# Patient Record
Sex: Female | Born: 1975 | Race: Black or African American | Hispanic: No | Smoking: Never smoker
Health system: Southern US, Community
[De-identification: ages and names within clinical notes are randomized; demographics above are authoritative.]

## PROBLEM LIST (undated history)

## (undated) DIAGNOSIS — F419 Anxiety disorder, unspecified: Secondary | ICD-10-CM

## (undated) DIAGNOSIS — K219 Gastro-esophageal reflux disease without esophagitis: Secondary | ICD-10-CM

## (undated) DIAGNOSIS — T7840XA Allergy, unspecified, initial encounter: Secondary | ICD-10-CM

## (undated) DIAGNOSIS — E785 Hyperlipidemia, unspecified: Secondary | ICD-10-CM

## (undated) DIAGNOSIS — F32A Depression, unspecified: Secondary | ICD-10-CM

## (undated) DIAGNOSIS — R011 Cardiac murmur, unspecified: Secondary | ICD-10-CM

## (undated) DIAGNOSIS — M1990 Unspecified osteoarthritis, unspecified site: Secondary | ICD-10-CM

## (undated) DIAGNOSIS — B009 Herpesviral infection, unspecified: Secondary | ICD-10-CM

## (undated) HISTORY — DX: Depression, unspecified: F32.A

## (undated) HISTORY — PX: ABDOMINAL HYSTERECTOMY: SHX81

## (undated) HISTORY — DX: Unspecified osteoarthritis, unspecified site: M19.90

## (undated) HISTORY — DX: Cardiac murmur, unspecified: R01.1

## (undated) HISTORY — PX: EYE SURGERY: SHX253

## (undated) HISTORY — DX: Gastro-esophageal reflux disease without esophagitis: K21.9

## (undated) HISTORY — DX: Anxiety disorder, unspecified: F41.9

## (undated) HISTORY — PX: PARTIAL HYSTERECTOMY: SHX80

## (undated) HISTORY — DX: Herpesviral infection, unspecified: B00.9

## (undated) HISTORY — PX: CHOLECYSTECTOMY: SHX55

## (undated) HISTORY — PX: TUBAL LIGATION: SHX77

## (undated) HISTORY — PX: OTHER SURGICAL HISTORY: SHX169

## (undated) HISTORY — DX: Allergy, unspecified, initial encounter: T78.40XA

## (undated) HISTORY — PX: TONSILLECTOMY: SUR1361

## (undated) HISTORY — PX: COSMETIC SURGERY: SHX468

## (undated) HISTORY — DX: Hyperlipidemia, unspecified: E78.5

---

## 2012-12-04 ENCOUNTER — Ambulatory Visit
Admission: RE | Admit: 2012-12-04 | Discharge: 2012-12-04 | Disposition: A | Discharge status: Home / Self Care | Payer: BC Managed Care – PPO | Source: Ambulatory Visit | Attending: Family | Admitting: Family

## 2012-12-04 ENCOUNTER — Other Ambulatory Visit: Payer: Self-pay

## 2012-12-04 ENCOUNTER — Other Ambulatory Visit: Payer: Self-pay | Admitting: Family

## 2012-12-04 DIAGNOSIS — Z1231 Encounter for screening mammogram for malignant neoplasm of breast: Secondary | ICD-10-CM

## 2012-12-04 DIAGNOSIS — T1490XA Injury, unspecified, initial encounter: Secondary | ICD-10-CM

## 2012-12-25 ENCOUNTER — Ambulatory Visit
Admission: RE | Admit: 2012-12-25 | Discharge: 2012-12-25 | Disposition: A | Discharge status: Home / Self Care | Payer: BC Managed Care – PPO | Source: Ambulatory Visit

## 2012-12-25 DIAGNOSIS — Z1231 Encounter for screening mammogram for malignant neoplasm of breast: Secondary | ICD-10-CM

## 2012-12-27 ENCOUNTER — Other Ambulatory Visit: Payer: Self-pay | Admitting: Family

## 2012-12-27 DIAGNOSIS — R928 Other abnormal and inconclusive findings on diagnostic imaging of breast: Secondary | ICD-10-CM

## 2013-01-19 ENCOUNTER — Ambulatory Visit
Admission: RE | Admit: 2013-01-19 | Discharge: 2013-01-19 | Disposition: A | Discharge status: Home / Self Care | Payer: BC Managed Care – PPO | Source: Ambulatory Visit | Attending: Family | Admitting: Family

## 2013-01-19 DIAGNOSIS — R928 Other abnormal and inconclusive findings on diagnostic imaging of breast: Secondary | ICD-10-CM

## 2013-07-02 DIAGNOSIS — N920 Excessive and frequent menstruation with regular cycle: Secondary | ICD-10-CM | POA: Insufficient documentation

## 2013-07-02 DIAGNOSIS — D219 Benign neoplasm of connective and other soft tissue, unspecified: Secondary | ICD-10-CM | POA: Insufficient documentation

## 2014-03-27 ENCOUNTER — Other Ambulatory Visit: Payer: Self-pay

## 2014-03-27 DIAGNOSIS — Z1231 Encounter for screening mammogram for malignant neoplasm of breast: Secondary | ICD-10-CM

## 2014-04-08 ENCOUNTER — Ambulatory Visit: Payer: Self-pay

## 2014-04-18 ENCOUNTER — Ambulatory Visit
Admission: RE | Admit: 2014-04-18 | Discharge: 2014-04-18 | Disposition: A | Discharge status: Home / Self Care | Payer: BLUE CROSS/BLUE SHIELD | Source: Ambulatory Visit

## 2014-04-18 ENCOUNTER — Other Ambulatory Visit: Payer: Self-pay

## 2014-04-18 DIAGNOSIS — Z1231 Encounter for screening mammogram for malignant neoplasm of breast: Secondary | ICD-10-CM

## 2015-08-06 ENCOUNTER — Other Ambulatory Visit: Payer: Self-pay | Admitting: Family

## 2015-08-06 DIAGNOSIS — Z1231 Encounter for screening mammogram for malignant neoplasm of breast: Secondary | ICD-10-CM

## 2015-08-18 ENCOUNTER — Ambulatory Visit
Admission: RE | Admit: 2015-08-18 | Discharge: 2015-08-18 | Disposition: A | Discharge status: Home / Self Care | Payer: BLUE CROSS/BLUE SHIELD | Source: Ambulatory Visit | Attending: Family | Admitting: Family

## 2015-08-18 DIAGNOSIS — Z1231 Encounter for screening mammogram for malignant neoplasm of breast: Secondary | ICD-10-CM

## 2015-08-21 ENCOUNTER — Other Ambulatory Visit: Payer: Self-pay | Admitting: Family

## 2015-08-21 DIAGNOSIS — R928 Other abnormal and inconclusive findings on diagnostic imaging of breast: Secondary | ICD-10-CM

## 2015-08-27 ENCOUNTER — Other Ambulatory Visit: Payer: Self-pay | Admitting: Family

## 2015-08-27 ENCOUNTER — Other Ambulatory Visit: Payer: Self-pay | Admitting: General Practice

## 2015-08-27 DIAGNOSIS — R928 Other abnormal and inconclusive findings on diagnostic imaging of breast: Secondary | ICD-10-CM

## 2015-08-28 ENCOUNTER — Ambulatory Visit
Admission: RE | Admit: 2015-08-28 | Discharge: 2015-08-28 | Disposition: A | Discharge status: Home / Self Care | Payer: BLUE CROSS/BLUE SHIELD | Source: Ambulatory Visit | Attending: Family | Admitting: Family

## 2015-08-28 DIAGNOSIS — R928 Other abnormal and inconclusive findings on diagnostic imaging of breast: Secondary | ICD-10-CM

## 2016-12-29 ENCOUNTER — Other Ambulatory Visit: Payer: Self-pay | Admitting: Family

## 2016-12-29 DIAGNOSIS — Z1231 Encounter for screening mammogram for malignant neoplasm of breast: Secondary | ICD-10-CM

## 2016-12-31 ENCOUNTER — Ambulatory Visit (INDEPENDENT_AMBULATORY_CARE_PROVIDER_SITE_OTHER): Payer: 59

## 2016-12-31 ENCOUNTER — Other Ambulatory Visit: Payer: Self-pay | Admitting: Podiatry

## 2016-12-31 ENCOUNTER — Ambulatory Visit: Payer: 59 | Admitting: Podiatry

## 2016-12-31 ENCOUNTER — Encounter: Payer: Self-pay | Admitting: Podiatry

## 2016-12-31 VITALS — BP 120/82 | HR 71

## 2016-12-31 DIAGNOSIS — M21619 Bunion of unspecified foot: Secondary | ICD-10-CM

## 2016-12-31 DIAGNOSIS — M2010 Hallux valgus (acquired), unspecified foot: Secondary | ICD-10-CM | POA: Diagnosis not present

## 2016-12-31 DIAGNOSIS — M79671 Pain in right foot: Secondary | ICD-10-CM

## 2016-12-31 DIAGNOSIS — M779 Enthesopathy, unspecified: Secondary | ICD-10-CM | POA: Diagnosis not present

## 2016-12-31 DIAGNOSIS — M79672 Pain in left foot: Secondary | ICD-10-CM

## 2016-12-31 MED ORDER — TRIAMCINOLONE ACETONIDE 10 MG/ML IJ SUSP
10.0000 mg | Freq: Once | INTRAMUSCULAR | Status: AC
  Administered 2016-12-31: 10 mg

## 2016-12-31 NOTE — Progress Notes (Signed)
Subjective:    Patient ID: Patricia French, female   DOB: 41 y.o.   MRN: [id number]   HPI patient presents with pain around the left big toe joint of several months duration and states it's been red on the inside and swollen. Patient is noted like to be active and is tried wider shoes and soaks    Review of Systems  All other systems reviewed and are negative.       Objective:  Physical Exam  Constitutional: She appears well-developed and well-nourished.  Cardiovascular: Intact distal pulses.  Pulmonary/Chest: Effort normal.  Musculoskeletal: Normal range of motion.  Neurological: She is alert.  Skin: Skin is warm.  Nursing note and vitals reviewed.  neurovascular status intact muscle strength adequate range of motion within normal limits with patient found to have inflammatory changes around the first MPJ left with fluid buildup around the joint surface and moderate structural changes left over right. 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 I do think this may require long-term structural bunion correction and that there is probably some nerve compression but today I'm in a try conservative and I injected around the first MPJ 3 mg Kenalog 5 mill grams Xylocaine and advised on wider shoes and reappoint for [place] to recheck  X-rays indicate elevation of the intermetatarsal angle left over right with indications of swelling around the first MPJ left

## 2016-12-31 NOTE — Progress Notes (Signed)
   Subjective:    Patient ID: Patricia French, female    DOB: [birth year], 40 y.o.   MRN: [id number]  HPI  Review of Systems  All other systems reviewed and are negative.      Objective:   Physical Exam        Assessment & Plan:

## 2016-12-31 NOTE — Patient Instructions (Signed)

## 2017-01-21 ENCOUNTER — Ambulatory Visit: Payer: 59 | Admitting: Podiatry

## 2017-01-27 ENCOUNTER — Ambulatory Visit: Payer: BLUE CROSS/BLUE SHIELD

## 2017-07-15 DIAGNOSIS — Z90710 Acquired absence of both cervix and uterus: Secondary | ICD-10-CM | POA: Insufficient documentation

## 2017-08-11 ENCOUNTER — Ambulatory Visit: Payer: 59 | Admitting: Podiatry

## 2017-08-11 ENCOUNTER — Encounter: Payer: Self-pay | Admitting: Podiatry

## 2017-08-11 ENCOUNTER — Ambulatory Visit (INDEPENDENT_AMBULATORY_CARE_PROVIDER_SITE_OTHER): Payer: 59

## 2017-08-11 ENCOUNTER — Other Ambulatory Visit: Payer: Self-pay | Admitting: Podiatry

## 2017-08-11 DIAGNOSIS — M2012 Hallux valgus (acquired), left foot: Secondary | ICD-10-CM

## 2017-08-11 DIAGNOSIS — M2010 Hallux valgus (acquired), unspecified foot: Secondary | ICD-10-CM

## 2017-08-11 DIAGNOSIS — M779 Enthesopathy, unspecified: Secondary | ICD-10-CM | POA: Diagnosis not present

## 2017-08-11 NOTE — Progress Notes (Signed)
Subjective:   Patient ID: Patricia French, female   DOB: 42 y.o.   MRN: [id number]   HPI Patient presents stating the bunion has really been bothering her on the left foot and it only responded temporarily to medication in the back of the Achilles tendon right has been bothering her and she had questions concerning it.  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  I did explain that keloiding can occur we will watch it carefully and may have to do injections or silicone sheeting we will do everything we can to avoid keloiding but most likely she will develop some form of this due to her history.  Patient understands this completely and wants surgery and at this point I did allow her to go over consent form reviewing the surgery and will be necessary.  Patient after extensive review signed consent form after reviewing alternative treatments complications scheduled for osteotomy distal left in about the next 4 to 6 weeks and is encouraged to call with any questions.  Understands total recovery take 6 months to 1 year and today I did go ahead and I dispensed a surgical boot but I want her to start wearing.  For the right will start exercises and stretching to try to help the Achilles tendon condition  X-rays indicate that there is elevation of the intermetatarsal angle left with  structural bunion deformity around the first metatarsal head left and no indications of spurring of the posterior right heel

## 2017-08-11 NOTE — Patient Instructions (Addendum)
Achilles Tendinitis  with Rehab Achilles tendinitis is a disorder of the Achilles tendon. The Achilles tendon connects the large calf muscles (Gastrocnemius and Soleus) to the heel bone (calcaneus). This tendon is sometimes called the heel cord. It is important for pushing-off and standing on your toes and is important for walking, running, or jumping. Tendinitis is often caused by overuse and repetitive microtrauma. SYMPTOMS  Pain, tenderness, swelling, warmth, and redness may occur over the Achilles tendon even at rest.  Pain with pushing off, or flexing or extending the ankle.  Pain that is worsened after or during activity. CAUSES   Overuse sometimes seen with rapid increase in exercise programs or in sports requiring running and jumping.  Poor physical conditioning (strength and flexibility or endurance).  Running sports, especially training running down hills.  Inadequate warm-up before practice or play or failure to stretch before participation.  Injury to the tendon. PREVENTION   Warm up and stretch before practice or competition.  Allow time for adequate rest and recovery between practices and competition.  Keep up conditioning.  Keep up ankle and leg flexibility.  Improve or keep muscle strength and endurance.  Improve cardiovascular fitness.  Use proper technique.  Use proper equipment (shoes, skates).  To help prevent recurrence, taping, protective strapping, or an adhesive bandage may be recommended for several weeks after healing is complete. PROGNOSIS   Recovery may take weeks to several months to heal.  Longer recovery is expected if symptoms have been prolonged.  Recovery is usually quicker if the inflammation is due to a direct blow as compared with overuse or sudden strain. RELATED COMPLICATIONS   Healing time will be prolonged if the condition is not correctly treated. The injury must be given plenty of time to heal.  Symptoms can reoccur if  activity is resumed too soon.  Untreated, tendinitis may increase the risk of tendon rupture requiring additional time for recovery and possibly surgery. TREATMENT   The first treatment consists of rest anti-inflammatory medication, and ice to relieve the pain.  Stretching and strengthening exercises after resolution of pain will likely help reduce the risk of recurrence. Referral to a physical therapist or athletic trainer for further evaluation and treatment may be helpful.  A walking boot or cast may be recommended to rest the Achilles tendon. This can help break the cycle of inflammation and microtrauma.  Arch supports (orthotics) may be prescribed or recommended by your caregiver as an adjunct to therapy and rest.  Surgery to remove the inflamed tendon lining or degenerated tendon tissue is rarely necessary and has shown less than predictable results. MEDICATION   Nonsteroidal anti-inflammatory medications, such as aspirin and ibuprofen, may be used for pain and inflammation relief. Do not take within 7 days before surgery. Take these as directed by your caregiver. Contact your caregiver immediately if any bleeding, stomach upset, or signs of allergic reaction occur. Other minor pain relievers, such as acetaminophen, may also be used.  Pain relievers may be prescribed as necessary by your caregiver. Do not take prescription pain medication for longer than 4 to 7 days. Use only as directed and only as much as you need.  Cortisone injections are rarely indicated. Cortisone injections may weaken tendons and predispose to rupture. It is better to give the condition more time to heal than to use them. HEAT AND COLD  Cold is used to relieve pain and reduce inflammation for acute and chronic Achilles tendinitis. Cold should be applied for 10 to 15 minutes   every 2 to 3 hours for inflammation and pain and immediately after any activity that aggravates your symptoms. Use ice packs or an ice  massage.  Heat may be used before performing stretching and strengthening activities prescribed by your caregiver. Use a heat pack or a warm soak. SEEK MEDICAL CARE IF:  Symptoms get worse or do not improve in 2 weeks despite treatment.  New, unexplained symptoms develop. Drugs used in treatment may produce side effects.  EXERCISES:  RANGE OF MOTION (ROM) AND STRETCHING EXERCISES - Achilles Tendinitis  These exercises may help you when beginning to rehabilitate your injury. Your symptoms may resolve with or without further involvement from your physician, physical therapist or athletic trainer. While completing these exercises, remember:   Restoring tissue flexibility helps normal motion to return to the joints. This allows healthier, less painful movement and activity.  An effective stretch should be held for at least 30 seconds.  A stretch should never be painful. You should only feel a gentle lengthening or release in the stretched tissue.  STRETCH  Gastroc, Standing   Place hands on wall.  Extend right / left leg, keeping the front knee somewhat bent.  Slightly point your toes inward on your back foot.  Keeping your right / left heel on the floor and your knee straight, shift your weight toward the wall, not allowing your back to arch.  You should feel a gentle stretch in the right / left calf. Hold this position for 10 seconds. Repeat 3 times. Complete this stretch 2 times per day.  STRETCH  Soleus, Standing   Place hands on wall.  Extend right / left leg, keeping the other knee somewhat bent.  Slightly point your toes inward on your back foot.  Keep your right / left heel on the floor, bend your back knee, and slightly shift your weight over the back leg so that you feel a gentle stretch deep in your back calf.  Hold this position for 10 seconds. Repeat 3 times. Complete this stretch 2 times per day.  STRETCH  Gastrocsoleus, Standing  Note: This exercise can place  a lot of stress on your foot and ankle. Please complete this exercise only if specifically instructed by your caregiver.   Place the ball of your right / left foot on a step, keeping your other foot firmly on the same step.  Hold on to the wall or a rail for balance.  Slowly lift your other foot, allowing your body weight to press your heel down over the edge of the step.  You should feel a stretch in your right / left calf.  Hold this position for 10 seconds.  Repeat this exercise with a slight bend in your knee. Repeat 3 times. Complete this stretch 2 times per day.   STRENGTHENING EXERCISES - Achilles Tendinitis These exercises may help you when beginning to rehabilitate your injury. They may resolve your symptoms with or without further involvement from your physician, physical therapist or athletic trainer. While completing these exercises, remember:   Muscles can gain both the endurance and the strength needed for everyday activities through controlled exercises.  Complete these exercises as instructed by your physician, physical therapist or athletic trainer. Progress the resistance and repetitions only as guided.  You may experience muscle soreness or fatigue, but the pain or discomfort you are trying to eliminate should never worsen during these exercises. If this pain does worsen, stop and make certain you are following the directions exactly. If   the pain is still present after adjustments, discontinue the exercise until you can discuss the trouble with your clinician.  STRENGTH - Plantar-flexors   Sit with your right / left leg extended. Holding onto both ends of a rubber exercise band/tubing, loop it around the ball of your foot. Keep a slight tension in the band.  Slowly push your toes away from you, pointing them downward.  Hold this position for 10 seconds. Return slowly, controlling the tension in the band/tubing. Repeat 3 times. Complete this exercise 2 times per day.     STRENGTH - Plantar-flexors   Stand with your feet shoulder width apart. Steady yourself with a wall or table using as little support as needed.  Keeping your weight evenly spread over the width of your feet, rise up on your toes.*  Hold this position for 10 seconds. Repeat 3 times. Complete this exercise 2 times per day.  *If this is too easy, shift your weight toward your right / left leg until you feel challenged. Ultimately, you may be asked to do this exercise with your right / left foot only.  STRENGTH  Plantar-flexors, Eccentric  Note: This exercise can place a lot of stress on your foot and ankle. Please complete this exercise only if specifically instructed by your caregiver.   Place the balls of your feet on a step. With your hands, use only enough support from a wall or rail to keep your balance.  Keep your knees straight and rise up on your toes.  Slowly shift your weight entirely to your right / left toes and pick up your opposite foot. Gently and with controlled movement, lower your weight through your right / left foot so that your heel drops below the level of the step. You will feel a slight stretch in the back of your calf at the end position.  Use the healthy leg to help rise up onto the balls of both feet, then lower weight only on the right / left leg again. Build up to 15 repetitions. Then progress to 3 consecutive sets of 15 repetitions.*  After completing the above exercise, complete the same exercise with a slight knee bend (about 30 degrees). Again, build up to 15 repetitions. Then progress to 3 consecutive sets of 15 repetitions.* Perform this exercise 2 times per day.  *When you easily complete 3 sets of 15, your physician, physical therapist or athletic trainer may advise you to add resistance by wearing a backpack filled with additional weight.  STRENGTH - Plantar Flexors, Seated   Sit on a chair that allows your feet to rest flat on the ground. If  necessary, sit at the edge of the chair.  Keeping your toes firmly on the ground, lift your right / left heel as far as you can without increasing any discomfort in your ankle. Repeat 3 times. Complete this exercise 2 times a day.    Bunion A bunion is a bump on the base of the big toe that forms when the bones of the big toe joint move out of position. Bunions may be small at first, but they often get larger over time. The can make walking painful. What are the causes? A bunion may be caused by:  Wearing narrow or pointed shoes that force the big toe to press against the other toes.  Abnormal foot development that causes the foot to roll inward (pronate).  Changes in the foot that are caused by certain diseases, such as rheumatoid arthritis and  polio.  A foot injury.  What increases the risk? The following factors may make you more likely to develop this condition:  Wearing shoes that squeeze the toes together.  Having certain diseases, such as: ? Rheumatoid arthritis. ? Polio. ? Cerebral palsy.  Having family members who have bunions.  Being born with a foot deformity, such as flat feet or low arches.  Doing activities that put a lot of pressure on the feet, such as ballet dancing.  What are the signs or symptoms? The main symptom of a bunion is a noticeable bump on the big toe. Other symptoms may include:  Pain.  Swelling around the big toe.  Redness and inflammation.  Thick or hardened skin on the big toe or between the toes.  Stiffness or loss of motion in the big toe.  Trouble with walking.  How is this diagnosed? A bunion may be diagnosed based on your symptoms, medical history, and activities. You may have tests, such as:  X-rays. These allow your health care provider to check the position of the bones in your foot and look for damage to your joint. They also help your health care provider to determine the severity of your bunion and the best way to treat  it.  Joint aspiration. In this test, a sample of fluid is removed from the toe joint. This test, which may be done if you are in a lot of pain, helps to rule out diseases that cause painful swelling of the joints, such as arthritis.  How is this treated? There is no cure for a bunion, but treatment can help to prevent a bunion from getting worse. Treatment depends on the severity of your symptoms. Your health care provider may recommend:  Wearing shoes that have a wide toe box.  Using bunion pads to cushion the affected area.  Taping your toes together to keep them in a normal position.  Placing a device inside your shoe (orthotics) to help reduce pressure on your toe joint.  Taking medicine to ease pain, inflammation, and swelling.  Applying heat or ice to the affected area.  Doing stretching exercises.  Surgery to remove scar tissue and move the toes back into their normal position. This treatment is rare.  Follow these instructions at home:  Support your toe joint with proper footwear, shoe padding, or taping as told by your health care provider.  Take over-the-counter and prescription medicines only as told by your health care provider.  If directed, apply ice to the injured area: ? Put ice in a plastic bag. ? Place a towel between your skin and the bag. ? Leave the ice on for 20 minutes, 2-3 times per day.  If directed, apply heat to the affected area before you exercise. Use the heat source that your health care provider recommends, such as a moist heat pack or a heating pad. ? Place a towel between your skin and the heat source. ? Leave the heat on for 20-30 minutes. ? Remove the heat if your skin turns bright red. This is especially important if you are unable to feel pain, heat, or cold. You may have a greater risk of getting burned.  Do exercises as told by your health care provider.  Keep all follow-up visits as told by your health care provider. Contact a health  care provider if:  Your symptoms get worse.  Your symptoms do not improve in 2 weeks. Get help right away if:  You have severe pain and  trouble with walking. This information is not intended to replace advice given to you by your health care provider. Make sure you discuss any questions you have with your health care provider. Document Released: 02/08/2005 Document Revised: 07/17/2015 Document Reviewed: 09/08/2014 Elsevier Interactive Patient Education  2018 [place] Instructions  Congratulations, you have decided to take an important step towards improving your quality of life.  You can be assured that the doctors and staff at [facility] will be with you every step of the way.  Here are some important things you should know:  1. Plan to be at the surgery center/hospital at least 1 (one) hour prior to your scheduled time, unless otherwise directed by the surgical center/hospital staff.  You must have a responsible adult accompany you, remain during the surgery and drive you home.  Make sure you have directions to the surgical center/hospital to ensure you arrive on time. 2. If you are having surgery at [facility] or [facility], you will need a copy of your medical history and physical form from your family physician within one month prior to the date of surgery. We will give you a form for your primary physician to complete.  3. We make every effort to accommodate the date you request for surgery.  However, there are times where surgery dates or times have to be moved.  We will contact you as soon as possible if a change in schedule is required.   4. No aspirin/ibuprofen for one week before surgery.  If you are on aspirin, any non-steroidal anti-inflammatory medications (Mobic, Aleve, Ibuprofen) should not be taken seven (7) days prior to your surgery.  You make take Tylenol for pain prior to surgery.  5. Medications - If you are taking daily heart and  blood pressure medications, seizure, reflux, allergy, asthma, anxiety, pain or diabetes medications, make sure you notify the surgery center/hospital before the day of surgery so they can tell you which medications you should take or avoid the day of surgery. 6. No food or drink after midnight the night before surgery unless directed otherwise by surgical center/hospital staff. 7. No alcoholic beverages 32-TFTDD prior to surgery.  No smoking 24-hours prior or 24-hours after surgery. 8. Wear loose pants or shorts. They should be loose enough to fit over bandages, boots, and casts. 9. Don't wear slip-on shoes. Sneakers are preferred. 10. Bring your boot with you to the surgery center/hospital.  Also bring crutches or a walker if your physician has prescribed it for you.  If you do not have this equipment, it will be provided for you after surgery. 11. If you have not been contacted by the surgery center/hospital by the day before your surgery, call to confirm the date and time of your surgery. 12. Leave-time from work may vary depending on the type of surgery you have.  Appropriate arrangements should be made prior to surgery with your employer. 13. Prescriptions will be provided immediately following surgery by your doctor.  Fill these as soon as possible after surgery and take the medication as directed. Pain medications will not be refilled on weekends and must be approved by the doctor. 14. Remove nail polish on the operative foot and avoid getting pedicures prior to surgery. 15. Wash the night before surgery.  The night before surgery wash the foot and leg well with water and the antibacterial soap provided. Be sure to pay special attention to beneath the toenails and in between the  toes.  Wash for at least three (3) minutes. Rinse thoroughly with water and dry well with a towel.  Perform this wash unless told not to do so by your physician.  Enclosed: 1 Ice pack (please put in freezer the night  before surgery)   1 Hibiclens skin cleaner   Pre-op instructions  If you have any questions regarding the instructions, please do not hesitate to call our office.  [place]: [address] [place], [place] [address] -- [place]: [address] [place], [place] [address] -- [phone]  [place]: [place], [place] [address] -- [phone]  [place]: [address], [place], [place], [place] [address] -- [phone]  Website: [email or web address].  Achilles Tendinitis  with Rehab Achilles tendinitis is a disorder of the Achilles tendon. The Achilles tendon connects the large calf muscles (Gastrocnemius and Soleus) to the heel bone (calcaneus). This tendon is sometimes called the heel cord. It is important for pushing-off and standing on your toes and is important for walking, running, or jumping. Tendinitis is often caused by overuse and repetitive microtrauma. SYMPTOMS  Pain, tenderness, swelling, warmth, and redness may occur over the Achilles tendon even at rest.  Pain with pushing off, or flexing or extending the ankle.  Pain that is worsened after or during activity. CAUSES   Overuse sometimes seen with rapid increase in exercise programs or in sports requiring running and jumping.  Poor physical conditioning (strength and flexibility or endurance).  Running sports, especially training running down hills.  Inadequate warm-up before practice or play or failure to stretch before participation.  Injury to the tendon. PREVENTION   Warm up and stretch before practice or competition.  Allow time for adequate rest and recovery between practices and competition.  Keep up conditioning.  Keep up ankle and leg flexibility.  Improve or keep muscle strength and endurance.  Improve cardiovascular fitness.  Use proper technique.  Use proper equipment (shoes, skates).  To help prevent recurrence, taping, protective strapping, or an  adhesive bandage may be recommended for several weeks after healing is complete. PROGNOSIS   Recovery may take weeks to several months to heal.  Longer recovery is expected if symptoms have been prolonged.  Recovery is usually quicker if the inflammation is due to a direct blow as compared with overuse or sudden strain. RELATED COMPLICATIONS   Healing time will be prolonged if the condition is not correctly treated. The injury must be given plenty of time to heal.  Symptoms can reoccur if activity is resumed too soon.  Untreated, tendinitis may increase the risk of tendon rupture requiring additional time for recovery and possibly surgery. TREATMENT   The first treatment consists of rest anti-inflammatory medication, and ice to relieve the pain.  Stretching and strengthening exercises after resolution of pain will likely help reduce the risk of recurrence. Referral to a physical therapist or athletic trainer for further evaluation and treatment may be helpful.  A walking boot or cast may be recommended to rest the Achilles tendon. This can help break the cycle of inflammation and microtrauma.  Arch supports (orthotics) may be prescribed or recommended by your caregiver as an adjunct to therapy and rest.  Surgery to remove the inflamed tendon lining or degenerated tendon tissue is rarely necessary and has shown less than predictable results. MEDICATION   Nonsteroidal anti-inflammatory medications, such as aspirin and ibuprofen, may be used for pain and inflammation relief. Do not take within 7 days before surgery. Take these  as directed by your caregiver. Contact your caregiver immediately if any bleeding, stomach upset, or signs of allergic reaction occur. Other minor pain relievers, such as acetaminophen, may also be used.  Pain relievers may be prescribed as necessary by your caregiver. Do not take prescription pain medication for longer than 4 to 7 days. Use only as directed and only  as much as you need.  Cortisone injections are rarely indicated. Cortisone injections may weaken tendons and predispose to rupture. It is better to give the condition more time to heal than to use them. HEAT AND COLD  Cold is used to relieve pain and reduce inflammation for acute and chronic Achilles tendinitis. Cold should be applied for 10 to 15 minutes every 2 to 3 hours for inflammation and pain and immediately after any activity that aggravates your symptoms. Use ice packs or an ice massage.  Heat may be used before performing stretching and strengthening activities prescribed by your caregiver. Use a heat pack or a warm soak. SEEK MEDICAL CARE IF:  Symptoms get worse or do not improve in 2 weeks despite treatment.  New, unexplained symptoms develop. Drugs used in treatment may produce side effects.  EXERCISES:  RANGE OF MOTION (ROM) AND STRETCHING EXERCISES - Achilles Tendinitis  These exercises may help you when beginning to rehabilitate your injury. Your symptoms may resolve with or without further involvement from your physician, physical therapist or athletic trainer. While completing these exercises, remember:   Restoring tissue flexibility helps normal motion to return to the joints. This allows healthier, less painful movement and activity.  An effective stretch should be held for at least 30 seconds.  A stretch should never be painful. You should only feel a gentle lengthening or release in the stretched tissue.  STRETCH  Gastroc, Standing   Place hands on wall.  Extend right / left leg, keeping the front knee somewhat bent.  Slightly point your toes inward on your back foot.  Keeping your right / left heel on the floor and your knee straight, shift your weight toward the wall, not allowing your back to arch.  You should feel a gentle stretch in the right / left calf. Hold this position for 10 seconds. Repeat 3 times. Complete this stretch 2 times per day.  STRETCH   Soleus, Standing   Place hands on wall.  Extend right / left leg, keeping the other knee somewhat bent.  Slightly point your toes inward on your back foot.  Keep your right / left heel on the floor, bend your back knee, and slightly shift your weight over the back leg so that you feel a gentle stretch deep in your back calf.  Hold this position for 10 seconds. Repeat 3 times. Complete this stretch 2 times per day.  STRETCH  Gastrocsoleus, Standing  Note: This exercise can place a lot of stress on your foot and ankle. Please complete this exercise only if specifically instructed by your caregiver.   Place the ball of your right / left foot on a step, keeping your other foot firmly on the same step.  Hold on to the wall or a rail for balance.  Slowly lift your other foot, allowing your body weight to press your heel down over the edge of the step.  You should feel a stretch in your right / left calf.  Hold this position for 10 seconds.  Repeat this exercise with a slight bend in your knee. Repeat 3 times. Complete this stretch 2  times per day.   STRENGTHENING EXERCISES - Achilles Tendinitis These exercises may help you when beginning to rehabilitate your injury. They may resolve your symptoms with or without further involvement from your physician, physical therapist or athletic trainer. While completing these exercises, remember:   Muscles can gain both the endurance and the strength needed for everyday activities through controlled exercises.  Complete these exercises as instructed by your physician, physical therapist or athletic trainer. Progress the resistance and repetitions only as guided.  You may experience muscle soreness or fatigue, but the pain or discomfort you are trying to eliminate should never worsen during these exercises. If this pain does worsen, stop and make certain you are following the directions exactly. If the pain is still present after adjustments,  discontinue the exercise until you can discuss the trouble with your clinician.  STRENGTH - Plantar-flexors   Sit with your right / left leg extended. Holding onto both ends of a rubber exercise band/tubing, loop it around the ball of your foot. Keep a slight tension in the band.  Slowly push your toes away from you, pointing them downward.  Hold this position for 10 seconds. Return slowly, controlling the tension in the band/tubing. Repeat 3 times. Complete this exercise 2 times per day.   STRENGTH - Plantar-flexors   Stand with your feet shoulder width apart. Steady yourself with a wall or table using as little support as needed.  Keeping your weight evenly spread over the width of your feet, rise up on your toes.*  Hold this position for 10 seconds. Repeat 3 times. Complete this exercise 2 times per day.  *If this is too easy, shift your weight toward your right / left leg until you feel challenged. Ultimately, you may be asked to do this exercise with your right / left foot only.  STRENGTH  Plantar-flexors, Eccentric  Note: This exercise can place a lot of stress on your foot and ankle. Please complete this exercise only if specifically instructed by your caregiver.   Place the balls of your feet on a step. With your hands, use only enough support from a wall or rail to keep your balance.  Keep your knees straight and rise up on your toes.  Slowly shift your weight entirely to your right / left toes and pick up your opposite foot. Gently and with controlled movement, lower your weight through your right / left foot so that your heel drops below the level of the step. You will feel a slight stretch in the back of your calf at the end position.  Use the healthy leg to help rise up onto the balls of both feet, then lower weight only on the right / left leg again. Build up to 15 repetitions. Then progress to 3 consecutive sets of 15 repetitions.*  After completing the above exercise,  complete the same exercise with a slight knee bend (about 30 degrees). Again, build up to 15 repetitions. Then progress to 3 consecutive sets of 15 repetitions.* Perform this exercise 2 times per day.  *When you easily complete 3 sets of 15, your physician, physical therapist or athletic trainer may advise you to add resistance by wearing a backpack filled with additional weight.  STRENGTH - Plantar Flexors, Seated   Sit on a chair that allows your feet to rest flat on the ground. If necessary, sit at the edge of the chair.  Keeping your toes firmly on the ground, lift your right / left heel as far  as you can without increasing any discomfort in your ankle. Repeat 3 times. Complete this exercise 2 times a day.

## 2017-09-30 ENCOUNTER — Telehealth: Payer: Self-pay | Admitting: *Deleted

## 2017-09-30 ENCOUNTER — Telehealth: Payer: Self-pay | Admitting: Podiatry

## 2017-09-30 NOTE — Telephone Encounter (Signed)
I just spoke to someone and I have a concern. I was thinking and I know you said you would call me back Monday but there is a penalty I thought I read somewhere to reschedule surgery so many hours prior to the date of surgery. I think it may be best for me to reschedule because it sounds like I'm going to be out longer than I was informed about and I just cannot do that right now with my job. I'm still getting my time approved, I had it all worked out. So if possible, can you please call me back today at [phone] and let me know. I need to avoid having to pay some type of penalty for the surgery as I cannot do that either. So if possible, please call me back today. Thank you.

## 2017-09-30 NOTE — Telephone Encounter (Signed)
I spoke with pt and she states she was concerned that Dr. Paulla Dolly had said she could go back to seated duties at work on the Monday 10/10/2017 after her 10/04/2017 surgery, and another lady said she could not go back until after seen at her 1st POV on 10/12/2017. I told pt as a general rule pts were not released until evaluated by Dr. Paulla Dolly unless they had sit down jobs and he had okayed previously. 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 I reminded pt she would have pain medications and she asked aren't they as needed. I told pt in most cases pts take the pain medications as ordered. I told her if she was on the pain medication, she could not drive, operate machinery or make legal decisions. Pt states she thinks she will be okay she had a hysterectomy and 3 kids. I told her then what Dr. Paulla Dolly had told her is what he felt would work for her. I asked how she would do if she found that she was too uncomfortable to work, would her employer accommodate her and she stated yes.

## 2017-09-30 NOTE — Telephone Encounter (Signed)
I need someone to call me back today and I do realize its late in the afternoon. I just talked to a nurse and I think I need to reschedule my surgery. I don't want to be penalized base on what she was telling me today, I received some misinformation somewhere along the line. Can you please call me back at [phone]. Thank you.

## 2017-10-03 NOTE — Telephone Encounter (Signed)
Patient called on Friday and left a message about how long would she be out of work from the surgery and I stated that you could be out for a while and the patient stated that Dr Paulla Dolly stated to her that she would be able to go back to work on Monday after the surgery and I stated that she would be out of work for at least a week for recovery and that the first post op visit was not till Wednesday August 21st and the patient stated that was not was told to her and I stated that I would check with Dr Paulla Dolly on Monday and would call patient back on Monday. Lattie Haw

## 2017-10-04 ENCOUNTER — Encounter: Payer: Self-pay | Admitting: Podiatry

## 2017-10-04 DIAGNOSIS — M2012 Hallux valgus (acquired), left foot: Secondary | ICD-10-CM

## 2017-10-12 ENCOUNTER — Ambulatory Visit (INDEPENDENT_AMBULATORY_CARE_PROVIDER_SITE_OTHER): Payer: 59 | Admitting: Podiatry

## 2017-10-12 ENCOUNTER — Ambulatory Visit (INDEPENDENT_AMBULATORY_CARE_PROVIDER_SITE_OTHER): Payer: 59

## 2017-10-12 VITALS — BP 126/79 | HR 85 | Temp 98.2°F

## 2017-10-12 DIAGNOSIS — M2012 Hallux valgus (acquired), left foot: Secondary | ICD-10-CM

## 2017-10-12 DIAGNOSIS — M2010 Hallux valgus (acquired), unspecified foot: Secondary | ICD-10-CM | POA: Diagnosis not present

## 2017-10-12 NOTE — Progress Notes (Signed)
Subjective:   Patient ID: Patricia French, female   DOB: 42 y.o.   MRN: [id number]   HPI Patient presents stating doing well but admits she did walk around the house with out any form of immobilization   ROS      Objective:  Physical Exam  Neurovascular status intact muscle strength is adequate patient's left foot healing well clinically wound edges well coapted hallux in rectus position with good range of motion and minimal discomfort.  Patient was relatively noncompliant by walking around without her immobilization but other than that appears to be healing well     Assessment:  Doing well overall post osteotomy first metatarsal left with mild increase in activity beyond what we had hoped for     Plan:  Reviewed x-rays and discussed there may be slight movement dorsally but I think it will be stable at this position but is very important she does not walk on this foot without complete immobilization.  Patient will continue with elevation compression range of motion be seen back 2 weeks or earlier if needed  X-ray indicates the osteotomy overall is healing well with excellent correction of the IM angle but there is slight dorsiflexion probably due to activity and I do want to keep a little closer eye on this even though I think it would be stable at this position.  Reappoint 2 weeks or earlier if any issues occur

## 2017-10-17 ENCOUNTER — Telehealth: Payer: Self-pay | Admitting: Podiatry

## 2017-10-17 DIAGNOSIS — Z9889 Other specified postprocedural states: Secondary | ICD-10-CM

## 2017-10-17 DIAGNOSIS — M2010 Hallux valgus (acquired), unspecified foot: Secondary | ICD-10-CM

## 2017-10-17 NOTE — Telephone Encounter (Signed)
I'm having sharp pains that are shooting through my big toe. I don't know if that is normal or what? I had a bunionectomy two weeks ago. If someone could please call me back at [phone]. Thank you.

## 2017-10-17 NOTE — Telephone Encounter (Signed)
Pt states she is having a sharp pain in her big toe, just happens no activity involved. I told pt that was not unusual, that she may be more active than she thought. I told pt to back off on the activity. I asked pt what she was using for pain and she stated ibuprofen as needed. I told pt she could use ibuprofen 800mg  3 times a day and it would help with the pain. Pt asked for a knee scooter, she is going out of town, and I told her I would order one from [facility] and the would contact her with coverage and cost.

## 2017-10-17 NOTE — Addendum Note (Signed)
Addended by: Harriett Sine D on: 10/17/2017 02:38 PM   Modules accepted: Orders

## 2017-10-17 NOTE — Telephone Encounter (Signed)
Faxed orders to [facility] and emailed order to [facility].

## 2017-10-20 ENCOUNTER — Telehealth: Payer: Self-pay | Admitting: Podiatry

## 2017-10-20 NOTE — Telephone Encounter (Signed)
I spoke to a nurse the other day and she said she was going to have someone from [facility] call me about getting a piece of equipment that we are taking. We are leaving tomorrow and I have not been contacted by anyone. If someone could give me a call today at [phone]. Thank you.

## 2017-10-20 NOTE — Telephone Encounter (Signed)
Emailed request for knee scooter to [facility].

## 2017-10-20 NOTE — Telephone Encounter (Signed)
I spoke with pt and she states [facility] just called about the knee scooter.

## 2017-10-27 ENCOUNTER — Encounter: Payer: Self-pay | Admitting: Podiatry

## 2017-10-27 ENCOUNTER — Ambulatory Visit (INDEPENDENT_AMBULATORY_CARE_PROVIDER_SITE_OTHER): Payer: 59 | Admitting: Podiatry

## 2017-10-27 ENCOUNTER — Ambulatory Visit (INDEPENDENT_AMBULATORY_CARE_PROVIDER_SITE_OTHER): Payer: 59

## 2017-10-27 DIAGNOSIS — M2012 Hallux valgus (acquired), left foot: Secondary | ICD-10-CM

## 2017-10-27 DIAGNOSIS — Z9889 Other specified postprocedural states: Secondary | ICD-10-CM

## 2017-10-27 DIAGNOSIS — M2010 Hallux valgus (acquired), unspecified foot: Secondary | ICD-10-CM

## 2017-12-01 ENCOUNTER — Ambulatory Visit (INDEPENDENT_AMBULATORY_CARE_PROVIDER_SITE_OTHER): Payer: 59 | Admitting: Podiatry

## 2017-12-01 ENCOUNTER — Encounter: Payer: Self-pay | Admitting: Podiatry

## 2017-12-01 ENCOUNTER — Ambulatory Visit (INDEPENDENT_AMBULATORY_CARE_PROVIDER_SITE_OTHER): Payer: 59

## 2017-12-01 DIAGNOSIS — M2012 Hallux valgus (acquired), left foot: Secondary | ICD-10-CM | POA: Diagnosis not present

## 2017-12-01 DIAGNOSIS — M2010 Hallux valgus (acquired), unspecified foot: Secondary | ICD-10-CM

## 2017-12-02 NOTE — Progress Notes (Signed)
Subjective:   Patient ID: Patricia French, female   DOB: 42 y.o.   MRN: [id number]   HPI Patient states overall doing very well with mild discomfort if I been on my foot too long   ROS      Objective:  Physical Exam  Neurovascular status intact with patient's left foot doing well with mild discomfort still upon deep palpation but overall improved quite nicely from previous visit with good range of motion and no crepitus     Assessment:  Doing well post osteotomy first metatarsal left with some movement she had previously secondary to patient walking on her foot without any immobilization the first week after procedure     Plan:  Reviewed x-ray and advised on continued compression elevation and rigid bottom shoes and gradual increase in activities and reappoint 4 weeks or earlier if needed  X-ray indicated that there is some movement of the first metatarsal but it is stable from previous visit with fixation in place and this did occur secondary to noncompliance the patient the first week after procedure

## 2017-12-29 ENCOUNTER — Ambulatory Visit (INDEPENDENT_AMBULATORY_CARE_PROVIDER_SITE_OTHER): Payer: 59 | Admitting: Podiatry

## 2017-12-29 ENCOUNTER — Encounter: Payer: Self-pay | Admitting: Podiatry

## 2017-12-29 ENCOUNTER — Ambulatory Visit (INDEPENDENT_AMBULATORY_CARE_PROVIDER_SITE_OTHER): Payer: 59

## 2017-12-29 DIAGNOSIS — M2010 Hallux valgus (acquired), unspecified foot: Secondary | ICD-10-CM

## 2017-12-29 DIAGNOSIS — M2012 Hallux valgus (acquired), left foot: Secondary | ICD-10-CM | POA: Diagnosis not present

## 2018-01-01 NOTE — Progress Notes (Signed)
Subjective:   Patient ID: Patricia French, female   DOB: 42 y.o.   MRN: [id number]   HPI Patient presents stating I am doing real well with surgery with a little bit of pain overall I am extremely pleased with my surgery of 3 months ago   ROS      Objective:  Physical Exam  Neurovascular status was intact negative Homans sign noted with good range of motion first MPJ left with wound edges well coapted and no crepitus of the joint     Assessment:  Doing well post correction of first metatarsal left     Plan:  H&P final x-ray reviewed and allow patient to return to normal activity.  Patient will be seen back for review in the next 12 weeks or earlier if symptoms should occur and patient is advised to increase activity levels  X-rays indicate that the osteotomy is healing well with good alignment fixation in place

## 2018-02-02 ENCOUNTER — Encounter: Payer: Self-pay | Admitting: Podiatry

## 2018-02-02 ENCOUNTER — Ambulatory Visit: Payer: 59 | Admitting: Podiatry

## 2018-02-02 ENCOUNTER — Ambulatory Visit (INDEPENDENT_AMBULATORY_CARE_PROVIDER_SITE_OTHER): Payer: 59

## 2018-02-02 VITALS — BP 140/89 | HR 79 | Temp 98.7°F | Resp 16

## 2018-02-02 DIAGNOSIS — Z9889 Other specified postprocedural states: Secondary | ICD-10-CM

## 2018-02-02 DIAGNOSIS — M2010 Hallux valgus (acquired), unspecified foot: Secondary | ICD-10-CM

## 2018-02-02 DIAGNOSIS — M2012 Hallux valgus (acquired), left foot: Secondary | ICD-10-CM | POA: Diagnosis not present

## 2018-02-03 NOTE — Progress Notes (Signed)
Subjective:   Patient ID: Patricia French, female   DOB: 42 y.o.   MRN: [id number]   HPI Patient states doing really well with minimal discomfort good range of motion and wound edges are well coapted with slight thickening the incision with history of keloids on her stomach   ROS      Objective:  Physical Exam  Neurovascular status intact patient doing well with no discomfort noted within the first MPJ and very mild thickness the incision     Assessment:  Doing well post osteotomy first metatarsal left     Plan:  Final x-ray is viewed and at this point we may consider some form of steroid injection to try to thin out the scarring but at this point is not hurting so we will just watch it and give it a couple more months and if it is a problem she will reappoint.  She is discharged and that she needs to be seen back and can return to normal activity  X-ray indicates osteotomies healing well fixation in place no signs of significant pathology

## 2018-04-03 DIAGNOSIS — E7800 Pure hypercholesterolemia, unspecified: Secondary | ICD-10-CM | POA: Insufficient documentation

## 2018-04-03 DIAGNOSIS — E559 Vitamin D deficiency, unspecified: Secondary | ICD-10-CM | POA: Insufficient documentation

## 2018-04-14 ENCOUNTER — Telehealth: Payer: Self-pay | Admitting: *Deleted

## 2018-04-14 NOTE — Telephone Encounter (Signed)
Pt states she has a flat mole with pointed area on the bottom of the foot, had it cut out, but is having pain now at the surgery site. I told pt that if the pain was at the surgery site she needed to return to the doctor that cut it out. Pt then stated surgery was 10 years ago. I told pt then Dr. Paulla Dolly could evaluate and if necessary treat or refer to the dermatologist. I transferred pt to scheduler.

## 2018-04-14 NOTE — Telephone Encounter (Signed)
Left message informing pt I was returning her call.

## 2018-04-14 NOTE — Telephone Encounter (Signed)
Pt states she is calling concerning a pain she is having in her foot and did she need to see Dr. Paulla Dolly or another doctor.

## 2018-04-14 NOTE — Telephone Encounter (Signed)
Pt called she states she has an question.

## 2018-04-14 NOTE — Telephone Encounter (Signed)
Left message informing pt that Dr. Paulla Dolly specialized in any problem or disease of the foot and ankle, to call our appt line for an appt.

## 2018-04-20 ENCOUNTER — Ambulatory Visit: Payer: 59 | Admitting: Podiatry

## 2018-05-10 ENCOUNTER — Ambulatory Visit: Payer: 59 | Admitting: Podiatry

## 2018-06-07 ENCOUNTER — Ambulatory Visit: Payer: 59 | Admitting: Podiatry

## 2018-09-06 ENCOUNTER — Ambulatory Visit: Payer: 59 | Admitting: Podiatry

## 2019-01-17 ENCOUNTER — Other Ambulatory Visit: Payer: Self-pay

## 2019-01-17 DIAGNOSIS — Z20822 Contact with and (suspected) exposure to covid-19: Secondary | ICD-10-CM

## 2019-01-19 LAB — NOVEL CORONAVIRUS, NAA: SARS-CoV-2, NAA: NOT DETECTED

## 2019-10-30 DIAGNOSIS — M2669 Other specified disorders of temporomandibular joint: Secondary | ICD-10-CM | POA: Insufficient documentation

## 2019-10-30 DIAGNOSIS — G44209 Tension-type headache, unspecified, not intractable: Secondary | ICD-10-CM | POA: Insufficient documentation

## 2020-01-08 ENCOUNTER — Ambulatory Visit: Payer: Self-pay | Attending: Internal Medicine

## 2020-01-08 DIAGNOSIS — Z23 Encounter for immunization: Secondary | ICD-10-CM

## 2020-01-08 NOTE — Progress Notes (Signed)
   Covid-19 Vaccination Clinic  Name:  Patricia French    MRN: [id number] DOB: 1975/12/28  01/08/2020  [name] was observed post Covid-19 immunization for 15 minutes without incident. She was provided with Vaccine Information Sheet and instruction to access the V-Safe system.   [name] was instructed to call 911 with any severe reactions post vaccine: Marland Kitchen Difficulty breathing  . Swelling of face and throat  . A fast heartbeat  . A bad rash all over body  . Dizziness and weakness   Immunizations Administered    No immunizations on file.

## 2020-06-04 ENCOUNTER — Ambulatory Visit: Payer: 59 | Admitting: Nurse Practitioner

## 2020-06-04 ENCOUNTER — Other Ambulatory Visit: Payer: Self-pay

## 2020-06-04 ENCOUNTER — Encounter: Payer: Self-pay | Admitting: Nurse Practitioner

## 2020-06-04 VITALS — BP 132/82 | HR 84 | Temp 98.8°F | Ht 63.5 in | Wt 180.0 lb

## 2020-06-04 DIAGNOSIS — Z1211 Encounter for screening for malignant neoplasm of colon: Secondary | ICD-10-CM | POA: Diagnosis not present

## 2020-06-04 DIAGNOSIS — F419 Anxiety disorder, unspecified: Secondary | ICD-10-CM

## 2020-06-04 DIAGNOSIS — M79605 Pain in left leg: Secondary | ICD-10-CM | POA: Diagnosis not present

## 2020-06-04 DIAGNOSIS — M79604 Pain in right leg: Secondary | ICD-10-CM

## 2020-06-04 DIAGNOSIS — Z7689 Persons encountering health services in other specified circumstances: Secondary | ICD-10-CM

## 2020-06-04 MED ORDER — BUPROPION HCL ER (SR) 100 MG PO TB12: 100.0000 mg | ORAL_TABLET | Freq: Two times a day (BID) | ORAL | 2 refills | Status: DC

## 2020-06-04 MED ORDER — MAGNESIUM 250 MG PO TABS: 1.0000 | ORAL_TABLET | Freq: Every day | ORAL | 1 refills | Status: AC

## 2020-06-04 MED ORDER — BUPROPION HCL ER (SR) 100 MG PO TB12: 100.0000 mg | ORAL_TABLET | Freq: Every day | ORAL | 2 refills | Status: DC

## 2020-06-04 NOTE — Progress Notes (Signed)
I,Yamilka Roman [employer] as a Education administrator for [employer], FNP.,have documented all relevant documentation on the behalf of [name], FNP,as directed by  [name], FNP while in the presence of [name], [place]. This visit occurred during the SARS-CoV-2 public health emergency.  Safety protocols were in place, including screening questions prior to the visit, additional usage of staff PPE, and extensive cleaning of exam room while observing appropriate contact time as indicated for disinfecting solutions.  Subjective:     Patient ID: Patricia French , female    DOB: 1975-11-18 , 45 y.o.   MRN: [id number]   Chief Complaint  Patient presents with  . Establish Care  . Back Pain  . Leg Pain    HPI  Patient presents today to establish primary care. She would like to be evaluated for back and leg pain.  She had been at [facility] with Priscille Loveless FNP.  She works as a Teacher, music. Divorced. 69, 68 y/o (girls), and 16 y/o (girls).    PMH - anxiety, partial hysterectomy [facility])   Father - deceased - Alzheimer's. Maternal grandmother - breast cancer. 3 of her paternal aunts have alzheimers. She is in a study at [employer] for Alzheimers. She has an older sister with behavioral health problems.   She had been taking xanax daily until being prescribed Lexapro. She stopped taking due to having twitching of her left shoulder. She did notice it helped with her mood and she was feeling better. She was prescribed 10 mg daily but only took 1/2 tablet. She reports the twitching stopped after stopping. She took for 6 weeks.   Back Pain This is a new problem. The current episode started in the past 7 days. The problem occurs intermittently. The pain is present in the lumbar spine. Associated symptoms include leg pain (worse at night). Pertinent negatives include no chest pain or headaches.  Leg Pain  The quality of the pain is described as aching. Exacerbated by:  laying down at night. She has tried NSAIDs for the symptoms.  Anxiety Presents for initial visit. Patient reports no chest pain, dizziness or palpitations.       Past Medical History:  Diagnosis Date  . HSV-2 (herpes simplex virus 2) infection   . Hyperlipidemia      Family History  Problem Relation Age of Onset  . Diabetes Mother   . Hypertension Mother   . Hyperlipidemia Mother   . Heart disease Mother   . Varicose Veins Mother   . Anxiety disorder Mother   . Heart attack Father   . Bipolar disorder Father   . Schizophrenia Father   . Hyperlipidemia Father   . Alzheimer's disease Father   . Cancer Maternal Grandmother   . Migraines Maternal Grandmother   . Hypertension Maternal Grandmother   . Alzheimer's disease Paternal Grandmother   . Leukemia Paternal Grandfather      Current Outpatient Medications:  .  ALPRAZolam (XANAX) 0.25 MG tablet, Take 0.25 mg at bedtime as needed by mouth for anxiety., Disp: , Rfl:  .  Ascorbic Acid (VITAMIN C) 1000 MG tablet, Take by mouth., Disp: , Rfl:  .  estradiol (VIVELLE-DOT) 0.075 MG/24HR, Place 1 patch onto the skin daily at 6 (six) AM., Disp: , Rfl:  .  Magnesium 250 MG TABS, Take 1 tablet (250 mg total) by mouth daily. Take with evening meal, Disp: 90 tablet, Rfl: 1 .  rosuvastatin (CRESTOR) 5 MG tablet, Take 5 mg by  mouth daily at 6 (six) AM., Disp: , Rfl:  .  valACYclovir (VALTREX) 500 MG tablet, Take 500 mg by mouth daily at 6 (six) AM., Disp: , Rfl:  .  Vitamin D, Ergocalciferol, (DRISDOL) 1.25 MG (50000 UNIT) CAPS capsule, Take 1 capsule by mouth daily at 6 (six) AM., Disp: , Rfl:  .  buPROPion (WELLBUTRIN SR) 100 MG 12 hr tablet, Take 1 tablet (100 mg total) by mouth daily., Disp: 30 tablet, Rfl: 2   Allergies  Allergen Reactions  . Penicillins Hives  . Pork-Derived Products      Review of Systems  Constitutional: Negative.   Respiratory: Negative.  Negative for wheezing.   Cardiovascular: Negative.  Negative for  chest pain, palpitations and leg swelling.  Musculoskeletal: Positive for arthralgias (leg pain) and back pain.  Neurological: Negative.  Negative for dizziness and headaches.  Psychiatric/Behavioral: Negative.      Today's Vitals   06/04/20 1502  BP: 132/82  Pulse: 84  Temp: 98.8 F (37.1 C)  TempSrc: Oral  Weight: 180 lb (81.6 kg)  Height: 5' 3.5" (1.613 m)  PainSc: 4   PainLoc: Leg   Body mass index is 31.39 kg/m.   Objective:  Physical Exam Constitutional:      General: She is not in acute distress.    Appearance: Normal appearance.  Cardiovascular:     Rate and Rhythm: Normal rate and regular rhythm.     Pulses: Normal pulses.     Heart sounds: Normal heart sounds. No murmur heard.   Pulmonary:     Effort: Pulmonary effort is normal. No respiratory distress.     Breath sounds: Normal breath sounds. No wheezing.  Musculoskeletal:        General: No swelling or tenderness. Normal range of motion.     Right lower leg: No edema.     Left lower leg: No edema.  Skin:    Capillary Refill: Capillary refill takes less than 2 seconds.  Neurological:     General: No focal deficit present.     Mental Status: She is alert and oriented to person, place, and time.     Cranial Nerves: No cranial nerve deficit.  Psychiatric:        Mood and Affect: Mood normal.        Behavior: Behavior normal.        Thought Content: Thought content normal.        Judgment: Judgment normal.         Assessment And Plan:     1. Anxiety Will try her on wellbutrin, hopefully this helps with sleep and anxiety Discussed side effect profile to include risk for seizures - buPROPion (WELLBUTRIN SR) 100 MG 12 hr tablet; Take 1 tablet (100 mg total) by mouth daily.  Dispense: 30 tablet; Refill: 2 - BMP8+eGFR  2. Pain in both lower extremities  Will check vitamin b12 and encouraged to take magnesium at evening meal - Magnesium 250 MG TABS; Take 1 tablet (250 mg total) by mouth daily. Take  with evening meal  Dispense: 90 tablet; Refill: 1 - Vitamin B12  3. Establishing care with new doctor, encounter for  4. Encounter for screening colonoscopy  According to USPTF Colorectal cancer Screening guidelines. Colonoscopy is recommended every 10 years, starting at age 76 years.  Will refer to GI for colon cancer screening. - Ambulatory referral to Gastroenterology     Patient was given opportunity to ask questions. Patient verbalized understanding of the plan and was able  to repeat key elements of the plan. All questions were answered to their satisfaction.  [name], FNP   I, [name], FNP, have reviewed all documentation for this visit. The documentation on 06/04/20 for the exam, diagnosis, procedures, and orders are all accurate and complete.   IF YOU HAVE BEEN REFERRED TO A SPECIALIST, IT MAY TAKE 1-2 WEEKS TO SCHEDULE/PROCESS THE REFERRAL. IF YOU HAVE NOT HEARD FROM US/SPECIALIST IN TWO WEEKS, PLEASE GIVE [place] A CALL AT [phone].   THE PATIENT IS ENCOURAGED TO PRACTICE SOCIAL DISTANCING DUE TO THE COVID-19 PANDEMIC.

## 2020-06-05 LAB — BMP8+EGFR
BUN/Creatinine Ratio: 14 (ref 9–23)
BUN: 9 mg/dL (ref 6–24)
CO2: 22 mmol/L (ref 20–29)
Calcium: 9.8 mg/dL (ref 8.7–10.2)
Chloride: 101 mmol/L (ref 96–106)
Creatinine, Ser: 0.63 mg/dL (ref 0.57–1.00)
Glucose: 76 mg/dL (ref 65–99)
Potassium: 4.2 mmol/L (ref 3.5–5.2)
Sodium: 144 mmol/L (ref 134–144)
eGFR: 111 mL/min/{1.73_m2} (ref >59)

## 2020-06-05 LAB — VITAMIN B12: Vitamin B-12: 955 pg/mL (ref 232–1245)

## 2020-06-30 ENCOUNTER — Encounter: Payer: Self-pay | Admitting: Nurse Practitioner

## 2020-07-17 ENCOUNTER — Ambulatory Visit: Payer: 59 | Admitting: Nurse Practitioner

## 2020-08-12 ENCOUNTER — Other Ambulatory Visit: Payer: Self-pay

## 2020-08-12 ENCOUNTER — Encounter: Payer: Self-pay | Admitting: Nurse Practitioner

## 2020-08-12 ENCOUNTER — Ambulatory Visit: Payer: 59 | Admitting: Nurse Practitioner

## 2020-08-12 VITALS — BP 122/80 | HR 71 | Temp 98.1°F | Ht 63.5 in | Wt 182.6 lb

## 2020-08-12 DIAGNOSIS — M25512 Pain in left shoulder: Secondary | ICD-10-CM | POA: Diagnosis not present

## 2020-08-12 DIAGNOSIS — G8929 Other chronic pain: Secondary | ICD-10-CM

## 2020-08-12 DIAGNOSIS — F419 Anxiety disorder, unspecified: Secondary | ICD-10-CM | POA: Diagnosis not present

## 2020-08-12 MED ORDER — PREDNISONE 10 MG (21) PO TBPK: ORAL_TABLET | ORAL | 0 refills | Status: DC

## 2020-08-12 NOTE — Progress Notes (Signed)
I,Yamilka Roman [employer] as a Education administrator for [employer], FNP.,have documented all relevant documentation on the behalf of [name], FNP,as directed by  [name], FNP while in the presence of [name], [place].   This visit occurred during the SARS-CoV-2 public health emergency.  Safety protocols were in place, including screening questions prior to the visit, additional usage of staff PPE, and extensive cleaning of exam room while observing appropriate contact time as indicated for disinfecting solutions.  Subjective:     Patient ID: Patricia French , female    DOB: [birth year] , 45 y.o.   MRN: [id number]   Chief Complaint  Patient presents with   Anxiety   Shoulder Pain    HPI  Patient presents today for a f/u on anxiety.  She took one pill of the wellbutrin and stopped due to her shoulder jumping. She is concerned about tardive dyskinesia. She has only taken xanax 2 times since her last visit. She does go to counseling.   Three years ago had shoulder problems to both, had injections and now having left shoulder pain. Never had any xrays.   Anxiety Presents for initial visit. Onset was 6 to 12 months ago. Patient reports no chest pain, dizziness or palpitations.    Back Pain This is a new problem. The current episode started in the past 7 days. The problem occurs intermittently. The pain is present in the lumbar spine. Associated symptoms include leg pain (worse at night). Pertinent negatives include no chest pain or headaches.  Leg Pain  The quality of the pain is described as aching. Exacerbated by: laying down at night. She has tried NSAIDs for the symptoms.    Past Medical History:  Diagnosis Date   HSV-2 (herpes simplex virus 2) infection    Hyperlipidemia      Family History  Problem Relation Age of Onset   Diabetes Mother    Hypertension Mother    Hyperlipidemia Mother    Heart disease Mother    Varicose Veins Mother    Anxiety disorder Mother    Heart  attack Father    Bipolar disorder Father    Schizophrenia Father    Hyperlipidemia Father    Alzheimer's disease Father    Cancer Maternal Grandmother    Migraines Maternal Grandmother    Hypertension Maternal Grandmother    Alzheimer's disease Paternal Grandmother    Leukemia Paternal Grandfather      Current Outpatient Medications:    ALPRAZolam (XANAX) 0.25 MG tablet, Take 0.25 mg at bedtime as needed by mouth for anxiety., Disp: , Rfl:    Ascorbic Acid (VITAMIN C) 1000 MG tablet, Take by mouth., Disp: , Rfl:    buPROPion (WELLBUTRIN SR) 100 MG 12 hr tablet, Take 1 tablet (100 mg total) by mouth daily., Disp: 30 tablet, Rfl: 2   estradiol (VIVELLE-DOT) 0.075 MG/24HR, Place 1 patch onto the skin daily at 6 (six) AM., Disp: , Rfl:    Magnesium 250 MG TABS, Take 1 tablet (250 mg total) by mouth daily. Take with evening meal, Disp: 90 tablet, Rfl: 1   predniSONE (STERAPRED UNI-PAK 21 TAB) 10 MG (21) TBPK tablet, Take as directed, Disp: 21 tablet, Rfl: 0   rosuvastatin (CRESTOR) 5 MG tablet, Take 5 mg by mouth daily at 6 (six) AM., Disp: , Rfl:    valACYclovir (VALTREX) 500 MG tablet, Take 500 mg by mouth daily at 6 (six) AM., Disp: , Rfl:    Vitamin D, Ergocalciferol, (DRISDOL) 1.25 MG (50000 UNIT)  CAPS capsule, Take 1 capsule by mouth daily at 6 (six) AM., Disp: , Rfl:    Allergies  Allergen Reactions   Penicillins Hives   Pork-Derived Products      Review of Systems  Constitutional: Negative.   Respiratory: Negative.    Cardiovascular:  Negative for chest pain and palpitations.  Musculoskeletal:  Positive for back pain.       Left shoulder pain  Neurological:  Negative for dizziness and headaches.  Psychiatric/Behavioral: Negative.         Stopped taking wellbutrin due to concerns of tardive dykinesia    Today's Vitals   08/12/20 1609  BP: 122/80  Pulse: 71  Temp: 98.1 F (36.7 C)  Weight: 182 lb 9.6 oz (82.8 kg)  Height: 5' 3.5" (1.613 m)   Body mass index is 31.84  kg/m.   Objective:  Physical Exam Vitals reviewed.  Constitutional:      General: She is not in acute distress.    Appearance: Normal appearance.  Cardiovascular:     Pulses: Normal pulses.     Heart sounds: Normal heart sounds. No murmur heard. Pulmonary:     Effort: Pulmonary effort is normal. No respiratory distress.     Breath sounds: Normal breath sounds. No wheezing.  Musculoskeletal:        General: Tenderness (left anterior bursa of shoulder) present. No swelling. Normal range of motion.     Right lower leg: No edema.     Left lower leg: No edema.     Comments: No pain on palpation to back  Skin:    General: Skin is warm and dry.     Capillary Refill: Capillary refill takes less than 2 seconds.  Neurological:     General: No focal deficit present.     Mental Status: She is alert and oriented to person, place, and time.  Psychiatric:        Mood and Affect: Mood normal.        Behavior: Behavior normal.        Thought Content: Thought content normal.        Judgment: Judgment normal.        Assessment And Plan:     1. Anxiety She is no longer taking wellbutrin due to concerns of tardive dyskinesia No additional changes at this time  2. Chronic left shoulder pain Tenderness to anterior bursa area, will treat with steroid dose pack - DG Shoulder Left; Future - predniSONE (STERAPRED UNI-PAK 21 TAB) 10 MG (21) TBPK tablet; Take as directed  Dispense: 21 tablet; Refill: 0     Patient was given opportunity to ask questions. Patient verbalized understanding of the plan and was able to repeat key elements of the plan. All questions were answered to their satisfaction.  [name], FNP   I, [name], FNP, have reviewed all documentation for this visit. The documentation on 09/14/20 for the exam, diagnosis, procedures, and orders are all accurate and complete.   IF YOU HAVE BEEN REFERRED TO A SPECIALIST, IT MAY TAKE 1-2 WEEKS TO SCHEDULE/PROCESS THE REFERRAL. IF  YOU HAVE NOT HEARD FROM US/SPECIALIST IN TWO WEEKS, PLEASE GIVE [place] A CALL AT [phone].   THE PATIENT IS ENCOURAGED TO PRACTICE SOCIAL DISTANCING DUE TO THE COVID-19 PANDEMIC.

## 2020-08-14 ENCOUNTER — Ambulatory Visit
Admission: RE | Admit: 2020-08-14 | Discharge: 2020-08-14 | Disposition: A | Discharge status: Home / Self Care | Payer: 59 | Source: Ambulatory Visit | Attending: Nurse Practitioner | Admitting: Nurse Practitioner

## 2020-08-14 DIAGNOSIS — G8929 Other chronic pain: Secondary | ICD-10-CM

## 2020-08-14 DIAGNOSIS — M25512 Pain in left shoulder: Secondary | ICD-10-CM

## 2020-08-18 ENCOUNTER — Encounter: Payer: Self-pay | Admitting: Nurse Practitioner

## 2020-08-18 ENCOUNTER — Telehealth: Payer: Self-pay

## 2020-08-18 NOTE — Telephone Encounter (Signed)
Patient notified of xray results. She was notified if her shoulder pain is not better after the steroid we can place a referral for PT. YL,RMA

## 2020-09-14 ENCOUNTER — Encounter: Payer: Self-pay | Admitting: Nurse Practitioner

## 2020-10-20 ENCOUNTER — Telehealth (INDEPENDENT_AMBULATORY_CARE_PROVIDER_SITE_OTHER): Payer: 59 | Admitting: Nurse Practitioner

## 2020-10-20 VITALS — Temp 97.8°F

## 2020-10-20 DIAGNOSIS — U071 COVID-19: Secondary | ICD-10-CM

## 2020-10-20 DIAGNOSIS — R059 Cough, unspecified: Secondary | ICD-10-CM

## 2020-10-20 MED ORDER — NIRMATRELVIR/RITONAVIR (PAXLOVID)TABLET: 3.0000 | ORAL_TABLET | Freq: Two times a day (BID) | ORAL | 0 refills | Status: DC

## 2020-10-20 MED ORDER — HYDROCODONE BIT-HOMATROP MBR 5-1.5 MG/5ML PO SOLN: 5.0000 mL | Freq: Four times a day (QID) | ORAL | 0 refills | Status: DC | PRN

## 2020-10-20 MED ORDER — NIRMATRELVIR/RITONAVIR (PAXLOVID)TABLET: 3.0000 | ORAL_TABLET | Freq: Two times a day (BID) | ORAL | 0 refills | Status: AC

## 2020-10-20 NOTE — Patient Instructions (Signed)
10 Things You Can Do to Manage Your COVID-19 Symptoms at Home If you have possible or confirmed COVID-19 Stay home except to get medical care. Monitor your symptoms carefully. If your symptoms get worse, call your healthcare provider immediately. Get rest and stay hydrated. If you have a medical appointment, call the healthcare provider ahead of time and tell them that you have or may have COVID-19. For medical emergencies, call 911 and notify the dispatch personnel that you have or may have COVID-19. Cover your cough and sneezes with a tissue or use the inside of your elbow. Wash your hands often with soap and water for at least 20 seconds or clean your hands with an alcohol-based hand sanitizer that contains at least 60% alcohol. As much as possible, stay in a specific room and away from other people in your home. Also, you should use a separate bathroom, if available. If you need to be around other people in or outside of the home, wear a mask. Avoid sharing personal items with other people in your household, like dishes, towels, and bedding. Clean all surfaces that are touched often, like counters, tabletops, and doorknobs. Use household cleaning sprays or wipes according to the label instructions. cdc.gov/coronavirus 09/07/2019 This information is not intended to replace advice given to you by your health care provider. Make sure you discuss any questions you have with your health care provider. Document Revised: 06/26/2020 Document Reviewed: 06/26/2020 Elsevier Patient Education  2022 Elsevier Inc.  

## 2020-10-20 NOTE — Progress Notes (Signed)
Virtual Visit via Video with MyChart    This visit type was conducted due to national recommendations for restrictions regarding the COVID-19 Pandemic (e.g. social distancing) in an effort to limit this patient's exposure and mitigate transmission in our community.  Due to her co-morbid illnesses, this patient is at least at moderate risk for complications without adequate follow up.  This format is felt to be most appropriate for this patient at this time.  All issues noted in this document were discussed and addressed.  A limited physical exam was performed with this format.    This visit type was conducted due to national recommendations for restrictions regarding the COVID-19 Pandemic (e.g. social distancing) in an effort to limit this patient's exposure and mitigate transmission in our community.  Patients identity confirmed using two different identifiers.  This format is felt to be most appropriate for this patient at this time.  All issues noted in this document were discussed and addressed.  No physical exam was performed (except for noted visual exam findings with Video Visits).     Date:  11/20/2020   ID:  Patricia French, DOB [birth year], MRN [id number]  Patient Location:  Home - spoke with Patricia French  Provider location:   Office    Chief Complaint:  positive for covid  History of Present Illness:    Patricia French is a 45 y.o. female who presents via video conferencing for a telehealth visit today.    The patient does have symptoms concerning for COVID-19 infection (fever, chills, cough, or new shortness of breath).   Patient is positive for covid. She tested positive yesterday. Her symptoms began on 10/18/2020. Thought was related to mold and being in a hotel.  When she got home she took a test due to her mother being there. Her sister is there taking care of her mother. Yesterday temp was 99.2, tylenol. She is able to taste and smell. She is able to eat.  Has nasal  congestion. This morning she had muscle aches and fatigue. She only had a headache on Saturday and Sunday.      Past Medical History:  Diagnosis Date   HSV-2 (herpes simplex virus 2) infection    Hyperlipidemia    Past Surgical History:  Procedure Laterality Date   CESAREAN SECTION     CHOLECYSTECTOMY     keloid removal     PARTIAL HYSTERECTOMY     TONSILLECTOMY     tummy tuck       Current Meds  Medication Sig   HYDROcodone bit-homatropine (HYDROMET) 5-1.5 MG/5ML syrup Take 5 mLs by mouth every 6 (six) hours as needed for cough.   [DISCONTINUED] nirmatrelvir/ritonavir EUA (PAXLOVID) 20 x 150 MG & 10 x '100MG'$  TABS Take 3 tablets by mouth 2 (two) times daily for 5 days. Patient GFR is 111. Take nirmatrelvir (150 mg) two tablets twice daily for 5 days and ritonavir (100 mg) one tablet twice daily for 5 days.     Allergies:   Penicillins and Pork-derived products   Social History   Tobacco Use   Smoking status: Never   Smokeless tobacco: Never  Substance Use Topics   Alcohol use: Yes    Comment: occas.   Drug use: No     Family Hx: The patient's family history includes Alzheimer's disease in her father and paternal grandmother; Anxiety disorder in her mother; Bipolar disorder in her father; Cancer in her maternal grandmother; Diabetes in her mother; Heart attack in her father; Heart  disease in her mother; Hyperlipidemia in her father and mother; Hypertension in her maternal grandmother and mother; Leukemia in her paternal grandfather; Migraines in her maternal grandmother; Schizophrenia in her father; Varicose Veins in her mother.  ROS:   Please see the history of present illness.    Review of Systems  HENT:  Positive for congestion and sore throat.   Respiratory:  Positive for cough.    All other systems reviewed and are negative.   Labs/Other Tests and Data Reviewed:    Recent Labs: 10/22/2020: BUN 8; Creatinine, Ser 0.60; Hemoglobin 13.8; Platelets 266; Potassium 3.6;  Sodium 137   Recent Lipid Panel No results found for: CHOL, TRIG, HDL, CHOLHDL, LDLCALC, LDLDIRECT  Wt Readings from Last 3 Encounters:  11/13/20 180 lb 12.8 oz (82 kg)  10/22/20 180 lb (81.6 kg)  08/12/20 182 lb 9.6 oz (82.8 kg)     Exam:    Vital Signs:  Temp 97.8 F (36.6 C) (Oral)     Physical Exam Vitals reviewed.  Constitutional:      Appearance: Normal appearance.  Cardiovascular:     Rate and Rhythm: Normal rate and regular rhythm.     Pulses: Normal pulses.     Heart sounds: Normal heart sounds. No murmur heard. Pulmonary:     Effort: Pulmonary effort is normal. No respiratory distress.     Breath sounds: Normal breath sounds. No wheezing.  Skin:    Capillary Refill: Capillary refill takes less than 2 seconds.  Neurological:     General: No focal deficit present.     Mental Status: She is alert and oriented to person, place, and time.     Cranial Nerves: No cranial nerve deficit.     Motor: No weakness.  Psychiatric:        Mood and Affect: Mood normal.        Behavior: Behavior normal.        Thought Content: Thought content normal.        Judgment: Judgment normal.    ASSESSMENT & PLAN:    1. COVID-19 virus infection Advised patient to take Vitamin C, D, Zinc.  Keep yourself hydrated with a lot of water and rest. Take Delsym for cough and Mucinex as need. Take Tylenol or pain reliever every 4-6 hours as needed for pain/fever/body ache. If you have elevated blood pressure, you can take OTC Corcidin. You can also take OTC oscillococcinum to help with your symptoms.  Educated patient if symptoms get worse or if she experiences any SOB, chest pain or pain in her legs to seek immediate emergency care. Continue to monitor your oxygen levels. Call us if you have any questions. Quarantine for 5 days if tested positive and no symptoms or 10 days if tested positive and have symptoms. Wear a mask around other people.  - nirmatrelvir/ritonavir EUA (PAXLOVID) 20 x 150 MG &  10 x '100MG'$  TABS; Take 3 tablets by mouth 2 (two) times daily for 5 days. Patient GFR is 111. Take nirmatrelvir (150 mg) two tablets twice daily for 5 days and ritonavir (100 mg) one tablet twice daily for 5 days.  Dispense: 30 tablet; Refill: 0  2. Cough  - HYDROcodone bit-homatropine (HYDROMET) 5-1.5 MG/5ML syrup; Take 5 mLs by mouth every 6 (six) hours as needed for cough.  Dispense: 120 mL; Refill: 0     COVID-19 Education: The signs and symptoms of COVID-19 were discussed with the patient and how to seek care for testing (follow up with  PCP or arrange E-visit).  The importance of social distancing was discussed today.  Patient Risk:   After full review of this patients clinical status, I feel that they are at least moderate risk at this time.  Time:   Today, I have spent 13 minutes/ seconds with the patient with telehealth technology discussing above diagnoses.     Medication Adjustments/Labs and Tests Ordered: Current medicines are reviewed at length with the patient today.  Concerns regarding medicines are outlined above.   Tests Ordered: No orders of the defined types were placed in this encounter.   Medication Changes: Meds ordered this encounter  Medications   DISCONTD: nirmatrelvir/ritonavir EUA (PAXLOVID) 20 x 150 MG & 10 x '100MG'$  TABS    Sig: Take 3 tablets by mouth 2 (two) times daily for 5 days. Patient GFR is 111. Take nirmatrelvir (150 mg) two tablets twice daily for 5 days and ritonavir (100 mg) one tablet twice daily for 5 days.    Dispense:  30 tablet    Refill:  0   HYDROcodone bit-homatropine (HYDROMET) 5-1.5 MG/5ML syrup    Sig: Take 5 mLs by mouth every 6 (six) hours as needed for cough.    Dispense:  120 mL    Refill:  0   nirmatrelvir/ritonavir EUA (PAXLOVID) 20 x 150 MG & 10 x '100MG'$  TABS    Sig: Take 3 tablets by mouth 2 (two) times daily for 5 days. Patient GFR is 111. Take nirmatrelvir (150 mg) two tablets twice daily for 5 days and ritonavir (100 mg)  one tablet twice daily for 5 days.    Dispense:  30 tablet    Refill:  0    Disposition:  Follow up prn  Signed, [name], FNP

## 2020-10-22 ENCOUNTER — Emergency Department (HOSPITAL_COMMUNITY): Payer: 59

## 2020-10-22 ENCOUNTER — Encounter (HOSPITAL_COMMUNITY): Payer: Self-pay

## 2020-10-22 ENCOUNTER — Emergency Department (HOSPITAL_COMMUNITY)
Admission: EM | Admit: 2020-10-22 | Discharge: 2020-10-22 | Disposition: A | Discharge status: Home / Self Care | Payer: 59 | Attending: Emergency Medicine | Admitting: Emergency Medicine

## 2020-10-22 ENCOUNTER — Other Ambulatory Visit: Payer: Self-pay

## 2020-10-22 DIAGNOSIS — R059 Cough, unspecified: Secondary | ICD-10-CM

## 2020-10-22 DIAGNOSIS — R509 Fever, unspecified: Secondary | ICD-10-CM | POA: Diagnosis present

## 2020-10-22 DIAGNOSIS — U071 COVID-19: Secondary | ICD-10-CM | POA: Diagnosis not present

## 2020-10-22 DIAGNOSIS — R5383 Other fatigue: Secondary | ICD-10-CM

## 2020-10-22 LAB — CBC WITH DIFFERENTIAL/PLATELET
Abs Immature Granulocytes: 0.01 10*3/uL (ref 0.00–0.07)
Basophils Absolute: 0 10*3/uL (ref 0.0–0.1)
Basophils Relative: 0 %
Eosinophils Absolute: 0 10*3/uL (ref 0.0–0.5)
Eosinophils Relative: 0 %
HCT: 40.9 % (ref 36.0–46.0)
Hemoglobin: 13.8 g/dL (ref 12.0–15.0)
Immature Granulocytes: 0 %
Lymphocytes Relative: 28 %
Lymphs Abs: 1.7 10*3/uL (ref 0.7–4.0)
MCH: 30 pg (ref 26.0–34.0)
MCHC: 33.7 g/dL (ref 30.0–36.0)
MCV: 88.9 fL (ref 80.0–100.0)
Monocytes Absolute: 0.7 10*3/uL (ref 0.1–1.0)
Monocytes Relative: 11 %
Neutro Abs: 3.5 10*3/uL (ref 1.7–7.7)
Neutrophils Relative %: 61 %
Platelets: 266 10*3/uL (ref 150–400)
RBC: 4.6 MIL/uL (ref 3.87–5.11)
RDW: 12.1 % (ref 11.5–15.5)
WBC: 5.9 10*3/uL (ref 4.0–10.5)
nRBC: 0 % (ref 0.0–0.2)

## 2020-10-22 LAB — BASIC METABOLIC PANEL
Anion gap: 7 (ref 5–15)
BUN: 8 mg/dL (ref 6–20)
CO2: 25 mmol/L (ref 22–32)
Calcium: 9.3 mg/dL (ref 8.9–10.3)
Chloride: 105 mmol/L (ref 98–111)
Creatinine, Ser: 0.6 mg/dL (ref 0.44–1.00)
GFR, Estimated: >60 mL/min (ref >60)
Glucose, Bld: 84 mg/dL (ref 70–99)
Potassium: 3.6 mmol/L (ref 3.5–5.1)
Sodium: 137 mmol/L (ref 135–145)

## 2020-10-22 MED ORDER — IBUPROFEN 400 MG PO TABS
600.0000 mg | ORAL_TABLET | Freq: Once | ORAL | Status: AC
  Administered 2020-10-22: 600 mg via ORAL
  Filled 2020-10-22: qty 1

## 2020-10-22 MED ORDER — ACETAMINOPHEN 500 MG PO TABS
1000.0000 mg | ORAL_TABLET | Freq: Once | ORAL | Status: AC
  Administered 2020-10-22: 1000 mg via ORAL
  Filled 2020-10-22: qty 2

## 2020-10-22 NOTE — ED Triage Notes (Signed)
Pt tested positive for COVID 4 days ago, reports fever as high as 104 at home, taking ibuprofen. Pt also reports worsening sob/cough at night along with generalized fatigue. Resp e.u, nad noted.

## 2020-10-22 NOTE — Discharge Instructions (Addendum)
Continue taking Paxil bid and medications prescribed by your primary care doctor to treat your COVID infection.  To help better treat fever and body aches take ibuprofen 600 mg and Tylenol 1000 mg every 6 hours.  Please make sure you are drinking plenty of fluids to stay hydrated.  Return for worsening symptoms as needed.

## 2020-10-22 NOTE — ED Provider Notes (Signed)
Emergency Medicine Provider Triage Evaluation Note  Patricia French , a 45 y.o. female  was evaluated in triage. Patient has known covid, symptoms started on Saturday. Pt complains of fevers to 104 at night, worsening shortness of breath. She is taking ibuprofen and supplements but no tylenol. Denies chest pain, nausea, vomiting.  Review of Systems  Positive: As above Negative: As above  Physical Exam  BP 128/85   Pulse 79   Temp 98.6 F (37 C) (Oral)   Resp 18   Ht 5' 3.5" (1.613 m)   Wt 81.6 kg   SpO2 99%   BMI 31.39 kg/m  Gen:   Awake, no distress   Resp:  Normal effort, some crackles that clear with cough, coughing throughout interview MSK:   Moves extremities without difficulty  Other:    Medical Decision Making  Medically screening exam initiated at 2:33 PM.  Appropriate orders placed.  Carmine Eshleman was informed that the remainder of the evaluation will be completed by another provider, this initial triage assessment does not replace that evaluation, and the importance of remaining in the ED until their evaluation is complete.  covid   Dorien Chihuahua 10/22/20 1435    Daleen Bo, MD 10/23/20 [phone]

## 2020-11-04 NOTE — ED Provider Notes (Signed)
[facility] EMERGENCY DEPARTMENT Provider Note   CSN: [id number] Arrival date & time: 10/22/20  1352     History Chief Complaint  Patient presents with   Covid Positive   Shortness of Breath   Cough   Fatigue    Patricia French is a 45 y.o. female.  Patricia French is a 45 y.o. female with hx of hyperlidipemia and unterine fibroids, who presents for evaluation of fevers, chills, body aches, cough and shortness of breath. Symptoms are worse at night and patient reports difficulty sleeping. Patient tested positive for COVID 4 days ago and symptoms have been constant and worsening. Shortness of breath is only present during spells of coughing. Patient denies chest pain or pleuritic pain. Reports generalized fatigue and malaise. No nausea, vomiting or diarrhea. She has been taking ibuprofen once daily, but has not taken anything else to manage her symptoms. Has had 3 doses of COVID vaccine.  The history is provided by the patient and medical records.      Past Medical History:  Diagnosis Date   HSV-2 (herpes simplex virus 2) infection    Hyperlipidemia     Patient Active Problem List   Diagnosis Date Noted   Fibroid 07/02/2013   Menorrhagia 07/02/2013    Past Surgical History:  Procedure Laterality Date   CESAREAN SECTION     CHOLECYSTECTOMY     keloid removal     PARTIAL HYSTERECTOMY     TONSILLECTOMY     tummy tuck       OB History   No obstetric history on file.     Family History  Problem Relation Age of Onset   Diabetes Mother    Hypertension Mother    Hyperlipidemia Mother    Heart disease Mother    Varicose Veins Mother    Anxiety disorder Mother    Heart attack Father    Bipolar disorder Father    Schizophrenia Father    Hyperlipidemia Father    Alzheimer's disease Father    Cancer Maternal Grandmother    Migraines Maternal Grandmother    Hypertension Maternal Grandmother    Alzheimer's disease Paternal Grandmother    Leukemia  Paternal Grandfather     Social History   Tobacco Use   Smoking status: Never   Smokeless tobacco: Never  Substance Use Topics   Alcohol use: Yes    Comment: occas.   Drug use: No    Home Medications Prior to Admission medications   Medication Sig Start Date End Date Taking? Authorizing Provider  Ascorbic Acid (VITAMIN C) 1000 MG tablet Take by mouth.   Yes [provider]  estradiol (VIVELLE-DOT) 0.075 MG/24HR Place 1 patch onto the skin daily at 6 (six) AM.   Yes [provider]  HYDROcodone bit-homatropine (HYDROMET) 5-1.5 MG/5ML syrup Take 5 mLs by mouth every 6 (six) hours as needed for cough. 10/20/20  Yes [name], FNP  ibuprofen (ADVIL) 200 MG tablet Take 200 mg by mouth every 6 (six) hours as needed for fever or headache.   Yes [provider]  Magnesium 250 MG TABS Take 1 tablet (250 mg total) by mouth daily. Take with evening meal 06/04/20  Yes [name], FNP  rosuvastatin (CRESTOR) 5 MG tablet Take 5 mg by mouth daily at 6 (six) AM.   Yes [provider]  Saccharomyces boulardii (PROBIOTIC) 250 MG CAPS Take 1 capsule by mouth daily.   Yes [provider]  valACYclovir (VALTREX) 500 MG tablet Take 500  mg by mouth daily at 6 (six) AM.   Yes [provider]  Vitamin D, Ergocalciferol, (DRISDOL) 1.25 MG (50000 UNIT) CAPS capsule Take 1 capsule by mouth daily at 6 (six) AM. 04/12/20  Yes [provider]  Zinc 20 MG CAPS Take 1 capsule by mouth daily.   Yes [provider]  buPROPion (WELLBUTRIN SR) 100 MG 12 hr tablet Take 1 tablet (100 mg total) by mouth daily. Patient not taking: Reported on 10/22/2020 06/04/20 06/04/21  [name], FNP  predniSONE (STERAPRED UNI-PAK 21 TAB) 10 MG (21) TBPK tablet Take as directed Patient not taking: Reported on 10/22/2020 08/12/20   [name], FNP    Allergies    Penicillins and Pork-derived products  Review of Systems   Review of Systems  Constitutional:   Positive for chills, fatigue and fever.  HENT:  Positive for congestion, rhinorrhea and sore throat.   Respiratory:  Positive for cough and shortness of breath.   Cardiovascular:  Negative for chest pain.  Gastrointestinal:  Negative for diarrhea and vomiting.  Musculoskeletal:  Positive for myalgias. Negative for arthralgias.  Skin:  Negative for color change and rash.  Neurological:  Positive for headaches. Negative for syncope and light-headedness.  All other systems reviewed and are negative.  Physical Exam Updated Vital Signs BP (!) 132/92   Pulse 68   Temp 98 F (36.7 C) (Oral)   Resp 11   Ht 5' 3.5" (1.613 m)   Wt 81.6 kg   SpO2 98%   BMI 31.39 kg/m   Physical Exam Vitals and nursing note reviewed.  Constitutional:      General: She is not in acute distress.    Appearance: She is well-developed. She is not ill-appearing or diaphoretic.  HENT:     Head: Normocephalic and atraumatic.     Nose: Rhinorrhea present.     Mouth/Throat:     Mouth: Mucous membranes are moist.     Pharynx: Oropharynx is clear.  Eyes:     General:        Right eye: No discharge.        Left eye: No discharge.  Neck:     Comments: No rigidity Cardiovascular:     Rate and Rhythm: Normal rate and regular rhythm.     Heart sounds: Normal heart sounds. No murmur heard.   No friction rub. No gallop.  Pulmonary:     Effort: Pulmonary effort is normal. No respiratory distress.     Breath sounds: Normal breath sounds.     Comments: Respirations equal and unlabored, patient able to speak in full sentences, lungs clear to auscultation bilaterally  Abdominal:     General: Bowel sounds are normal. There is no distension.     Palpations: Abdomen is soft. There is no mass.     Tenderness: There is no abdominal tenderness. There is no guarding.     Comments: Abdomen soft, nondistended, nontender to palpation in all quadrants without guarding or peritoneal signs  Musculoskeletal:        General: No  deformity.     Cervical back: Neck supple.  Lymphadenopathy:     Cervical: No cervical adenopathy.  Skin:    General: Skin is warm and dry.     Capillary Refill: Capillary refill takes less than 2 seconds.  Neurological:     Mental Status: She is alert and oriented to person, place, and time.  Psychiatric:        Mood and Affect: Mood normal.  Behavior: Behavior normal.    ED Results / Procedures / Treatments   Labs (all labs ordered are listed, but only abnormal results are displayed) Labs Reviewed  BASIC METABOLIC PANEL  CBC WITH DIFFERENTIAL/PLATELET    EKG EKG Interpretation  Date/Time:  Wednesday October 22 2020 14:26:05 EDT Ventricular Rate:  79 PR Interval:  136 QRS Duration: 88 QT Interval:  376 QTC Calculation: 431 R Axis:   84 Text Interpretation: Normal sinus rhythm Normal ECG No old tracing to compare Confirmed by Calvert Cantor [phone]) on 10/22/2020 5:06:10 PM  Radiology No results found.  Procedures Procedures   Medications Ordered in ED Medications  ibuprofen (ADVIL) tablet 600 mg (600 mg Oral Given 10/22/20 1702)  acetaminophen (TYLENOL) tablet 1,000 mg (1,000 mg Oral Given 10/22/20 1701)    ED Course  I have reviewed the triage vital signs and the nursing notes.  Pertinent labs & imaging results that were available during my care of the patient were reviewed by me and considered in my medical decision making (see chart for details).    MDM Rules/Calculators/A&P                          45 y.o. female presents with 4 days of fatigue, fevers, chills, cough and shortness of breath. Patient had positive home COVID test 4 days ago. Patient has received COVID vaccines and booster. Fortunately patient is overall well-appearing, Vitals WNL. Patient with no hypoxia or increased work of breathing at rest or with activity.   Chest x-ray reviewed and is clear without signs of COVID-pneumonia at this time, no other active cardiopulmonary disease  noted.  Lab work ordered from triage which is overall reassuring without significant electrolyte derangements, leukocytosis or anemia.  Patient with COVID infection but overall symptoms appear mild and evaluation has been very reassuring today. No criteria for admission at this time. Discussed appropriate quarantine at home as well as continued symptomatic treatment. Encourage patient to purchase pulse ox for monitoring of O2 sats at home and discussed strict return precaution. Provided information for post-COVID care clinic as well. Strict return precautions discussed. Patient expresses understanding and agreement. Discharged home in good condition.  Patricia French was evaluated in Emergency Department on 11/04/2020 for the symptoms described in the history of present illness. She was evaluated in the context of the global COVID-19 pandemic, which necessitated consideration that the patient might be at risk for infection with the SARS-CoV-2 virus that causes COVID-19. Institutional protocols and algorithms that pertain to the evaluation of patients at risk for COVID-19 are in a state of rapid change based on information released by regulatory bodies including the CDC and federal and state organizations. These policies and algorithms were followed during the patient's care in the ED.  Final Clinical Impression(s) / ED Diagnoses Final diagnoses:  [id number] virus infection  Fatigue, unspecified type  Cough    Rx / DC Orders ED Discharge Orders     None        Janet Berlin 11/04/20 1235    Daleen Bo, MD 11/28/20 [phone]

## 2020-11-12 ENCOUNTER — Encounter: Payer: Self-pay | Admitting: Gastroenterology

## 2020-11-13 ENCOUNTER — Other Ambulatory Visit: Payer: Self-pay

## 2020-11-13 ENCOUNTER — Ambulatory Visit: Payer: 59 | Admitting: Nurse Practitioner

## 2020-11-13 ENCOUNTER — Encounter: Payer: Self-pay | Admitting: Nurse Practitioner

## 2020-11-13 VITALS — BP 130/72 | HR 73 | Temp 98.7°F | Ht 63.2 in | Wt 180.8 lb

## 2020-11-13 DIAGNOSIS — Z8616 Personal history of COVID-19: Secondary | ICD-10-CM | POA: Diagnosis not present

## 2020-11-13 DIAGNOSIS — Z6831 Body mass index (BMI) 31.0-31.9, adult: Secondary | ICD-10-CM

## 2020-11-13 DIAGNOSIS — E6609 Other obesity due to excess calories: Secondary | ICD-10-CM

## 2020-11-13 DIAGNOSIS — R5383 Other fatigue: Secondary | ICD-10-CM

## 2020-11-13 DIAGNOSIS — F419 Anxiety disorder, unspecified: Secondary | ICD-10-CM

## 2020-11-13 MED ORDER — PHENTERMINE HCL 15 MG PO CAPS: 15.0000 mg | ORAL_CAPSULE | ORAL | 1 refills | Status: DC

## 2020-11-13 NOTE — Progress Notes (Signed)
I,Tianna Badgett,acting as a Education administrator for [employer], FNP.,have documented all relevant documentation on the behalf of [name], FNP,as directed by  [name], FNP while in the presence of [name], [place].  This visit occurred during the SARS-CoV-2 public health emergency.  Safety protocols were in place, including screening questions prior to the visit, additional usage of staff PPE, and extensive cleaning of exam room while observing appropriate contact time as indicated for disinfecting solutions.  Subjective:     Patient ID: Patricia French , female    DOB: [birth year] , 44 y.o.   MRN: [id number]   Chief Complaint  Patient presents with   Anxiety    HPI  Patient presents today for a f/u on anxiety.  She would like to discuss her congestion that she has been having since having covid. She has a dull fog. Every evening she has sensations and pain to her left side of her body with sciatic nerve, ear, shoulder. She had yellow and bloody discharge. She is unable to sleep at night so she raises her head of her bed. Continues to use flonase, saline mist. Few times a year she will get a sinus infection pre-pandemic. Trying to get up in the morning it takes a lot for her to get up. She is taking vitamin b, vitamin c, vitamin d. She is also taking mucinex DM.    She is having challenges with caring for her mother, she will not listen to what she is saying.   Wt Readings from Last 3 Encounters: 11/13/20 : 180 lb 12.8 oz (82 kg) 10/22/20 : 180 lb (81.6 kg) 08/12/20 : 182 lb 9.6 oz (82.8 kg)    Anxiety Presents for initial visit. Onset was 6 to 12 months ago. Patient reports no chest pain, dizziness or palpitations.      Past Medical History:  Diagnosis Date   HSV-2 (herpes simplex virus 2) infection    Hyperlipidemia      Family History  Problem Relation Age of Onset   Diabetes Mother    Hypertension Mother    Hyperlipidemia Mother    Heart disease Mother    Varicose Veins  Mother    Anxiety disorder Mother    Heart attack Father    Bipolar disorder Father    Schizophrenia Father    Hyperlipidemia Father    Alzheimer's disease Father    Cancer Maternal Grandmother    Migraines Maternal Grandmother    Hypertension Maternal Grandmother    Alzheimer's disease Paternal Grandmother    Leukemia Paternal Grandfather      Current Outpatient Medications:    phentermine 15 MG capsule, Take 1 capsule (15 mg total) by mouth every morning., Disp: 30 capsule, Rfl: 1   ALPRAZolam (XANAX) 0.5 MG tablet, alprazolam 0.5 mg tablet, Disp: , Rfl:    Ascorbic Acid (VITAMIN C) 1000 MG tablet, Take by mouth., Disp: , Rfl:    azithromycin (ZITHROMAX Z-PAK) 250 MG tablet, Take 2 tabs by mouth today, then 1 tab by mouth for 4 days, Disp: 6 each, Rfl: 0   benzonatate (TESSALON PERLES) 100 MG capsule, Take 1 capsule (100 mg total) by mouth every 6 (six) hours as needed., Disp: 30 capsule, Rfl: 1   estradiol (VIVELLE-DOT) 0.075 MG/24HR, Place 1 patch onto the skin daily at 6 (six) AM., Disp: , Rfl:    HYDROcodone bit-homatropine (HYDROMET) 5-1.5 MG/5ML syrup, Take 5 mLs by mouth every 6 (six) hours as needed for cough., Disp: 120 mL, Rfl: 0  ibuprofen (ADVIL) 200 MG tablet, Take 200 mg by mouth every 6 (six) hours as needed for fever or headache., Disp: , Rfl:    Magnesium 250 MG TABS, Take 1 tablet (250 mg total) by mouth daily. Take with evening meal, Disp: 90 tablet, Rfl: 1   predniSONE (STERAPRED UNI-PAK 21 TAB) 10 MG (21) TBPK tablet, Take as directed, Disp: 21 tablet, Rfl: 0   rosuvastatin (CRESTOR) 5 MG tablet, Take 5 mg by mouth daily at 6 (six) AM., Disp: , Rfl:    Saccharomyces boulardii (PROBIOTIC) 250 MG CAPS, Take 1 capsule by mouth daily., Disp: , Rfl:    valACYclovir (VALTREX) 500 MG tablet, Take 500 mg by mouth daily at 6 (six) AM., Disp: , Rfl:    Vitamin D, Ergocalciferol, (DRISDOL) 1.25 MG (50000 UNIT) CAPS capsule, Take 1 capsule by mouth daily at 6 (six) AM., Disp:  , Rfl:    Zinc 20 MG CAPS, Take 1 capsule by mouth daily., Disp: , Rfl:    Allergies  Allergen Reactions   Penicillins Hives   Pork-Derived Products      Review of Systems  Constitutional: Negative.   Respiratory: Negative.    Cardiovascular:  Negative for chest pain, palpitations and leg swelling.  Musculoskeletal:  Negative for back pain.       Left shoulder pain  Neurological:  Negative for dizziness and headaches.  Psychiatric/Behavioral: Negative.      Today's Vitals   11/13/20 1533  BP: 130/72  Pulse: 73  Temp: 98.7 F (37.1 C)  TempSrc: Oral  Weight: 180 lb 12.8 oz (82 kg)  Height: 5' 3.2" (1.605 m)   Body mass index is 31.82 kg/m.   Objective:  Physical Exam Vitals reviewed.  Constitutional:      General: She is not in acute distress.    Appearance: Normal appearance. She is obese.  Cardiovascular:     Rate and Rhythm: Normal rate and regular rhythm.     Pulses: Normal pulses.     Heart sounds: Normal heart sounds. No murmur heard. Pulmonary:     Effort: Pulmonary effort is normal. No respiratory distress.     Breath sounds: Normal breath sounds. No wheezing.  Musculoskeletal:        General: No swelling or tenderness. Normal range of motion.     Right lower leg: No edema.     Left lower leg: No edema.     Comments: No pain on palpation to back  Skin:    General: Skin is warm and dry.     Capillary Refill: Capillary refill takes less than 2 seconds.  Neurological:     General: No focal deficit present.     Mental Status: She is alert and oriented to person, place, and time.     Cranial Nerves: No cranial nerve deficit.     Motor: No weakness.  Psychiatric:        Mood and Affect: Mood normal.        Behavior: Behavior normal.        Thought Content: Thought content normal.        Judgment: Judgment normal.        Assessment And Plan:     1. Anxiety Comments: Continues to have anxiety mostly related to caring for her mother  2. Fatigue,  unspecified type Comments: This is likely related to having covid, encouraged to take vitamin B12  3. Class 1 obesity due to excess calories without serious comorbidity with body mass index (  BMI) of 31.0 to 31.9 in adult Chronic Discussed healthy diet and regular exercise options  Encouraged to exercise at least 150 minutes per week with 2 days of strength training Will start her on phentermine, discussed side effects of phentermine and to monitor for increased heart rate or blood pressure   4. History of COVID-19     Patient was given opportunity to ask questions. Patient verbalized understanding of the plan and was able to repeat key elements of the plan. All questions were answered to their satisfaction.  [name], FNP   I, [name], FNP, have reviewed all documentation for this visit. The documentation on 11/13/20 for the exam, diagnosis, procedures, and orders are all accurate and complete.   IF YOU HAVE BEEN REFERRED TO A SPECIALIST, IT MAY TAKE 1-2 WEEKS TO SCHEDULE/PROCESS THE REFERRAL. IF YOU HAVE NOT HEARD FROM US/SPECIALIST IN TWO WEEKS, PLEASE GIVE [place] A CALL AT [phone].   THE PATIENT IS ENCOURAGED TO PRACTICE SOCIAL DISTANCING DUE TO THE COVID-19 PANDEMIC.

## 2020-11-13 NOTE — Patient Instructions (Signed)
Generalized Anxiety Disorder, Adult Generalized anxiety disorder (GAD) is a mental health condition. Unlike normal worries, anxiety related to GAD is not triggered by a specific event. These worries do not fade or get better with time. GAD interferes with relationships, work, and school. GAD symptoms can vary from mild to severe. People with severe GAD can have intense waves of anxiety with physical symptoms that are similar to panic attacks. What are the causes? The exact cause of GAD is not known, but the following are believed to have an impact: Differences in natural brain chemicals. Genes passed down from parents to children. Differences in the way threats are perceived. Development during childhood. Personality. What increases the risk? The following factors may make you more likely to develop this condition: Being female. Having a family history of anxiety disorders. Being very shy. Experiencing very stressful life events, such as the death of a loved one. Having a very stressful family environment. What are the signs or symptoms? People with GAD often worry excessively about many things in their lives, such as their health and family. Symptoms may also include: Mental and emotional symptoms: Worrying excessively about natural disasters. Fear of being late. Difficulty concentrating. Fears that others are judging your performance. Physical symptoms: Fatigue. Headaches, muscle tension, muscle twitches, trembling, or feeling shaky. Feeling like your heart is pounding or beating very fast. Feeling out of breath or like you cannot take a deep breath. Having trouble falling asleep or staying asleep, or experiencing restlessness. Sweating. Nausea, diarrhea, or irritable bowel syndrome (IBS). Behavioral symptoms: Experiencing erratic moods or irritability. Avoidance of new situations. Avoidance of people. Extreme difficulty making decisions. How is this diagnosed? This condition  is diagnosed based on your symptoms and medical history. You will also have a physical exam. Your health care provider may perform tests to rule out other possible causes of your symptoms. To be diagnosed with GAD, a person must have anxiety that: Is out of his or her control. Affects several different aspects of his or her life, such as work and relationships. Causes distress that makes him or her unable to take part in normal activities. Includes at least three symptoms of GAD, such as restlessness, fatigue, trouble concentrating, irritability, muscle tension, or sleep problems. Before your health care provider can confirm a diagnosis of GAD, these symptoms must be present more days than they are not, and they must last for 6 months or longer. How is this treated? This condition may be treated with: Medicine. Antidepressant medicine is usually prescribed for long-term daily control. Anti-anxiety medicines may be added in severe cases, especially when panic attacks occur. Talk therapy (psychotherapy). Certain types of talk therapy can be helpful in treating GAD by providing support, education, and guidance. Options include: Cognitive behavioral therapy (CBT). People learn coping skills and self-calming techniques to ease their physical symptoms. They learn to identify unrealistic thoughts and behaviors and to replace them with more appropriate thoughts and behaviors. Acceptance and commitment therapy (ACT). This treatment teaches people how to be mindful as a way to cope with unwanted thoughts and feelings. Biofeedback. This process trains you to manage your body's response (physiological response) through breathing techniques and relaxation methods. You will work with a therapist while machines are used to monitor your physical symptoms. Stress management techniques. These include yoga, meditation, and exercise. A mental health specialist can help determine which treatment is best for you. Some  people see improvement with one type of therapy. However, other people require a combination   of therapies. Follow these instructions at home: Lifestyle Maintain a consistent routine and schedule. Anticipate stressful situations. Create a plan, and allow extra time to work with your plan. Practice stress management or self-calming techniques that you have learned from your therapist or your health care provider. General instructions Take over-the-counter and prescription medicines only as told by your health care provider. Understand that you are likely to have setbacks. Accept this and be kind to yourself as you persist to take better care of yourself. Recognize and accept your accomplishments, even if you judge them as small. Keep all follow-up visits as told by your health care provider. This is important. Contact a health care provider if: Your symptoms do not get better. Your symptoms get worse. You have signs of depression, such as: A persistently sad or irritable mood. Loss of enjoyment in activities that used to bring you joy. Change in weight or eating. Changes in sleeping habits. Avoiding friends or family members. Loss of energy for normal tasks. Feelings of guilt or worthlessness. Get help right away if: You have serious thoughts about hurting yourself or others. If you ever feel like you may hurt yourself or others, or have thoughts about taking your own life, get help right away. Go to your nearest emergency department or: Call your local emergency services (911 in the [place] Call a suicide crisis helpline, such as the National Suicide Prevention Lifeline at 1-800-273-8255. This is open 24 hours a day in the [place] Text the Crisis Text Line at 741741 (in the [place] Summary Generalized anxiety disorder (GAD) is a mental health condition that involves worry that is not triggered by a specific event. People with GAD often worry excessively about many things in their lives, such  as their health and family. GAD may cause symptoms such as restlessness, trouble concentrating, sleep problems, frequent sweating, nausea, diarrhea, headaches, and trembling or muscle twitching. A mental health specialist can help determine which treatment is best for you. Some people see improvement with one type of therapy. However, other people require a combination of therapies. This information is not intended to replace advice given to you by your health care provider. Make sure you discuss any questions you have with your health care provider. Document Revised: 11/29/2018 Document Reviewed: 11/29/2018 Elsevier Patient Education  2022 [employer] Inc.  

## 2020-11-14 ENCOUNTER — Other Ambulatory Visit: Payer: Self-pay

## 2020-11-14 ENCOUNTER — Encounter: Payer: Self-pay | Admitting: Nurse Practitioner

## 2020-11-14 MED ORDER — AZITHROMYCIN 250 MG PO TABS: ORAL_TABLET | ORAL | 0 refills | Status: DC

## 2020-11-20 ENCOUNTER — Encounter: Payer: Self-pay | Admitting: Nurse Practitioner

## 2020-11-20 ENCOUNTER — Other Ambulatory Visit: Payer: Self-pay | Admitting: Nurse Practitioner

## 2020-11-20 MED ORDER — PREDNISONE 10 MG (21) PO TBPK: ORAL_TABLET | ORAL | 0 refills | Status: DC

## 2020-11-20 MED ORDER — BENZONATATE 100 MG PO CAPS: 100.0000 mg | ORAL_CAPSULE | Freq: Four times a day (QID) | ORAL | 1 refills | Status: DC | PRN

## 2020-11-24 ENCOUNTER — Encounter: Payer: Self-pay | Admitting: Nurse Practitioner

## 2020-11-24 NOTE — Telephone Encounter (Signed)
Called patient to confirm how many forms for FMLA she needed completed for her mother only due to her multiple doctor visits. I also reviewed her genetic screening for behavioral health medications.

## 2020-12-09 ENCOUNTER — Other Ambulatory Visit: Payer: Self-pay

## 2020-12-09 ENCOUNTER — Encounter: Payer: Self-pay | Admitting: Gastroenterology

## 2020-12-09 ENCOUNTER — Ambulatory Visit (AMBULATORY_SURGERY_CENTER): Payer: 59

## 2020-12-09 VITALS — Ht 63.0 in | Wt 170.0 lb

## 2020-12-09 DIAGNOSIS — Z1211 Encounter for screening for malignant neoplasm of colon: Secondary | ICD-10-CM

## 2020-12-09 MED ORDER — PEG-KCL-NACL-NASULF-NA ASC-C 100 G PO SOLR: 1.0000 | Freq: Once | ORAL | 0 refills | Status: AC

## 2020-12-09 NOTE — Progress Notes (Signed)
   Patient's pre-visit was done today over the phone with the patient   Name,DOB and address verified.   Patient denies any allergies to Eggs and Soy.  Patient denies any problems with anesthesia/sedation. Patient denies taking diet pills or blood thinners.  Denies atrial flutter or atrial fib Denies chronic constipation No home Oxygen.   Packet of Prep instructions mailed to patient including a copy of a consent form-pt is aware.  Patient understands to call us back with any questions or concerns.  Patient is aware of our care-partner policy and IONGE-[id number] safety protocol.   EMMI education assigned to the patient for the procedure, sent to [place].

## 2021-01-01 ENCOUNTER — Encounter: Payer: Self-pay | Admitting: Gastroenterology

## 2021-01-01 ENCOUNTER — Other Ambulatory Visit: Payer: Self-pay

## 2021-01-01 ENCOUNTER — Ambulatory Visit (AMBULATORY_SURGERY_CENTER): Payer: 59 | Admitting: Gastroenterology

## 2021-01-01 VITALS — BP 118/82 | HR 85 | Temp 96.8°F | Resp 14 | Ht 63.0 in | Wt 170.0 lb

## 2021-01-01 DIAGNOSIS — Z1211 Encounter for screening for malignant neoplasm of colon: Secondary | ICD-10-CM

## 2021-01-01 MED ORDER — SODIUM CHLORIDE 0.9 % IV SOLN: 500.0000 mL | Freq: Once | INTRAVENOUS | Status: DC

## 2021-01-01 NOTE — Op Note (Signed)
[facility] Patient Name: Patricia French Procedure Date: 01/01/2021 9:26 AM MRN: [id number] Endoscopist: Mauri Pole , MD Age: 45 Referring MD:  Date of Birth: [birth year] Gender: Female Account #: [id number] Procedure:                Colonoscopy Indications:              Screening for colorectal malignant neoplasm Medicines:                Monitored Anesthesia Care Procedure:                Pre-Anesthesia Assessment:                           - Prior to the procedure, a History and Physical                            was performed, and patient medications and                            allergies were reviewed. The patient's tolerance of                            previous anesthesia was also reviewed. The risks                            and benefits of the procedure and the sedation                            options and risks were discussed with the patient.                            All questions were answered, and informed consent                            was obtained. Prior Anticoagulants: The patient has                            taken no previous anticoagulant or antiplatelet                            agents. ASA Grade Assessment: II - A patient with                            mild systemic disease. After reviewing the risks                            and benefits, the patient was deemed in                            satisfactory condition to undergo the procedure.                           After obtaining informed consent, the colonoscope  was passed under direct vision. Throughout the                            procedure, the patient's blood pressure, pulse, and                            oxygen saturations were monitored continuously. The                            Olympus PCF-H190DL ([id number]) Colonoscope was                            introduced through the anus and advanced to the the                            cecum,  identified by appendiceal orifice and                            ileocecal valve. The colonoscopy was somewhat                            difficult due to restricted mobility of the colon                            and a tortuous colon. Successful completion of the                            procedure was aided by applying abdominal pressure.                            The patient tolerated the procedure well. The                            quality of the bowel preparation was good. The                            ileocecal valve, appendiceal orifice, and rectum                            were photographed. Scope In: 9:40:55 AM Scope Out: 10:15:16 AM Scope Withdrawal Time: 0 hours 8 minutes 42 seconds  Total Procedure Duration: 0 hours 34 minutes 21 seconds  Findings:                 The perianal and digital rectal examinations were                            normal.                           Scattered small and large-mouthed diverticula were                            found in the sigmoid colon, descending colon,  transverse colon and ascending colon. There was                            narrowing of the colon in association with the                            diverticular opening. There was evidence of                            diverticular spasm. Peri-diverticular erythema was                            seen.                           Non-bleeding external and internal hemorrhoids were                            found during retroflexion. The hemorrhoids were                            small. Complications:            No immediate complications. Estimated Blood Loss:     Estimated blood loss was minimal. Impression:               - Severe diverticulosis in the sigmoid colon, in                            the descending colon, in the transverse colon and                            in the ascending colon. There was narrowing of the                             colon in association with the diverticular opening.                            There was evidence of diverticular spasm.                            Peri-diverticular erythema was seen.                           - Non-bleeding external and internal hemorrhoids.                           - No specimens collected. Recommendation:           - Patient has a contact number available for                            emergencies. The signs and symptoms of potential                            delayed complications were discussed with the  patient. Return to normal activities tomorrow.                            Written discharge instructions were provided to the                            patient.                           - Resume previous diet.                           - Continue present medications.                           - Repeat colonoscopy in 10 years for screening                            purposes. Mauri Pole, MD 01/01/2021 10:20:45 AM This report has been signed electronically.

## 2021-01-01 NOTE — Progress Notes (Signed)
[facility] History and Physical   Primary Care Physician:  [name], FNP   Reason for Procedure:  Colorectal cancer screening  Plan:    Screening colonoscopy with possible interventions as needed     HPI: Patricia French is a very pleasant 45 y.o. female here for screening colonoscopy. Denies any nausea, vomiting, abdominal pain, melena or bright red blood per rectum  The risks and benefits as well as alternatives of endoscopic procedure(s) have been discussed and reviewed. All questions answered. The patient agrees to proceed.    Past Medical History:  Diagnosis Date   Anxiety    HSV-2 (herpes simplex virus 2) infection    Hyperlipidemia     Past Surgical History:  Procedure Laterality Date   CESAREAN SECTION     CHOLECYSTECTOMY     keloid removal     PARTIAL HYSTERECTOMY     TONSILLECTOMY     tummy tuck      Prior to Admission medications   Medication Sig Start Date End Date Taking? Authorizing Provider  ALPRAZolam Duanne Moron) 0.5 MG tablet alprazolam 0.5 mg tablet   Yes [provider]  Ascorbic Acid (VITAMIN C) 1000 MG tablet Take by mouth.   Yes [provider]  estradiol (VIVELLE-DOT) 0.075 MG/24HR Place 1 patch onto the skin daily at 6 (six) AM.   Yes [provider]  fluticasone (FLONASE) 50 MCG/ACT nasal spray fluticasone propionate 50 mcg/actuation nasal spray,suspension 10/30/19  Yes [provider]  rosuvastatin (CRESTOR) 5 MG tablet Take 5 mg by mouth daily at 6 (six) AM.   Yes [provider]  Saccharomyces boulardii (PROBIOTIC) 250 MG CAPS Take 1 capsule by mouth daily.   Yes [provider]  valACYclovir (VALTREX) 500 MG tablet Take 500 mg by mouth daily at 6 (six) AM.   Yes [provider]  Cetirizine HCl 10 MG CAPS cetirizine 10 mg capsule  Take by oral route.    [provider]  Cyanocobalamin (VITAMIN B-12 PO) Vitamin B-12    [provider]  Magnesium 250  MG TABS Take 1 tablet (250 mg total) by mouth daily. Take with evening meal 06/04/20   [name], FNP  phentermine 15 MG capsule Take 1 capsule (15 mg total) by mouth every morning. 11/13/20   [name], FNP  Vitamin D, Ergocalciferol, (DRISDOL) 1.25 MG (50000 UNIT) CAPS capsule Take 1 capsule by mouth daily at 6 (six) AM. 04/12/20   [provider]  Zinc 20 MG CAPS Take 1 capsule by mouth daily.    [provider]    Current Outpatient Medications  Medication Sig Dispense Refill   ALPRAZolam (XANAX) 0.5 MG tablet alprazolam 0.5 mg tablet     Ascorbic Acid (VITAMIN C) 1000 MG tablet Take by mouth.     estradiol (VIVELLE-DOT) 0.075 MG/24HR Place 1 patch onto the skin daily at 6 (six) AM.     fluticasone (FLONASE) 50 MCG/ACT nasal spray fluticasone propionate 50 mcg/actuation nasal spray,suspension     rosuvastatin (CRESTOR) 5 MG tablet Take 5 mg by mouth daily at 6 (six) AM.     Saccharomyces boulardii (PROBIOTIC) 250 MG CAPS Take 1 capsule by mouth daily.     valACYclovir (VALTREX) 500 MG tablet Take 500 mg by mouth daily at 6 (six) AM.     Cetirizine HCl 10 MG CAPS cetirizine 10 mg capsule  Take by oral route.     Cyanocobalamin (VITAMIN B-12 PO) Vitamin B-12     Magnesium 250 MG  TABS Take 1 tablet (250 mg total) by mouth daily. Take with evening meal 90 tablet 1   phentermine 15 MG capsule Take 1 capsule (15 mg total) by mouth every morning. 30 capsule 1   Vitamin D, Ergocalciferol, (DRISDOL) 1.25 MG (50000 UNIT) CAPS capsule Take 1 capsule by mouth daily at 6 (six) AM.     Zinc 20 MG CAPS Take 1 capsule by mouth daily.     Current Facility-Administered Medications  Medication Dose Route Frequency Provider Last Rate Last Admin   0.9 %  sodium chloride infusion  500 mL Intravenous Once Alexina Niccoli, Venia Minks, MD        Allergies as of 01/01/2021 - Review Complete 01/01/2021  Allergen Reaction Noted   Penicillins Hives 05/31/2013   Pork-derived products  06/04/2020     Family History  Problem Relation Age of Onset   Diabetes Mother    Hypertension Mother    Hyperlipidemia Mother    Heart disease Mother    Varicose Veins Mother    Anxiety disorder Mother    Heart attack Father    Bipolar disorder Father    Schizophrenia Father    Hyperlipidemia Father    Alzheimer's disease Father    Cancer Maternal Grandmother    Migraines Maternal Grandmother    Hypertension Maternal Grandmother    Alzheimer's disease Paternal Grandmother    Leukemia Paternal Grandfather    Colon cancer Neg Hx    Colon polyps Neg Hx    Esophageal cancer Neg Hx    Rectal cancer Neg Hx    Stomach cancer Neg Hx     Social History   Socioeconomic History   Marital status: Divorced    Spouse name: Not on file   Number of children: Not on file   Years of education: Not on file   Highest education level: Not on file  Occupational History   Not on file  Tobacco Use   Smoking status: Never   Smokeless tobacco: Never  Vaping Use   Vaping Use: Never used  Substance and Sexual Activity   Alcohol use: Yes    Comment: occas.   Drug use: No   Sexual activity: Not on file  Other Topics Concern   Not on file  Social History Narrative   Not on file   Social Determinants of Health   Financial Resource Strain: Not on file  Food Insecurity: Not on file  Transportation Needs: Not on file  Physical Activity: Not on file  Stress: Not on file  Social Connections: Not on file  Intimate Partner Violence: Not on file    Review of Systems:  All other review of systems negative except as mentioned in the HPI.  Physical Exam: Vital signs in last 24 hours: BP 135/74   Pulse 69   Temp (!) 96.8 F (36 C) (Temporal)   Ht 5\' 3"  (1.6 m)   Wt 170 lb (77.1 kg)   SpO2 99%   BMI 30.11 kg/m     General:   Alert, NAD Lungs:  Clear .   Heart:  Regular rate and rhythm Abdomen:  Soft, nontender and nondistended. Neuro/Psych:  Alert and cooperative. Normal mood and affect.  A and O x 3  Reviewed labs, radiology imaging, old records and pertinent past GI work up  Patient is appropriate for planned procedure(s) and anesthesia in an ambulatory setting   K. Denzil Magnuson , MD [phone]

## 2021-01-01 NOTE — Progress Notes (Signed)
Pt's states no medical or surgical changes since previsit or office visit. 

## 2021-01-01 NOTE — Patient Instructions (Signed)
YOU HAD AN ENDOSCOPIC PROCEDURE TODAY AT [facility]:   Refer to the procedure report that was given to you for any specific questions about what was found during the examination.  If the procedure report does not answer your questions, please call your gastroenterologist to clarify.  If you requested that your care partner not be given the details of your procedure findings, then the procedure report has been included in a sealed envelope for you to review at your convenience later.  **Handouts given on hemorrhoids and diverticulosis**  YOU SHOULD EXPECT: Some feelings of bloating in the abdomen. Passage of more gas than usual.  Walking can help get rid of the air that was put into your GI tract during the procedure and reduce the bloating. If you had a lower endoscopy (such as a colonoscopy or flexible sigmoidoscopy) you may notice spotting of blood in your stool or on the toilet paper. If you underwent a bowel prep for your procedure, you may not have a normal bowel movement for a few days.  Please Note:  You might notice some irritation and congestion in your nose or some drainage.  This is from the oxygen used during your procedure.  There is no need for concern and it should clear up in a day or so.  SYMPTOMS TO REPORT IMMEDIATELY:  Following lower endoscopy (colonoscopy or flexible sigmoidoscopy):  Excessive amounts of blood in the stool  Significant tenderness or worsening of abdominal pains  Swelling of the abdomen that is new, acute  Fever of 100F or higher  For urgent or emergent issues, a gastroenterologist can be reached at any hour by calling [phone]. Do not use MyChart messaging for urgent concerns.    DIET:  We do recommend a small meal at first, but then you may proceed to your regular diet.  Drink plenty of fluids but you should avoid alcoholic beverages for 24 hours.  ACTIVITY:  You should plan to take it easy for the rest of today and you should NOT  DRIVE or use heavy machinery until tomorrow (because of the sedation medicines used during the test).    FOLLOW UP: Our staff will call the number listed on your records 48-72 hours following your procedure to check on you and address any questions or concerns that you may have regarding the information given to you following your procedure. If we do not reach you, we will leave a message.  We will attempt to reach you two times.  During this call, we will ask if you have developed any symptoms of COVID 19. If you develop any symptoms (ie: fever, flu-like symptoms, shortness of breath, cough etc.) before then, please call [phone].  If you test positive for Covid 19 in the 2 weeks post procedure, please call and report this information to [place].    If any biopsies were taken you will be contacted by phone or by letter within the next 1-3 weeks.  Please call us at [phone] if you have not heard about the biopsies in 3 weeks.    SIGNATURES/CONFIDENTIALITY: You and/or your care partner have signed paperwork which will be entered into your electronic medical record.  These signatures attest to the fact that that the information above on your After Visit Summary has been reviewed and is understood.  Full responsibility of the confidentiality of this discharge information lies with you and/or your care-partner.

## 2021-01-01 NOTE — Progress Notes (Signed)
PT taken to PACU. Monitors in place. VSS. Report given to RN. 

## 2021-01-05 ENCOUNTER — Telehealth: Payer: Self-pay | Admitting: *Deleted

## 2021-01-05 NOTE — Telephone Encounter (Signed)
  Follow up Call-  Call back number 01/01/2021  Post procedure Call Back phone  # [phone]  Permission to leave phone message Yes  Some recent data might be hidden     Patient questions:  Do you have a fever, pain , or abdominal swelling? No. Pain Score  0 *  Have you tolerated food without any problems? Yes.    Have you been able to return to your normal activities? Yes.    Do you have any questions about your discharge instructions: Diet   No. Medications  No. Follow up visit  No.  Do you have questions or concerns about your Care? No.  Actions: * If pain score is 4 or above: No action needed, pain <4.   Have you developed a fever since your procedure? no  2.   Have you had an respiratory symptoms (SOB or cough) since your procedure? no  3.   Have you tested positive for COVID 19 since your procedure no  4.   Have you had any family members/close contacts diagnosed with the COVID 19 since your procedure?  no   If yes to any of these questions please route to Joylene John, RN and Joella Prince, RN

## 2021-01-06 ENCOUNTER — Ambulatory Visit: Payer: 59 | Admitting: Nurse Practitioner

## 2021-01-06 ENCOUNTER — Encounter: Payer: Self-pay | Admitting: Nurse Practitioner

## 2021-01-06 ENCOUNTER — Other Ambulatory Visit: Payer: Self-pay

## 2021-01-06 VITALS — BP 122/80 | HR 98 | Temp 98.9°F | Ht 63.0 in | Wt 176.0 lb

## 2021-01-06 DIAGNOSIS — E6609 Other obesity due to excess calories: Secondary | ICD-10-CM | POA: Diagnosis not present

## 2021-01-06 DIAGNOSIS — Z6831 Body mass index (BMI) 31.0-31.9, adult: Secondary | ICD-10-CM | POA: Diagnosis not present

## 2021-01-06 DIAGNOSIS — F419 Anxiety disorder, unspecified: Secondary | ICD-10-CM | POA: Diagnosis not present

## 2021-01-06 DIAGNOSIS — R197 Diarrhea, unspecified: Secondary | ICD-10-CM | POA: Diagnosis not present

## 2021-01-06 MED ORDER — PHENTERMINE HCL 37.5 MG PO CAPS: 37.5000 mg | ORAL_CAPSULE | ORAL | 1 refills | Status: DC

## 2021-01-06 MED ORDER — DICYCLOMINE HCL 10 MG PO CAPS: 10.0000 mg | ORAL_CAPSULE | Freq: Three times a day (TID) | ORAL | 1 refills | Status: DC

## 2021-01-06 NOTE — Progress Notes (Signed)
I,Patricia French,acting as a Education administrator for Patricia Brine, FNP.,have documented all relevant documentation on the behalf of Patricia Brine, FNP,as directed by  Patricia Brine, FNP while in the presence of Patricia French, Patricia French.   This visit occurred during the SARS-CoV-2 public health emergency.  Safety protocols were in place, including screening questions prior to the visit, additional usage of staff PPE, and extensive cleaning of exam room while observing appropriate contact time as indicated for disinfecting solutions.  Subjective:     Patient ID: Patricia French , female    DOB: [birth year] , 45 y.o.   MRN: [id number]   Chief Complaint  Patient presents with   Obesity     HPI  Pt here to follow up with phentermine. Did not take from October 28th until Friday November 11. She likes the capsules better. She felt the medication ran out  Wt Readings from Last 3 Encounters: 01/06/21 : 176 lb (79.8 kg) 01/01/21 : 170 lb (77.1 kg) 12/09/20 : 170 lb (77.1 kg)  She ate at [employer] for lunch yesterday and a left over prime rib and baked potato for dinner   She reports stomach pains that started this morning along with diarrhea.   Diarrhea  This is a new problem. The current episode started today. The problem occurs 5 to 10 times per day. The stool consistency is described as Watery. The patient states that diarrhea awakens her from sleep. Associated symptoms include abdominal pain (cramping). Pertinent negatives include no arthralgias, chills, fever, headaches or myalgias. Risk factors include suspect food intake. She has tried bismuth subsalicylate for the symptoms. There is no history of bowel resection or malabsorption.    Past Medical History:  Diagnosis Date   Anxiety    HSV-2 (herpes simplex virus 2) infection    Hyperlipidemia      Family History  Problem Relation Age of Onset   Diabetes Mother    Hypertension Mother    Hyperlipidemia Mother    Heart disease Mother    Varicose Veins  Mother    Anxiety disorder Mother    Heart attack Father    Bipolar disorder Father    Schizophrenia Father    Hyperlipidemia Father    Alzheimer's disease Father    Cancer Maternal Grandmother    Migraines Maternal Grandmother    Hypertension Maternal Grandmother    Alzheimer's disease Paternal Grandmother    Leukemia Paternal Grandfather    Colon cancer Neg Hx    Colon polyps Neg Hx    Esophageal cancer Neg Hx    Rectal cancer Neg Hx    Stomach cancer Neg Hx      Current Outpatient Medications:    ALPRAZolam (XANAX) 0.5 MG tablet, alprazolam 0.5 mg tablet, Disp: , Rfl:    Ascorbic Acid (VITAMIN C) 1000 MG tablet, Take by mouth., Disp: , Rfl:    Cetirizine HCl 10 MG CAPS, cetirizine 10 mg capsule  Take by oral route., Disp: , Rfl:    Cyanocobalamin (VITAMIN B-12 PO), Vitamin B-12, Disp: , Rfl:    dicyclomine (BENTYL) 10 MG capsule, Take 1 capsule (10 mg total) by mouth 4 (four) times daily -  before meals and at bedtime., Disp: 30 capsule, Rfl: 1   estradiol (VIVELLE-DOT) 0.075 MG/24HR, Place 1 patch onto the skin daily at 6 (six) AM., Disp: , Rfl:    fluticasone (FLONASE) 50 MCG/ACT nasal spray, fluticasone propionate 50 mcg/actuation nasal spray,suspension, Disp: , Rfl:    Magnesium 250 MG TABS, Take 1 tablet (  250 mg total) by mouth daily. Take with evening meal, Disp: 90 tablet, Rfl: 1   phentermine 37.5 MG capsule, Take 1 capsule (37.5 mg total) by mouth every morning., Disp: 30 capsule, Rfl: 1   rosuvastatin (CRESTOR) 5 MG tablet, Take 5 mg by mouth daily at 6 (six) AM., Disp: , Rfl:    Saccharomyces boulardii (PROBIOTIC) 250 MG CAPS, Take 1 capsule by mouth daily., Disp: , Rfl:    valACYclovir (VALTREX) 500 MG tablet, Take 500 mg by mouth daily at 6 (six) AM., Disp: , Rfl:    Vitamin D, Ergocalciferol, (DRISDOL) 1.25 MG (50000 UNIT) CAPS capsule, Take 1 capsule by mouth daily at 6 (six) AM., Disp: , Rfl:    Zinc 20 MG CAPS, Take 1 capsule by mouth daily., Disp: , Rfl:     metroNIDAZOLE (FLAGYL) 500 MG tablet, Take 1 tablet (500 mg total) by mouth 3 (three) times daily., Disp: 30 tablet, Rfl: 0   ondansetron (ZOFRAN) 4 MG tablet, Take 1 tablet (4 mg total) by mouth every 8 (eight) hours as needed for nausea or vomiting., Disp: 20 tablet, Rfl: 0   Allergies  Allergen Reactions   Penicillins Hives   Pork-Derived Products      Review of Systems  Constitutional: Negative.  Negative for chills and fever.  Respiratory: Negative.    Cardiovascular: Negative.   Gastrointestinal:  Positive for abdominal pain (cramping) and diarrhea.  Musculoskeletal:  Negative for arthralgias and myalgias.  Neurological: Negative.  Negative for headaches.  Psychiatric/Behavioral: Negative.      Today's Vitals   01/06/21 1210  BP: 122/80  Pulse: 98  Temp: 98.9 F (37.2 C)  Weight: 176 lb (79.8 kg)  Height: 5\' 3"  (1.6 m)   Body mass index is 31.18 kg/m.  Wt Readings from Last 3 Encounters:  01/06/21 176 lb (79.8 kg)  01/01/21 170 lb (77.1 kg)  12/09/20 170 lb (77.1 kg)    Objective:  Physical Exam Vitals reviewed.  Constitutional:      General: She is not in acute distress.    Appearance: Normal appearance. She is obese.  Cardiovascular:     Rate and Rhythm: Normal rate and regular rhythm.     Pulses: Normal pulses.     Heart sounds: Normal heart sounds. No murmur heard. Pulmonary:     Effort: Pulmonary effort is normal. No respiratory distress.     Breath sounds: Normal breath sounds. No wheezing.  Abdominal:     General: Abdomen is flat. Bowel sounds are increased. There is no distension.     Palpations: Abdomen is soft. There is no mass.     Tenderness: There is generalized abdominal tenderness.  Skin:    General: Skin is warm and dry.     Capillary Refill: Capillary refill takes less than 2 seconds.  Neurological:     General: No focal deficit present.     Mental Status: She is alert and oriented to person, place, and time.     Cranial Nerves: No  cranial nerve deficit.     Motor: No weakness.  Psychiatric:        Mood and Affect: Mood normal.        Behavior: Behavior normal.        Thought Content: Thought content normal.        Judgment: Judgment normal.        Assessment And Plan:     1. Anxiety Comments: Stable, continue current medications  2. Class 1 obesity due  to excess calories without serious comorbidity with body mass index (BMI) of 31.0 to 31.9 in adult Will increase phentermine, she is tolerating well Will also check for metabolic causes - phentermine 37.5 MG capsule; Take 1 capsule (37.5 mg total) by mouth every morning.  Dispense: 30 capsule; Refill: 1 - Hemoglobin A1c - Insulin, random  3. Diarrhea, unspecified type Will check samples, due to the abdomen cramping will treat with bentyl. Will avoid antibiotic at this time to ensure is not a virus - Ova and parasite examination - Culture, Stool - Clostridium difficile Toxin A/B - dicyclomine (BENTYL) 10 MG capsule; Take 1 capsule (10 mg total) by mouth 4 (four) times daily -  before meals and at bedtime.  Dispense: 30 capsule; Refill: 1    Patient was given opportunity to ask questions. Patient verbalized understanding of the plan and was able to repeat key elements of the plan. All questions were answered to their satisfaction.  Patricia Brine, FNP    I, Patricia Brine, FNP, have reviewed all documentation for this visit. The documentation on 01/06/21 for the exam, diagnosis, procedures, and orders are all accurate and complete.   IF YOU HAVE BEEN REFERRED TO A SPECIALIST, IT MAY TAKE 1-2 WEEKS TO SCHEDULE/PROCESS THE REFERRAL. IF YOU HAVE NOT HEARD FROM US/SPECIALIST IN TWO WEEKS, PLEASE GIVE [place] A CALL AT [phone] X [phone].   THE PATIENT IS ENCOURAGED TO PRACTICE SOCIAL DISTANCING DUE TO THE COVID-19 PANDEMIC.

## 2021-01-07 LAB — HEMOGLOBIN A1C
Est. average glucose Bld gHb Est-mCnc: 105 mg/dL
Hgb A1c MFr Bld: 5.3 % (ref 4.8–5.6)

## 2021-01-07 LAB — INSULIN, RANDOM: INSULIN: 19.2 u[IU]/mL (ref 2.6–24.9)

## 2021-01-08 ENCOUNTER — Telehealth: Payer: Self-pay | Admitting: Gastroenterology

## 2021-01-08 ENCOUNTER — Encounter: Payer: Self-pay | Admitting: Nurse Practitioner

## 2021-01-08 NOTE — Telephone Encounter (Signed)
Patient had recent colonoscopy with Dr. Silverio Decamp.  When she got the follow-up call from [facility], she was doing fine.  However, on Tuesday at 4 a.m., she had "gut-wrenching" pain and explosive diarrhea which continued all day.  She went to see her PCP and was OK through the day on Wednesday, but by 7:30 Wednesday night, she was having the same pain and diarrhea as the day before.  She wanted to get some advice before the weekend as to whether she should return to her PCP or if she should come in to see Dr. Silverio Decamp.  Please call patient and advise.  Thank you.

## 2021-01-08 NOTE — Telephone Encounter (Signed)
Called the number listed. No answer. Left her a message on the voicemail.

## 2021-01-09 ENCOUNTER — Other Ambulatory Visit: Payer: Self-pay

## 2021-01-09 ENCOUNTER — Encounter: Payer: Self-pay | Admitting: Gastroenterology

## 2021-01-09 MED ORDER — METRONIDAZOLE 500 MG PO TABS: 500.0000 mg | ORAL_TABLET | Freq: Three times a day (TID) | ORAL | 0 refills | Status: DC

## 2021-01-09 MED ORDER — ONDANSETRON HCL 4 MG PO TABS: 4.0000 mg | ORAL_TABLET | Freq: Three times a day (TID) | ORAL | 0 refills | Status: DC | PRN

## 2021-01-09 NOTE — Telephone Encounter (Signed)
Patient is not answering the phone. Will continue to try and reach her. I have also sent her a message through her My Chart. It appears she does use this form of communication. Prescriptions to her listed pharmacy.  Follow up next week. Per Dr Silverio Decamp she will also send Zofran 4 mg every 8 hours PRN nausea or vomiting.

## 2021-01-09 NOTE — Telephone Encounter (Signed)
Her symptoms are concerning for acute gastroenteritis.  Please advise patient to submit stat GI pathogen panel. Please send prescription for Flagyl 500 mg 3 times daily for 10 days.  We may not have results of GI pathogen panel until Monday. Encouraged her to maintain hydration with adequate fluid intake.  Call with any worsening symptoms.  Thank you

## 2021-01-09 NOTE — Telephone Encounter (Signed)
Spoke with the patient.  She started having diarrhea on Tuesday 01/06/21. She had felt well until then. She describes the diarrhea as "explosive brown water" and everything going straight through her. She has urgency. Eating or drinking is causing terrible "gut wrenching pain." She feels very fatigued and has chills. She has stopped eating and knows she is probably dehydrated now. She was considering ER but is preferring not to do that. She had a follow up appointment with her PCP already scheduled but had this addressed at that appointment. Stool studies have been ordered and are pending. She was given dicyclomine 10 mg. She thought it was for nausea and has not been taking it.  No atb's in the last 30 days. No recent vaccinations. No recent travel or known sick contacts. Please advise.

## 2021-01-09 NOTE — Telephone Encounter (Signed)
Patient is requesting a call back states she did not get a call back yesterday. I advised her that someone would call her today to discuss her symptoms she is not feeling well at all.

## 2021-01-10 LAB — CLOSTRIDIUM DIFFICILE EIA: C difficile Toxins A+B, EIA: NEGATIVE

## 2021-01-12 LAB — STOOL CULTURE: E coli, Shiga toxin Assay: NEGATIVE

## 2021-01-13 ENCOUNTER — Other Ambulatory Visit: Payer: Self-pay | Admitting: Nurse Practitioner

## 2021-01-13 ENCOUNTER — Encounter: Payer: Self-pay | Admitting: Nurse Practitioner

## 2021-01-13 LAB — OVA AND PARASITE EXAMINATION

## 2021-01-18 ENCOUNTER — Encounter: Payer: Self-pay | Admitting: Nurse Practitioner

## 2021-02-02 ENCOUNTER — Encounter: Payer: Self-pay | Admitting: Nurse Practitioner

## 2021-02-03 ENCOUNTER — Telehealth (INDEPENDENT_AMBULATORY_CARE_PROVIDER_SITE_OTHER): Payer: 59 | Admitting: Nurse Practitioner

## 2021-02-03 ENCOUNTER — Encounter: Payer: Self-pay | Admitting: Nurse Practitioner

## 2021-02-03 DIAGNOSIS — J0111 Acute recurrent frontal sinusitis: Secondary | ICD-10-CM | POA: Diagnosis not present

## 2021-02-03 MED ORDER — AZITHROMYCIN 250 MG PO TABS: ORAL_TABLET | ORAL | 0 refills | Status: AC

## 2021-02-03 NOTE — Progress Notes (Signed)
Virtual Visit via video    This visit type was conducted due to national recommendations for restrictions regarding the COVID-19 Pandemic (e.g. social distancing) in an effort to limit this patient's exposure and mitigate transmission in our community.  Due to her co-morbid illnesses, this patient is at least at moderate risk for complications without adequate follow up.  This format is felt to be most appropriate for this patient at this time.  All issues noted in this document were discussed and addressed.  A limited physical exam was performed with this format.    This visit type was conducted due to national recommendations for restrictions regarding the COVID-19 Pandemic (e.g. social distancing) in an effort to limit this patient's exposure and mitigate transmission in our community.  Patients identity confirmed using two different identifiers.  This format is felt to be most appropriate for this patient at this time.  All issues noted in this document were discussed and addressed.  No physical exam was performed (except for noted visual exam findings with Video Visits).    Date:  02/03/2021   ID:  Patricia French, DOB [birth year], MRN [id number]  Patient Location:  Home   Provider location:   Office  Chief Complaint:  Sinus congestion, no fever. No cough HA, sinus pain. NO SOB, No chest pain.   History of Present Illness:    Patricia French is a 45 y.o. female who presents via video conferencing for a telehealth visit today.    The patient does have symptoms concerning for COVID-19 infection (fever, chills, cough, or new shortness of breath).   Patient presents for congestion. Her son was sick over the counter. He had chills and fever, sore throat. On Saturday she started feeling sick. But she has had no fever. Congested. Sore throat. She denies shortness of breath or  chest pain. She does have a history of sinus infections in the past and feels like she is starting to have that again.    Sinus Problem Associated symptoms include congestion, ear pain and headaches. Pertinent negatives include no chills, coughing or shortness of breath.    Past Medical History:  Diagnosis Date   Anxiety    HSV-2 (herpes simplex virus 2) infection    Hyperlipidemia    Past Surgical History:  Procedure Laterality Date   CESAREAN SECTION     CHOLECYSTECTOMY     keloid removal     PARTIAL HYSTERECTOMY     TONSILLECTOMY     tummy tuck       Current Meds  Medication Sig   azithromycin (ZITHROMAX) 250 MG tablet Take 2 tablets (500 mg) on  Day 1,  followed by 1 tablet (250 mg) once daily on Days 2 through 5.     Allergies:   Penicillins and Pork-derived products   Social History   Tobacco Use   Smoking status: Never   Smokeless tobacco: Never  Vaping Use   Vaping Use: Never used  Substance Use Topics   Alcohol use: Yes    Comment: occas.   Drug use: No     Family Hx: The patient's family history includes Alzheimer's disease in her father and paternal grandmother; Anxiety disorder in her mother; Bipolar disorder in her father; Cancer in her maternal grandmother; Diabetes in her mother; Heart attack in her father; Heart disease in her mother; Hyperlipidemia in her father and mother; Hypertension in her maternal grandmother and mother; Leukemia in her paternal grandfather; Migraines in her maternal grandmother; Schizophrenia in her father;  Varicose Veins in her mother. There is no history of Colon cancer, Colon polyps, Esophageal cancer, Rectal cancer, or Stomach cancer.  ROS:   Please see the history of present illness.    Review of Systems  Constitutional: Negative.  Negative for chills and fever.  HENT:  Positive for congestion, ear pain and sinus pain.        Tenderness to sinus   Respiratory: Negative.  Negative for cough, shortness of breath and wheezing.   Cardiovascular: Negative.  Negative for chest pain.  Gastrointestinal: Negative.   Musculoskeletal:  Negative for  myalgias.  Neurological:  Positive for headaches.  Psychiatric/Behavioral: Negative.     All other systems reviewed and are negative.   Labs/Other Tests and Data Reviewed:    Recent Labs: 10/22/2020: BUN 8; Creatinine, Ser 0.60; Hemoglobin 13.8; Platelets 266; Potassium 3.6; Sodium 137   Recent Lipid Panel No results found for: CHOL, TRIG, HDL, CHOLHDL, LDLCALC, LDLDIRECT  Wt Readings from Last 3 Encounters:  01/06/21 176 lb (79.8 kg)  01/01/21 170 lb (77.1 kg)  12/09/20 170 lb (77.1 kg)     Exam:    Vital Signs:  There were no vitals taken for this visit.    Physical Exam Vitals and nursing note reviewed.  HENT:     Head: Normocephalic and atraumatic.  Pulmonary:     Effort: Pulmonary effort is normal.  Neurological:     Mental Status: She is alert and oriented to person, place, and time.  Psychiatric:        Mood and Affect: Affect normal.    ASSESSMENT & PLAN:    1. Acute recurrent frontal sinusitis - azithromycin (ZITHROMAX) 250 MG tablet; Take 2 tablets (500 mg) on  Day 1,  followed by 1 tablet (250 mg) once daily on Days 2 through 5.  Dispense: 6 each; Refill: 0 -Can use OTC Flonase as needed -If her symptoms do not get better she is to call us or go to the urgent care.   COVID-19 Education: The signs and symptoms of COVID-19 were discussed with the patient and how to seek care for testing (follow up with PCP or arrange E-visit).  The importance of social distancing was discussed today.  Patient Risk:   After full review of this patients clinical status, I feel that they are at least moderate risk at this time.  Time:   Today, I have spent 10 minutes/ seconds with the patient with telehealth technology discussing above diagnoses.     Medication Adjustments/Labs and Tests Ordered: Current medicines are reviewed at length with the patient today.  Concerns regarding medicines are outlined above.   Tests Ordered: No orders of the defined types were placed in  this encounter.   Medication Changes: Meds ordered this encounter  Medications   azithromycin (ZITHROMAX) 250 MG tablet    Sig: Take 2 tablets (500 mg) on  Day 1,  followed by 1 tablet (250 mg) once daily on Days 2 through 5.    Dispense:  6 each    Refill:  0    Disposition:  Follow up prn  Signed, Bary Castilla, NP

## 2021-02-03 NOTE — Patient Instructions (Signed)

## 2021-02-17 ENCOUNTER — Ambulatory Visit: Payer: 59 | Admitting: Nurse Practitioner

## 2021-02-26 DIAGNOSIS — J329 Chronic sinusitis, unspecified: Secondary | ICD-10-CM | POA: Insufficient documentation

## 2021-03-17 ENCOUNTER — Encounter: Payer: Self-pay | Admitting: Nurse Practitioner

## 2021-03-17 ENCOUNTER — Ambulatory Visit: Payer: 59 | Admitting: Nurse Practitioner

## 2021-03-17 ENCOUNTER — Other Ambulatory Visit: Payer: Self-pay

## 2021-03-17 VITALS — BP 128/80 | HR 89 | Temp 98.1°F | Ht 63.0 in | Wt 171.6 lb

## 2021-03-17 DIAGNOSIS — E6609 Other obesity due to excess calories: Secondary | ICD-10-CM | POA: Diagnosis not present

## 2021-03-17 DIAGNOSIS — E782 Mixed hyperlipidemia: Secondary | ICD-10-CM | POA: Diagnosis not present

## 2021-03-17 DIAGNOSIS — Z6831 Body mass index (BMI) 31.0-31.9, adult: Secondary | ICD-10-CM

## 2021-03-17 DIAGNOSIS — S0300XS Dislocation of jaw, unspecified side, sequela: Secondary | ICD-10-CM

## 2021-03-17 DIAGNOSIS — M25511 Pain in right shoulder: Secondary | ICD-10-CM

## 2021-03-17 MED ORDER — PHENTERMINE HCL 37.5 MG PO CAPS: 37.5000 mg | ORAL_CAPSULE | ORAL | 1 refills | Status: DC

## 2021-03-17 MED ORDER — ROSUVASTATIN CALCIUM 5 MG PO TABS: 5.0000 mg | ORAL_TABLET | Freq: Every day | ORAL | 1 refills | Status: DC

## 2021-03-17 NOTE — Patient Instructions (Signed)

## 2021-03-17 NOTE — Progress Notes (Signed)
I,[name],acting as a Education administrator for [name], FNP.,have documented all relevant documentation on the behalf of [name], FNP,as directed by  [name], FNP while in the presence of [name], [place].   This visit occurred during the SARS-CoV-2 public health emergency.  Safety protocols were in place, including screening questions prior to the visit, additional usage of staff PPE, and extensive cleaning of exam room while observing appropriate contact time as indicated for disinfecting solutions.  Subjective:     Patient ID: Patricia French , female    DOB: 09-Apr-1975 , 46 y.o.   MRN: [id number]   Chief Complaint  Patient presents with   Weight Check    HPI  Pt presents today for phentermine f/u. Pt also reports right shoulder pain, doing certain ranges of motion.  She reports while being on medication, she cannot sleep. She will up until 4 in the morning, wide awake.    Starting weight: 180lbs Starting date: 11/13/20 Today's weight: 171 lbs Today's date: 03/17/2021  Total lbs lost to date: 9 lbs Total lbs lost since last in-office visit: 5 lbs Wt Readings from Last 3 Encounters: 03/17/21 : 171 lb 9.6 oz (77.8 kg) 01/06/21 : 176 lb (79.8 kg) 01/01/21 : 170 lb (77.1 kg)   Exercising: none Water intake: Drinks approximately 72 oz daily Diet: Regular trying to cut back on her portions and is taking an extra piece of bread off her meals.  She is taking phentermine 37.5 mg daily and if she is up late when she takes the medication, she is taking every other day. She takes the medication Thursday - Sunday then every other day. Would like to stay on phentermine 37.5 mg.    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   Denies any falls.       Past Medical History:  Diagnosis Date   Anxiety    HSV-2 (herpes simplex virus 2) infection    Hyperlipidemia      Family History   Problem Relation Age of Onset   Diabetes Mother    Hypertension Mother    Hyperlipidemia Mother    Heart disease Mother    Varicose Veins Mother    Anxiety disorder Mother    Heart attack Father    Bipolar disorder Father    Schizophrenia Father    Hyperlipidemia Father    Alzheimer's disease Father    Cancer Maternal Grandmother    Migraines Maternal Grandmother    Hypertension Maternal Grandmother    Alzheimer's disease Paternal Grandmother    Leukemia Paternal Grandfather    Colon cancer Neg Hx    Colon polyps Neg Hx    Esophageal cancer Neg Hx    Rectal cancer Neg Hx    Stomach cancer Neg Hx      Current Outpatient Medications:    ALPRAZolam (XANAX) 0.5 MG tablet, alprazolam 0.5 mg tablet, Disp: , Rfl:    Ascorbic Acid (VITAMIN C) 1000 MG tablet, Take by mouth., Disp: , Rfl:    Cetirizine HCl 10 MG CAPS, cetirizine 10 mg capsule  Take by oral route., Disp: , Rfl:    Cyanocobalamin (VITAMIN B-12 PO), Vitamin B-12, Disp: , Rfl:    estradiol (VIVELLE-DOT) 0.075 MG/24HR, Place 1 patch onto the skin daily at 6 (six) AM., Disp: , Rfl:    fluticasone (FLONASE) 50 MCG/ACT nasal spray, fluticasone propionate 50 mcg/actuation nasal  spray,suspension, Disp: , Rfl:    Magnesium 250 MG TABS, Take 1 tablet (250 mg total) by mouth daily. Take with evening meal, Disp: 90 tablet, Rfl: 1   rosuvastatin (CRESTOR) 5 MG tablet, Take 1 tablet (5 mg total) by mouth daily., Disp: 90 tablet, Rfl: 1   Saccharomyces boulardii (PROBIOTIC) 250 MG CAPS, Take 1 capsule by mouth daily., Disp: , Rfl:    valACYclovir (VALTREX) 500 MG tablet, Take 500 mg by mouth daily at 6 (six) AM., Disp: , Rfl:    Vitamin D, Ergocalciferol, (DRISDOL) 1.25 MG (50000 UNIT) CAPS capsule, Take 1 capsule by mouth daily at 6 (six) AM., Disp: , Rfl:    Zinc 20 MG CAPS, Take 1 capsule by mouth daily., Disp: , Rfl:    phentermine 37.5 MG capsule, Take 1 capsule (37.5 mg total) by mouth every morning., Disp: 30 capsule, Rfl: 1    Allergies  Allergen Reactions   Penicillins Hives   Pork-Derived Products      Review of Systems  Constitutional: Negative.   Respiratory: Negative.    Cardiovascular: Negative.   Musculoskeletal:  Positive for arthralgias (2-3 month hx left shoulder had bursitis now having right shoulder pain she is unable to lift her shirt off and has to turn her arm in and down. Feels like something is ripping into her shoulder blade. Tender to touch and sometimes will ache.).  Skin: Negative.   Neurological: Negative.   Psychiatric/Behavioral: Negative.      Today's Vitals   03/17/21 1507  BP: 128/80  Pulse: 89  Temp: 98.1 F (36.7 C)  Weight: 171 lb 9.6 oz (77.8 kg)  Height: 5\' 3"  (1.6 m)   Body mass index is 30.4 kg/m.  Wt Readings from Last 3 Encounters:  03/17/21 171 lb 9.6 oz (77.8 kg)  01/06/21 176 lb (79.8 kg)  01/01/21 170 lb (77.1 kg)    Objective:  Physical Exam Vitals reviewed.  Constitutional:      General: She is not in acute distress.    Appearance: Normal appearance.  Cardiovascular:     Rate and Rhythm: Normal rate and regular rhythm.  Musculoskeletal:        General: Tenderness (right upper shoulder and posterior back) present. No swelling.     Right shoulder: Tenderness present. No swelling or crepitus. Decreased range of motion. Normal strength. Normal pulse.     Left shoulder: No swelling, tenderness or crepitus. Normal range of motion. Normal strength. Normal pulse.     Right lower leg: No edema.     Left lower leg: No edema.  Skin:    General: Skin is warm and dry.     Capillary Refill: Capillary refill takes less than 2 seconds.  Neurological:     General: No focal deficit present.     Mental Status: She is alert and oriented to person, place, and time.     Cranial Nerves: No cranial nerve deficit.     Motor: No weakness.  Psychiatric:        Mood and Affect: Mood normal.        Behavior: Behavior normal.        Thought Content: Thought content  normal.        Judgment: Judgment normal.        Assessment And Plan:     1. Acute pain of right shoulder Comments: Decreased range of motion with extension - Ambulatory referral to Orthopedic Surgery  2. TMJ (dislocation of temporomandibular joint), sequela  3.  Mixed hyperlipidemia Stable, continue statin. Tolerating well.  - rosuvastatin (CRESTOR) 5 MG tablet; Take 1 tablet (5 mg total) by mouth daily.  Dispense: 90 tablet; Refill: 1 - Lipid panel - Liver Profile  4. Class 1 obesity due to excess calories without serious comorbidity with body mass index (BMI) of 31.0 to 31.9 in adult She is encouraged to strive for BMI less than 30 to decrease cardiac risk. Advised to aim for at least 150 minutes of exercise per week.  - phentermine 37.5 MG capsule; Take 1 capsule (37.5 mg total) by mouth every morning.  Dispense: 30 capsule; Refill: 1   Patient was given opportunity to ask questions. Patient verbalized understanding of the plan and was able to repeat key elements of the plan. All questions were answered to their satisfaction.  [name], FNP   I, [name], FNP, have reviewed all documentation for this visit. The documentation on 03/17/21 for the exam, diagnosis, procedures, and orders are all accurate and complete.   IF YOU HAVE BEEN REFERRED TO A SPECIALIST, IT MAY TAKE 1-2 WEEKS TO SCHEDULE/PROCESS THE REFERRAL. IF YOU HAVE NOT HEARD FROM US/SPECIALIST IN TWO WEEKS, PLEASE GIVE [place] A CALL AT [phone] X [phone].   THE PATIENT IS ENCOURAGED TO PRACTICE SOCIAL DISTANCING DUE TO THE COVID-19 PANDEMIC.

## 2021-03-18 LAB — HEPATIC FUNCTION PANEL
ALT: 23 IU/L (ref 0–32)
AST: 26 IU/L (ref 0–40)
Albumin: 4.9 g/dL — ABNORMAL HIGH (ref 3.8–4.8)
Alkaline Phosphatase: 83 IU/L (ref 44–121)
Bilirubin Total: 0.5 mg/dL (ref 0.0–1.2)
Bilirubin, Direct: 0.14 mg/dL (ref 0.00–0.40)
Total Protein: 7.1 g/dL (ref 6.0–8.5)

## 2021-03-18 LAB — LIPID PANEL
Chol/HDL Ratio: 3.7 ratio (ref 0.0–4.4)
Cholesterol, Total: 184 mg/dL (ref 100–199)
HDL: 50 mg/dL (ref >39)
LDL Chol Calc (NIH): 117 mg/dL — ABNORMAL HIGH (ref 0–99)
Triglycerides: 95 mg/dL (ref 0–149)
VLDL Cholesterol Cal: 17 mg/dL (ref 5–40)

## 2021-03-20 ENCOUNTER — Telehealth: Payer: Self-pay

## 2021-03-20 NOTE — Telephone Encounter (Signed)
Patient called wanting to know if you have looked at her labs she was advised that you have not had a chance to yet but will look at them soon. She also wanted me to let you know that adjusting her medication as helped her a lot she is now sleeping through the night. YL,RMA

## 2021-03-26 ENCOUNTER — Encounter: Payer: Self-pay | Admitting: Nurse Practitioner

## 2021-04-02 ENCOUNTER — Ambulatory Visit: Payer: 59 | Admitting: Surgery

## 2021-04-02 ENCOUNTER — Ambulatory Visit: Payer: Self-pay

## 2021-04-02 ENCOUNTER — Other Ambulatory Visit: Payer: Self-pay

## 2021-04-02 ENCOUNTER — Encounter: Payer: Self-pay | Admitting: Surgery

## 2021-04-02 VITALS — BP 135/89 | HR 80 | Ht 63.0 in | Wt 171.6 lb

## 2021-04-02 DIAGNOSIS — M7541 Impingement syndrome of right shoulder: Secondary | ICD-10-CM | POA: Diagnosis not present

## 2021-04-02 DIAGNOSIS — M542 Cervicalgia: Secondary | ICD-10-CM | POA: Diagnosis not present

## 2021-04-02 DIAGNOSIS — M25511 Pain in right shoulder: Secondary | ICD-10-CM

## 2021-04-02 MED ORDER — LIDOCAINE HCL 1 % IJ SOLN
3.0000 mL | INTRAMUSCULAR | Status: AC | PRN
  Administered 2021-04-02: 3 mL

## 2021-04-02 NOTE — Progress Notes (Signed)
Office Visit Note   Patient: Patricia French           Date of Birth: [birth year]           MRN: [id number] Visit Date: 04/02/2021              Requested by: [name], [address] [place],  [place] [address] PCP: [name], FNP   Assessment & Plan: Visit Diagnoses:  1. Acute pain of right shoulder   2. Neck pain on right side   3. Impingement syndrome of right shoulder     Plan: In hopes of giving patient some improvement of right shoulder pain offered subacromial injection.  Patient consent right shoulder is prepped with Betadine and subacromial Marcaine/Depo-Medrol injection was performed.  I do sitting for a few minutes patient reported very good relief with anesthetic in place.  No aggressive activity for couple days.  Follow-up in 3 weeks for recheck and if she has return of her pain I will schedule MRI right shoulder to rule out rotator cuff tear and other shoulder pathology.  I did review previous x-ray of the left shoulder that was done last year and she does have some calcific tendinopathy changes around the greater tuberosity.  If the shoulder becomes an issue again in the future she will let me know.  Follow-Up Instructions: Return in about 3 weeks (around 04/23/2021) for [place] RECHECK RIGHT SHOULDER PAIN.   Orders:  Orders Placed This Encounter  Procedures   Large Joint Inj: R subacromial bursa   XR Shoulder Right   XR Cervical Spine 2 or 3 views   No orders of the defined types were placed in this encounter.     Procedures: Large Joint Inj: R subacromial bursa on 04/02/2021 12:00 PM Indications: pain Details: 25 G 1.5 in needle, posterior approach Medications: 3 mL lidocaine 1 % Outcome: tolerated well, no immediate complications Consent was given by the patient. Patient was prepped and draped in the usual sterile fashion.      Clinical Data: No additional findings.   Subjective: Chief Complaint  Patient presents with   Right  Shoulder - Pain    HPI 46 year old female who is a new patient to clinic comes in today with complaints of right shoulder pain.  Patient states that she has had off-and-on bilateral shoulder issues about few years ago.  She was seen by primary care provider who did bilateral shoulder injections.  This gave good relief for a while.  Over the last 3 to 4 months she did have an increased pain in the right shoulder with overhead activity and internal rotation behind her back.  No injury.  No true cervical spine or radicular component.  States that she has had some discomfort in her right shoulder blade.  No numbness and tingling in the arm.  She has taken over-the-counter ibuprofen, Tylenol without any improvement.  She has also used a topical cream, Biofreeze and IcyHot. Review of Systems No current cardiac pulmonary GI GU issues  Objective: Vital Signs: BP 135/89    Pulse 80    Ht 5\' 3"  (1.6 m)    Wt 171 lb 9.6 oz (77.8 kg)    BMI 30.40 kg/m   Physical Exam HENT:     Head: Normocephalic.     Nose: Nose normal.  Eyes:     Extraocular Movements: Extraocular movements intact.  Pulmonary:     Effort: No respiratory distress.  Musculoskeletal:  Comments: Gait is normal.  Cervical spine good range of motion.  Mild bilateral brachial plexus tenderness..  Right shoulder she has good range of motion but with discomfort.  Positive impingement test.  Pain and weakness with supraspinatus resistance.  Proximal biceps tendon tender without tendon defect.  Negative drop arm test.  Neurological:     Mental Status: She is alert.  Psychiatric:        Mood and Affect: Mood normal.    Ortho Exam  Specialty Comments:  No specialty comments available.  Imaging: No results found.   PMFS History: Patient Active Problem List   Diagnosis Date Noted   Vitamin D deficiency 04/03/2018   Fibroid 07/02/2013   Menorrhagia 07/02/2013   Past Medical History:  Diagnosis Date   Anxiety    HSV-2 (herpes  simplex virus 2) infection    Hyperlipidemia     Family History  Problem Relation Age of Onset   Diabetes Mother    Hypertension Mother    Hyperlipidemia Mother    Heart disease Mother    Varicose Veins Mother    Anxiety disorder Mother    Heart attack Father    Bipolar disorder Father    Schizophrenia Father    Hyperlipidemia Father    Alzheimer's disease Father    Cancer Maternal Grandmother    Migraines Maternal Grandmother    Hypertension Maternal Grandmother    Alzheimer's disease Paternal Grandmother    Leukemia Paternal Grandfather    Colon cancer Neg Hx    Colon polyps Neg Hx    Esophageal cancer Neg Hx    Rectal cancer Neg Hx    Stomach cancer Neg Hx     Past Surgical History:  Procedure Laterality Date   CESAREAN SECTION     CHOLECYSTECTOMY     keloid removal     PARTIAL HYSTERECTOMY     TONSILLECTOMY     tummy tuck     Social History   Occupational History   Not on file  Tobacco Use   Smoking status: Never   Smokeless tobacco: Never  Vaping Use   Vaping Use: Never used  Substance and Sexual Activity   Alcohol use: Yes    Comment: occas.   Drug use: No   Sexual activity: Not on file

## 2021-04-23 ENCOUNTER — Encounter: Payer: Self-pay | Admitting: Surgery

## 2021-04-23 ENCOUNTER — Other Ambulatory Visit: Payer: Self-pay

## 2021-04-23 ENCOUNTER — Ambulatory Visit: Payer: Self-pay

## 2021-04-23 ENCOUNTER — Ambulatory Visit: Payer: 59 | Admitting: Surgery

## 2021-04-23 VITALS — BP 121/83 | HR 105 | Ht 63.0 in | Wt 171.6 lb

## 2021-04-23 DIAGNOSIS — M7541 Impingement syndrome of right shoulder: Secondary | ICD-10-CM

## 2021-04-23 DIAGNOSIS — M67442 Ganglion, left hand: Secondary | ICD-10-CM | POA: Diagnosis not present

## 2021-04-23 DIAGNOSIS — M79642 Pain in left hand: Secondary | ICD-10-CM | POA: Diagnosis not present

## 2021-04-23 NOTE — Progress Notes (Signed)
? ?Office Visit Note ?  ?Patient: Patricia French           ?Date of Birth: 07-05-1975           ?MRN: [id number] ?Visit Date: 04/23/2021 ?             ?Requested by: [name], [place] ?[address] ?[address] ?[place],  [place] [address] ?PCP: [name], FNP ? ? ?Assessment & Plan: ?Visit Diagnoses:  ?1. Pain of left hand   ?2. Ganglion cyst of flexor tendon sheath of finger of left hand   ?3. Impingement syndrome of right shoulder   ? ? ?Plan: Advised patient I think that she has a small ganglion cyst off the long finger flexor tendon around the A1 pulley.  Patient states that this is not causing a problem.  I will see her back in 6 weeks for recheck of her shoulder that is doing well and also her left hand.  If the ganglion cyst comes more problematic I may consider referring her to Dr. Roseanne Kaufman hand surgeon to consider possible excision.  If her shoulder pain returns I will plan to get MRI.  All questions answered. ? ?Follow-Up Instructions: Return in about 6 weeks (around 06/04/2021) for [facility] recheck the shoulder and left palmar ganglion.  ? ?Orders:  ?Orders Placed This Encounter  ?Procedures  ? XR Hand Complete Left  ? ?No orders of the defined types were placed in this encounter. ? ? ? ? Procedures: ?No procedures performed ? ? ?Clinical Data: ?No additional findings. ? ? ?Subjective: ?Chief Complaint  ?Patient presents with  ? Right Shoulder - Follow-up  ? ? ?HPI ?46 year old female returns for recheck of her right shoulder pain.  Last office visit I performed a subacromial Marcaine/Depo-Medrol injection.  She states that this gave complete relief of her pain.  She is very pleased at this point.  She has a new complaint today of a area of swelling in the left hand.  States that this area is around the A1 pulley of her long finger.  Not having any pain with this.  States that she noticed this when she was putting lotion on her hand.  The area of swelling is mobile. ? ? ?Objective: ?Vital Signs:  BP 121/83   Pulse (!) 105   Ht 5\' 3"  (1.6 m)   Wt 171 lb 9.6 oz (77.8 kg)   BMI 30.40 kg/m?  ? ?Physical Exam ?HENT:  ?   Head: Normocephalic and atraumatic.  ?Eyes:  ?   Extraocular Movements: Extraocular movements intact.  ?Pulmonary:  ?   Effort: No respiratory distress.  ?Musculoskeletal:  ?   Comments: Exam right shoulder she has full range of motion without pain.  Negative impingement testing.  Exam left hand on palpation today she does have what appears to be about a 2 to 3 mm mobile cystic mass around the long finger A1 pulley.  No triggering of the finger.  This area is nontender.  No redness or signs of infection.  Finger flexor and extensor tendons intact.  ?Psychiatric:     ?   Mood and Affect: Mood normal.  ? ? ?Ortho Exam ? ?Specialty Comments:  ?No specialty comments available. ? ?Imaging: ?No results found. ? ? ?PMFS History: ?Patient Active Problem List  ? Diagnosis Date Noted  ? Vitamin D deficiency 04/03/2018  ? Fibroid 07/02/2013  ? Menorrhagia 07/02/2013  ? ?Past Medical History:  ?Diagnosis Date  ? Anxiety   ? 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 Hyperlipidemia   ?  ?Family History  ?Problem Relation Age of Onset  ? Diabetes Mother   ? Hypertension Mother   ? Hyperlipidemia Mother   ? Heart disease Mother   ? Varicose Veins Mother   ? Anxiety disorder Mother   ? Heart attack Father   ? Bipolar disorder Father   ? Schizophrenia Father   ? Hyperlipidemia Father   ? Alzheimer's disease Father   ? Cancer Maternal Grandmother   ? Migraines Maternal Grandmother   ? Hypertension Maternal Grandmother   ? Alzheimer's disease Paternal Grandmother   ? Leukemia Paternal Grandfather   ? Colon cancer Neg Hx   ? Colon polyps Neg Hx   ? Esophageal cancer Neg Hx   ? Rectal cancer Neg Hx   ? Stomach cancer Neg Hx   ?  ?Past Surgical History:  ?Procedure Laterality Date  ? CESAREAN SECTION    ? CHOLECYSTECTOMY    ? keloid removal    ? PARTIAL HYSTERECTOMY    ? TONSILLECTOMY    ? tummy tuck     ? ?Social History  ? ?Occupational History  ? Not on file  ?Tobacco Use  ? Smoking status: Never  ? Smokeless tobacco: Never  ?Vaping Use  ? Vaping Use: Never used  ?Substance and Sexual Activity  ? Alcohol use: Yes  ?  Comment: occas.  ? Drug use: No  ? Sexual activity: Not on file  ? ? ? ? ? ? ?

## 2021-05-18 ENCOUNTER — Telehealth: Payer: Self-pay

## 2021-05-18 DIAGNOSIS — M25511 Pain in right shoulder: Secondary | ICD-10-CM

## 2021-05-18 DIAGNOSIS — M7541 Impingement syndrome of right shoulder: Secondary | ICD-10-CM

## 2021-05-18 NOTE — Telephone Encounter (Signed)
I spoke with Patricia French by phone. Verbal order given to obtain MRI shoulder without to R/O RCT and labral tear.   ?

## 2021-05-18 NOTE — Addendum Note (Signed)
Addended by: Meyer Cory on: 05/18/2021 04:24 PM ? ? Modules accepted: Orders ? ?

## 2021-05-18 NOTE — Telephone Encounter (Signed)
Patient called triage. She states that her right shoulder pain is returning since the injection that Jeneen Rinks gave her. She is wanting to move forward with an MRI as soon as she is able. ?Number to contact patient is [phone]. ?

## 2021-05-18 NOTE — Telephone Encounter (Signed)
MRI order entered. Patient aware.  ?

## 2021-05-21 ENCOUNTER — Telehealth: Payer: Self-pay | Admitting: Surgery

## 2021-05-21 ENCOUNTER — Ambulatory Visit: Payer: 59 | Admitting: Surgery

## 2021-05-21 ENCOUNTER — Other Ambulatory Visit: Payer: Self-pay | Admitting: Surgery

## 2021-05-21 MED ORDER — LORAZEPAM 0.5 MG PO TABS: ORAL_TABLET | ORAL | 0 refills | Status: DC

## 2021-05-21 NOTE — Telephone Encounter (Signed)
Jeneen Rinks pt ?

## 2021-05-21 NOTE — Telephone Encounter (Signed)
Spoke to [employer]. They wanted to confirm quantity. Should be 2 tablets per Benjiman Core. Pharmacy aware. ? ?

## 2021-05-21 NOTE — Telephone Encounter (Signed)
Pharmacy is calling on the lorazapam--needs clarification  ?

## 2021-05-25 ENCOUNTER — Encounter: Payer: Self-pay | Admitting: Nurse Practitioner

## 2021-05-25 ENCOUNTER — Ambulatory Visit: Payer: 59 | Admitting: Nurse Practitioner

## 2021-05-25 VITALS — BP 118/64 | HR 85 | Temp 98.1°F | Ht 63.0 in | Wt 165.0 lb

## 2021-05-25 DIAGNOSIS — Z6829 Body mass index (BMI) 29.0-29.9, adult: Secondary | ICD-10-CM

## 2021-05-25 DIAGNOSIS — E663 Overweight: Secondary | ICD-10-CM

## 2021-05-25 DIAGNOSIS — E6609 Other obesity due to excess calories: Secondary | ICD-10-CM

## 2021-05-25 NOTE — Progress Notes (Signed)
?Industrial/product designer as a Education administrator for [employer], FNP.,have documented all relevant documentation on the behalf of [name], FNP,as directed by  [name], FNP while in the presence of [name], [place]. ? ?This visit occurred during the SARS-CoV-2 public health emergency.  Safety protocols were in place, including screening questions prior to the visit, additional usage of staff PPE, and extensive cleaning of exam room while observing appropriate contact time as indicated for disinfecting solutions. ? ?Subjective:  ?  ? Patient ID: Patricia French , female    DOB: 1976/01/13 , 46 y.o.   MRN: [id number] ? ? ?Chief Complaint  ?Patient presents with  ? Weight Check  ? ? ?HPI ? ?Patient presents today for weight check. She is currently taking phentermine 37.5 mg. She is tolerating well and  is pleased with results. She is exercising 1-2 days a week at least 30 minutes, she continues to have problems with her shoulder and has an MRI scheduled for 06/06/2021 and time (difficult to fit in her schedule). Diet: she continues to eat more carbs than she should but feels her portion size is appropriate. She drinks at least 64 oz a day, unfortunately the last week had more sodas due to stress. She is not taking wellbutrin due to feeling like it made her shoulder "jump". She has an order for ativan for her MRI and is to clarify total number of tablets to dispen ? ?Wt Readings from Last 3 Encounters: ?05/25/21 : 165 lb (74.8 kg) ?04/23/21 : 171 lb 9.6 oz (77.8 kg) ?04/02/21 : 171 lb 9.6 oz (77.8 kg) ? ? ?  ? ?Past Medical History:  ?Diagnosis Date  ? Anxiety   ? HSV-2 (herpes simplex virus 2) infection   ? Hyperlipidemia   ?  ? ?Family History  ?Problem Relation Age of Onset  ? Diabetes Mother   ? Hypertension Mother   ? Hyperlipidemia Mother   ? Heart disease Mother   ? Varicose Veins Mother   ? Anxiety disorder Mother   ? Heart attack Father   ? Bipolar disorder Father   ? Schizophrenia Father   ? Hyperlipidemia Father    ? Alzheimer's disease Father   ? Cancer Maternal Grandmother   ? Migraines Maternal Grandmother   ? Hypertension Maternal Grandmother   ? Alzheimer's disease Paternal Grandmother   ? Leukemia Paternal Grandfather   ? Colon cancer Neg Hx   ? Colon polyps Neg Hx   ? Esophageal cancer Neg Hx   ? Rectal cancer Neg Hx   ? Stomach cancer Neg Hx   ? ? ? ?Current Outpatient Medications:  ?  LORazepam (ATIVAN) 0.5 MG tablet, Take one tablet one hour before MRI scan and another immediately before if needed.  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., Disp: 60 tablet, Rfl: 0 ?  ALPRAZolam (XANAX) 0.5 MG tablet, alprazolam 0.5 mg tablet, Disp: , Rfl:  ?  Ascorbic Acid (VITAMIN C) 1000 MG tablet, Take by mouth., Disp: , Rfl:  ?  Cetirizine HCl 10 MG CAPS, cetirizine 10 mg capsule  Take by oral route., Disp: , Rfl:  ?  Cyanocobalamin (VITAMIN B-12 PO), Vitamin B-12, Disp: , Rfl:  ?  estradiol (VIVELLE-DOT) 0.075 MG/24HR, Place 1 patch onto the skin daily at 6 (six) AM., Disp: , Rfl:  ?  fluticasone (FLONASE) 50 MCG/ACT nasal spray, fluticasone propionate 50 mcg/actuation nasal spray,suspension, Disp: , Rfl:  ?  Magnesium 250 MG TABS, Take 1 tablet (250 mg total) by mouth  daily. Take with evening meal, Disp: 90 tablet, Rfl: 1 ?  phentermine 37.5 MG capsule, Take 1 capsule (37.5 mg total) by mouth every morning., Disp: 30 capsule, Rfl: 1 ?  rosuvastatin (CRESTOR) 5 MG tablet, Take 1 tablet (5 mg total) by mouth daily., Disp: 90 tablet, Rfl: 1 ?  Saccharomyces boulardii (PROBIOTIC) 250 MG CAPS, Take 1 capsule by mouth daily., Disp: , Rfl:  ?  valACYclovir (VALTREX) 500 MG tablet, Take 500 mg by mouth daily at 6 (six) AM., Disp: , Rfl:  ?  Vitamin D, Ergocalciferol, (DRISDOL) 1.25 MG (50000 UNIT) CAPS capsule, Take 1 capsule by mouth daily at 6 (six) AM., Disp: , Rfl:  ?  Zinc 20 MG CAPS, Take 1 capsule by mouth daily., Disp: , Rfl:   ? ?Allergies  ?Allergen Reactions  ? Penicillins Hives  ? Pork-Derived Products   ?   ? ?Review of Systems  ?Constitutional: Negative.   ?Respiratory: Negative.    ?Cardiovascular: Negative.   ?Gastrointestinal: Negative.   ?Neurological: Negative.   ?Psychiatric/Behavioral: Negative.     ? ?Today's Vitals  ? 05/25/21 1536  ?BP: 118/64  ?Pulse: 85  ?Temp: 98.1 ?F (36.7 ?C)  ?TempSrc: Oral  ?Weight: 165 lb (74.8 kg)  ?Height: '5\' 3"'$  (1.6 m)  ? ?Body mass index is 29.23 kg/m?.  ? ?Objective:  ?Physical Exam ?Vitals reviewed.  ?Constitutional:   ?   General: She is not in acute distress. ?   Appearance: Normal appearance. She is obese.  ?Cardiovascular:  ?   Rate and Rhythm: Normal rate and regular rhythm.  ?   Pulses: Normal pulses.  ?   Heart sounds: Normal heart sounds. No murmur heard. ?Pulmonary:  ?   Effort: Pulmonary effort is normal. No respiratory distress.  ?   Breath sounds: Normal breath sounds. No wheezing.  ?Abdominal:  ?   General: Bowel sounds are increased.  ?Skin: ?   General: Skin is warm and dry.  ?   Capillary Refill: Capillary refill takes less than 2 seconds.  ?Neurological:  ?   General: No focal deficit present.  ?   Mental Status: She is alert and oriented to person, place, and time.  ?   Cranial Nerves: No cranial nerve deficit.  ?   Motor: No weakness.  ?Psychiatric:     ?   Mood and Affect: Mood normal.     ?   Behavior: Behavior normal.     ?   Thought Content: Thought content normal.     ?   Judgment: Judgment normal.  ?  ? ?   ?Assessment And Plan:  ?   ?1. Overweight with body mass index (BMI) of 29 to 29.9 in adult ?Comments: Weight is down 7 lbs, continue with phentermine tolerating well.  ?She is encouraged to strive for BMI less than 25 to decrease cardiac risk. Advised to aim for at least 150 minutes of exercise per week. ? ? ?Patient was given opportunity to ask questions. Patient verbalized understanding of the plan and was able to repeat key elements of the plan. All questions were answered to their satisfaction.  ?[name], FNP  ? ?I, [name], FNP, have  reviewed all documentation for this visit. The documentation on 05/25/21 for the exam, diagnosis, procedures, and orders are all accurate and complete.  ? ?IF YOU HAVE BEEN REFERRED TO A SPECIALIST, IT MAY TAKE 1-2 WEEKS TO SCHEDULE/PROCESS THE REFERRAL. IF YOU HAVE NOT HEARD FROM US/SPECIALIST IN TWO WEEKS, PLEASE  GIVE [place] A CALL AT [phone] X [phone].  ? ?THE PATIENT IS ENCOURAGED TO PRACTICE SOCIAL DISTANCING DUE TO THE COVID-19 PANDEMIC.   ?

## 2021-05-25 NOTE — Patient Instructions (Signed)
Obesity, Adult ?Obesity is having too much body fat. Being obese means that your weight is more than what is healthy for you.  ?BMI (body mass index) is a number that explains how much body fat you have. If you have a BMI of 30 or more, you are obese. ?Obesity can cause serious health problems, such as: ?Stroke. ?Coronary artery disease (CAD). ?Type 2 diabetes. ?Some types of cancer. ?High blood pressure (hypertension). ?High cholesterol. ?Gallbladder stones. ?Obesity can also contribute to: ?Osteoarthritis. ?Sleep apnea. ?Infertility problems. ?What are the causes? ?Eating meals each day that are high in calories, sugar, and fat. ?Drinking a lot of drinks that have sugar in them. ?Being born with genes that may make you more likely to become obese. ?Having a medical condition that causes obesity. ?Taking certain medicines. ?Sitting a lot (having a sedentary lifestyle). ?Not getting enough sleep. ?What increases the risk? ?Having a family history of obesity. ?Living in an area with limited access to: ?Parks, recreation centers, or sidewalks. ?Healthy food choices, such as grocery stores and farmers' markets. ?What are the signs or symptoms? ?The main sign is having too much body fat. ?How is this treated? ?Treatment for this condition often includes changing your lifestyle. Treatment may include: ?Changing your diet. This may include making a healthy meal plan. ?Exercise. This may include activity that causes your heart to beat faster (aerobic exercise) and strength training. Work with your doctor to design a program that works for you. ?Medicine to help you lose weight. This may be used if you are not able to lose one pound a week after 6 weeks of healthy eating and more exercise. ?Treating conditions that cause the obesity. ?Surgery. Options may include gastric banding and gastric bypass. This may be done if: ?Other treatments have not helped to improve your condition. ?You have a BMI of 40 or higher. ?You have  life-threatening health problems related to obesity. ?Follow these instructions at home: ?Eating and drinking ? ?Follow advice from your doctor about what to eat and drink. Your doctor may tell you to: ?Limit fast food, sweets, and processed snack foods. ?Choose low-fat options. For example, choose low-fat milk instead of whole milk. ?Eat five or more servings of fruits or vegetables each day. ?Eat at home more often. This gives you more control over what you eat. ?Choose healthy foods when you eat out. ?Learn to read food labels. This will help you learn how much food is in one serving. ?Keep low-fat snacks available. ?Avoid drinks that have a lot of sugar in them. These include soda, fruit juice, iced tea with sugar, and flavored milk. ?Drink enough water to keep your pee (urine) pale yellow. ?Do not go on fad diets. ?Physical activity ?Exercise often, as told by your doctor. Most adults should get up to 150 minutes of moderate-intensity exercise every week.Ask your doctor: ?What types of exercise are safe for you. ?How often you should exercise. ?Warm up and stretch before being active. ?Do slow stretching after being active (cool down). ?Rest between times of being active. ?Lifestyle ?Work with your doctor and a food expert (dietitian) to set a weight-loss goal that is best for you. ?Limit your screen time. ?Find ways to reward yourself that do not involve food. ?Do not drink alcohol if: ?Your doctor tells you not to drink. ?You are pregnant, may be pregnant, or are planning to become pregnant. ?If you drink alcohol: ?Limit how much you have to: ?0-1 drink a day for women. ?0-2 drinks   a day for men. ?Know how much alcohol is in your drink. In the [place] one drink equals one 12 oz bottle of beer (355 mL), one 5 oz glass of wine (148 mL), or one 1? oz glass of hard liquor (44 mL). ?General instructions ?Keep a weight-loss journal. This can help you keep track of: ?The food that you eat. ?How much exercise you  get. ?Take over-the-counter and prescription medicines only as told by your doctor. ?Take vitamins and supplements only as told by your doctor. ?Think about joining a support group. ?Pay attention to your mental health as obesity can lead to depression or self esteem issues. ?Keep all follow-up visits. ?Contact a doctor if: ?You cannot meet your weight-loss goal after you have changed your diet and lifestyle for 6 weeks. ?You are having trouble breathing. ?Summary ?Obesity is having too much body fat. ?Being obese means that your weight is more than what is healthy for you. ?Work with your doctor to set a weight-loss goal. ?Get regular exercise as told by your doctor. ?This information is not intended to replace advice given to you by your health care provider. Make sure you discuss any questions you have with your health care provider. ?Document Revised: 09/16/2020 Document Reviewed: 09/16/2020 ?Elsevier Patient Education ? [place]. ? ?

## 2021-06-02 ENCOUNTER — Other Ambulatory Visit: Payer: Self-pay | Admitting: Nurse Practitioner

## 2021-06-02 ENCOUNTER — Encounter: Payer: Self-pay | Admitting: Nurse Practitioner

## 2021-06-02 DIAGNOSIS — E6609 Other obesity due to excess calories: Secondary | ICD-10-CM

## 2021-06-02 MED ORDER — PHENTERMINE HCL 37.5 MG PO CAPS: 37.5000 mg | ORAL_CAPSULE | ORAL | 1 refills | Status: DC

## 2021-06-04 ENCOUNTER — Ambulatory Visit: Payer: 59 | Admitting: Surgery

## 2021-06-04 ENCOUNTER — Other Ambulatory Visit: Payer: Self-pay | Admitting: Nurse Practitioner

## 2021-06-04 MED ORDER — PHENTERMINE HCL 37.5 MG PO CAPS: 37.5000 mg | ORAL_CAPSULE | ORAL | 1 refills | Status: DC

## 2021-06-08 ENCOUNTER — Ambulatory Visit
Admission: RE | Admit: 2021-06-08 | Discharge: 2021-06-08 | Disposition: A | Discharge status: Home / Self Care | Payer: 59 | Source: Ambulatory Visit | Attending: Surgery | Admitting: Surgery

## 2021-06-08 DIAGNOSIS — M25511 Pain in right shoulder: Secondary | ICD-10-CM

## 2021-06-08 DIAGNOSIS — M7541 Impingement syndrome of right shoulder: Secondary | ICD-10-CM

## 2021-06-11 ENCOUNTER — Ambulatory Visit: Payer: 59 | Admitting: Surgery

## 2021-06-12 ENCOUNTER — Ambulatory Visit: Payer: 59 | Admitting: Surgery

## 2021-06-12 ENCOUNTER — Encounter: Payer: Self-pay | Admitting: Surgery

## 2021-06-12 VITALS — BP 143/95 | HR 76 | Ht 63.0 in | Wt 165.0 lb

## 2021-06-12 DIAGNOSIS — M7541 Impingement syndrome of right shoulder: Secondary | ICD-10-CM

## 2021-07-02 ENCOUNTER — Encounter: Payer: Self-pay | Admitting: Orthopedic Surgery

## 2021-07-02 ENCOUNTER — Telehealth: Payer: Self-pay | Admitting: Orthopedic Surgery

## 2021-07-02 ENCOUNTER — Ambulatory Visit: Payer: 59 | Admitting: Orthopedic Surgery

## 2021-07-02 DIAGNOSIS — M6788 Other specified disorders of synovium and tendon, other site: Secondary | ICD-10-CM

## 2021-07-02 DIAGNOSIS — M75121 Complete rotator cuff tear or rupture of right shoulder, not specified as traumatic: Secondary | ICD-10-CM | POA: Diagnosis not present

## 2021-07-02 NOTE — Progress Notes (Signed)
? ?Office Visit Note ?  ?Patient: Patricia French           ?Date of Birth: 1975-05-05           ?MRN: [id number] ?Visit Date: 07/02/2021 ?Requested by: [name], [place] ?[address] ?[address] ?[place],   [id number] ?PCP: [name], FNP ? ?Subjective: ?Chief Complaint  ?Patient presents with  ? Right Shoulder - Pain  ? ? ?HPI: Patricia French is a 46 y.o. female who presents to the office complaining of right shoulder pain.  Patient notes chronic right shoulder pain for several years with no history of injury.  She localizes pain to the anterior aspect of the shoulder with radiation up to the right side of her neck and down into the bicep muscle belly.  She has no numbness or tingling and denies any severe scapular pain but occasionally has soreness in the shoulder blade region.  She has tried over-the-counter medications as well as physical therapy, multiple injections without any lasting relief.  She has had no prior shoulder surgery.  Last injection was on 04/02/2021 by Benjiman Core, PA-C that gave relief for 3 to 4 weeks.  Pain does occasionally wake her up at night.  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  She enjoys dancing in her free time.  Never had surgery on the shoulder and has no history of diabetes.  She does not smoke.  She does have a history of keloid formation after surgeries.  Left shoulder bothers her to some degree as well but the right shoulder is the main problem.  Does not take any blood thinners..   ?             ?ROS: All systems reviewed are negative as they relate to the chief complaint within the history of present illness.  Patient denies fevers or chills. ? ?Assessment & Plan: ?Visit Diagnoses:  ?1. Nontraumatic complete tear of right rotator cuff   ?2. Biceps tendinosis of right upper extremity   ? ? ?Plan: Patient is a 46 year old female who presents for evaluation of right shoulder pain.  She has history of several  years of shoulder pain.  She has failed conservative management with no relief from PT, multiple injections, over-the-counter medications.  She would like to proceed with surgical intervention.  Currently scheduled for surgery on 07/13/2021.  She had recent MRI of the right shoulder demonstrating full-thickness tear of the supraspinatus with tendinosis of the intra-articular portion of the bicep tendon.  Think that she has pain coming from both of these problems.  Plan is to proceed with right shoulder arthroscopy with subsequent bicep tenodesis and rotator cuff repair.  Discussed the risks and benefits of the procedure including the risk of nerve/blood vessel damage, shoulder stiffness, need for revision surgery in the future, infection.  Discussed the recovery timeframe and how long she will be out of work.  After lengthy discussion, patient would still like to proceed with surgery.  Follow-up after procedure.  We will have to take care in order to reapproximate her incision very thoroughly to prevent keloid formation.  Anticipate dermatologic intervention possibly around the 3 to 4-week mark to minimize chances of keloid formation. ? ?Follow-Up Instructions: No follow-ups on file.  ? ?Orders:  ?No orders of the defined types were placed in this encounter. ? ?No orders of the defined types were placed in this encounter. ? ? ? ? Procedures: ?No procedures performed ? ? ?  Clinical Data: ?No additional findings. ? ?Objective: ?Vital Signs: There were no vitals taken for this visit. ? ?Physical Exam:  ?Constitutional: Patient appears well-developed ?HEENT:  ?Head: Normocephalic ?Eyes:EOM are normal ?Neck: Normal range of motion ?Cardiovascular: Normal rate ?Pulmonary/chest: Effort normal ?Neurologic: Patient is alert ?Skin: Skin is warm ?Psychiatric: Patient has normal mood and affect ? ?Ortho Exam: Ortho exam demonstrates right shoulder with 40 degrees X rotation, 100 degrees abduction, 160 degrees forward flexion.   Mild crepitus noted with passive motion of the shoulder at 90 degrees of abduction.  Tenderness over the bicipital groove.  Positive O'Brien sign.  5/5 motor strength of bilateral grip strength, finger abduction, pronation/supination, bicep, tricep, deltoid.  Axillary nerve intact with deltoid firing.  Excellent subscapularis strength with some reproduction of anterior pain.  5 -/5 strength of supra and infra with reproduction of pain.  No tenderness over the [facility] joint. ? ?Specialty Comments:  ?No specialty comments available. ? ?Imaging: ?No results found. ? ? ?PMFS History: ?Patient Active Problem List  ? Diagnosis Date Noted  ? Vitamin D deficiency 04/03/2018  ? Fibroid 07/02/2013  ? Menorrhagia 07/02/2013  ? ?Past Medical History:  ?Diagnosis Date  ? Anxiety   ? HSV-2 (herpes simplex virus 2) infection   ? Hyperlipidemia   ?  ?Family History  ?Problem Relation Age of Onset  ? Diabetes Mother   ? Hypertension Mother   ? Hyperlipidemia Mother   ? Heart disease Mother   ? Varicose Veins Mother   ? Anxiety disorder Mother   ? Heart attack Father   ? Bipolar disorder Father   ? Schizophrenia Father   ? Hyperlipidemia Father   ? Alzheimer's disease Father   ? Cancer Maternal Grandmother   ? Migraines Maternal Grandmother   ? Hypertension Maternal Grandmother   ? Alzheimer's disease Paternal Grandmother   ? Leukemia Paternal Grandfather   ? Colon cancer Neg Hx   ? Colon polyps Neg Hx   ? Esophageal cancer Neg Hx   ? Rectal cancer Neg Hx   ? Stomach cancer Neg Hx   ?  ?Past Surgical History:  ?Procedure Laterality Date  ? CESAREAN SECTION    ? CHOLECYSTECTOMY    ? keloid removal    ? PARTIAL HYSTERECTOMY    ? TONSILLECTOMY    ? tummy tuck    ? ?Social History  ? ?Occupational History  ? Not on file  ?Tobacco Use  ? Smoking status: Never  ? Smokeless tobacco: Never  ?Vaping Use  ? Vaping Use: Never used  ?Substance and Sexual Activity  ? Alcohol use: Yes  ?  Comment: occas.  ? Drug use: No  ? Sexual activity: Not on file   ? ? ? ? ?  ?

## 2021-07-02 NOTE — Telephone Encounter (Signed)
Received $25.00 cash, Medical records release form and FMLA paper work/ Forwarding to [employer] today ?

## 2021-07-03 ENCOUNTER — Encounter: Payer: Self-pay | Admitting: Orthopedic Surgery

## 2021-07-09 ENCOUNTER — Encounter: Payer: Self-pay | Admitting: Nurse Practitioner

## 2021-07-13 ENCOUNTER — Other Ambulatory Visit: Payer: Self-pay | Admitting: Surgical

## 2021-07-13 DIAGNOSIS — M7521 Bicipital tendinitis, right shoulder: Secondary | ICD-10-CM | POA: Diagnosis not present

## 2021-07-13 DIAGNOSIS — M75121 Complete rotator cuff tear or rupture of right shoulder, not specified as traumatic: Secondary | ICD-10-CM | POA: Diagnosis not present

## 2021-07-13 MED ORDER — CELECOXIB 100 MG PO CAPS: 100.0000 mg | ORAL_CAPSULE | Freq: Two times a day (BID) | ORAL | 0 refills | Status: DC

## 2021-07-13 MED ORDER — OXYCODONE-ACETAMINOPHEN 5-325 MG PO TABS: 1.0000 | ORAL_TABLET | ORAL | 0 refills | Status: AC | PRN

## 2021-07-13 MED ORDER — METHOCARBAMOL 500 MG PO TABS: 500.0000 mg | ORAL_TABLET | Freq: Three times a day (TID) | ORAL | 1 refills | Status: DC | PRN

## 2021-07-13 NOTE — Telephone Encounter (Signed)
I called  - likely block related

## 2021-07-13 NOTE — Telephone Encounter (Signed)
Patient came out of surgery and said her right hand is tingling and numb and a lot warmer than the rest of her body and her body is cold, please advise what steps she should take next.  [place],Marasia Daughter [phone]

## 2021-07-22 ENCOUNTER — Encounter: Payer: Self-pay | Admitting: Orthopedic Surgery

## 2021-07-22 ENCOUNTER — Ambulatory Visit (INDEPENDENT_AMBULATORY_CARE_PROVIDER_SITE_OTHER): Payer: 59 | Admitting: Surgical

## 2021-07-22 DIAGNOSIS — M75121 Complete rotator cuff tear or rupture of right shoulder, not specified as traumatic: Secondary | ICD-10-CM

## 2021-07-22 DIAGNOSIS — M6788 Other specified disorders of synovium and tendon, other site: Secondary | ICD-10-CM

## 2021-07-22 NOTE — Progress Notes (Signed)
Post-Op Visit Note   Patient: Patricia French           Date of Birth: 13-Jun-1975           MRN: [id number] Visit Date: 07/22/2021 PCP: [name], FNP   Assessment & Plan:  Chief Complaint:  Chief Complaint  Patient presents with   Right Shoulder - Routine Post Op   Visit Diagnoses:  1. Nontraumatic complete tear of right rotator cuff   2. Biceps tendinosis of right upper extremity     Plan: Patricia French is a 46 y.o. female who presents s/p right shoulder rotator cuff repair and biceps tenodesis on 07/13/2021.  Patient is doing well and pain is overall controlled.  Up to 87 degrees on CPM machine.  Denies any chest pain, SOB, fevers, chills.  Not taking any pain medication at all and she has not taken any pain meds since Saturday.  Only complains mostly of nighttime soreness but is able to sleep at night in her bed.  She has been compliant with using the sling and not lifting anything with the arm or lifting the arm under its own power.  On exam, patient has range of motion 20 degrees external rotation, 70 degrees abduction, 110 degrees forward flexion..  Intact EPL, FPL, finger abduction, finger adduction, pronation/supination, bicep, tricep, deltoid of operative extremity.  Axillary nerve intact with deltoid firing.  Incisions are healing well without evidence of infection or dehiscence; no evidence of keloid formation at this time.  Sutures removed and replaced with Steri-Strips today.  2+ radial pulse of the operative extremity  Plan is continue with CPM machine.  No lifting with the operative arm.  No active range of motion of the operative shoulder.  Follow-up in 2 weeks for clinical recheck for reevaluation and initiation of physical therapy.  Due to her propensity to form keloids, small amount of hydrocortisone cream was applied to the incisions today prior to Steri-Strip placement.  Recommended she follow-up with her dermatologist in 3 weeks for evaluation of her incisions and  for consideration of any options to further prevent/address any keloid formation..   Follow-Up Instructions: No follow-ups on file.   Orders:  No orders of the defined types were placed in this encounter.  No orders of the defined types were placed in this encounter.   Imaging: No results found.  PMFS History: Patient Active Problem List   Diagnosis Date Noted   Vitamin D deficiency 04/03/2018   Fibroid 07/02/2013   Menorrhagia 07/02/2013   Past Medical History:  Diagnosis Date   Anxiety    HSV-2 (herpes simplex virus 2) infection    Hyperlipidemia     Family History  Problem Relation Age of Onset   Diabetes Mother    Hypertension Mother    Hyperlipidemia Mother    Heart disease Mother    Varicose Veins Mother    Anxiety disorder Mother    Heart attack Father    Bipolar disorder Father    Schizophrenia Father    Hyperlipidemia Father    Alzheimer's disease Father    Cancer Maternal Grandmother    Migraines Maternal Grandmother    Hypertension Maternal Grandmother    Alzheimer's disease Paternal Grandmother    Leukemia Paternal Grandfather    Colon cancer Neg Hx    Colon polyps Neg Hx    Esophageal cancer Neg Hx    Rectal cancer Neg Hx    Stomach cancer Neg Hx     Past Surgical History:  Procedure Laterality Date   CESAREAN SECTION     CHOLECYSTECTOMY     keloid removal     PARTIAL HYSTERECTOMY     TONSILLECTOMY     tummy tuck     Social History   Occupational History   Not on file  Tobacco Use   Smoking status: Never   Smokeless tobacco: Never  Vaping Use   Vaping Use: Never used  Substance and Sexual Activity   Alcohol use: Yes    Comment: occas.   Drug use: No   Sexual activity: Not on file

## 2021-07-28 ENCOUNTER — Encounter: Payer: 59 | Admitting: Nurse Practitioner

## 2021-07-28 ENCOUNTER — Encounter: Payer: Self-pay | Admitting: Nurse Practitioner

## 2021-07-28 NOTE — Patient Instructions (Signed)

## 2021-07-28 NOTE — Progress Notes (Signed)
Appt canceled!

## 2021-08-04 ENCOUNTER — Encounter: Payer: Self-pay | Admitting: Surgical

## 2021-08-09 ENCOUNTER — Other Ambulatory Visit: Payer: Self-pay | Admitting: Surgical

## 2021-08-17 ENCOUNTER — Encounter: Payer: Self-pay | Admitting: Orthopedic Surgery

## 2021-08-17 ENCOUNTER — Telehealth: Payer: Self-pay | Admitting: Orthopedic Surgery

## 2021-08-17 ENCOUNTER — Ambulatory Visit (INDEPENDENT_AMBULATORY_CARE_PROVIDER_SITE_OTHER): Payer: 59 | Admitting: Orthopedic Surgery

## 2021-08-17 DIAGNOSIS — M75121 Complete rotator cuff tear or rupture of right shoulder, not specified as traumatic: Secondary | ICD-10-CM

## 2021-08-17 NOTE — Telephone Encounter (Signed)
Patient called. She would like to extend her work note to 7/17. Her call back number is [phone]

## 2021-08-18 ENCOUNTER — Ambulatory Visit (INDEPENDENT_AMBULATORY_CARE_PROVIDER_SITE_OTHER): Payer: 59 | Admitting: Rehabilitative and Restorative Service Providers"

## 2021-08-18 ENCOUNTER — Encounter: Payer: Self-pay | Admitting: Rehabilitative and Restorative Service Providers"

## 2021-08-18 DIAGNOSIS — G8929 Other chronic pain: Secondary | ICD-10-CM

## 2021-08-18 DIAGNOSIS — M25511 Pain in right shoulder: Secondary | ICD-10-CM | POA: Diagnosis not present

## 2021-08-18 DIAGNOSIS — R600 Localized edema: Secondary | ICD-10-CM

## 2021-08-18 DIAGNOSIS — R293 Abnormal posture: Secondary | ICD-10-CM

## 2021-08-18 DIAGNOSIS — M6281 Muscle weakness (generalized): Secondary | ICD-10-CM

## 2021-08-20 ENCOUNTER — Encounter: Payer: Self-pay | Admitting: Orthopedic Surgery

## 2021-08-20 NOTE — Progress Notes (Signed)
Post-Op Visit Note   Patient: Patricia French           Date of Birth: 12/21/1975           MRN: [id number] Visit Date: 08/17/2021 PCP: [name], FNP   Assessment & Plan:  Chief Complaint:  Chief Complaint  Patient presents with   Right Shoulder - Routine Post Op    right shoulder rotator cuff repair and biceps tenodesis on 07/13/2021   Visit Diagnoses:  1. Nontraumatic complete tear of right rotator cuff     Plan: Patient presents now 4 weeks out right shoulder arthroscopy rotator cuff repair biceps tenodesis.  She has been in a sling.  Reports some increase in pain over the last weeks and surgery.  No physical therapy yet.  Not taking any pain medicine because oxycodone really did not help.  Finished a CPM last week and she made it to 120.  On examination she has a little bit of popping in the shoulder but she did have a large rotator cuff tear.  Feels more like postop scarring as opposed to recurrent cuff tear.  Would like for her to start physical therapy here 3 times a week for 4 weeks for range of motion both active assisted and passive for the first 2 weeks then okay to add strengthening.  Pictures reviewed with the patient today.  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  Follow-up in 4 weeks which will be 1 week after she starts her strengthening protocol.  Range of motion today is 20/90/110.  Follow-Up Instructions: Return in about 4 weeks (around 09/14/2021).   Orders:  Orders Placed This Encounter  Procedures   Ambulatory referral to Physical Therapy   No orders of the defined types were placed in this encounter.   Imaging: No results found.  PMFS History: Patient Active Problem List   Diagnosis Date Noted   Vitamin D deficiency 04/03/2018   Fibroid 07/02/2013   Menorrhagia 07/02/2013   Past Medical History:  Diagnosis Date   Anxiety    HSV-2 (herpes simplex virus 2) infection    Hyperlipidemia     Family History   Problem Relation Age of Onset   Diabetes Mother    Hypertension Mother    Hyperlipidemia Mother    Heart disease Mother    Varicose Veins Mother    Anxiety disorder Mother    Heart attack Father    Bipolar disorder Father    Schizophrenia Father    Hyperlipidemia Father    Alzheimer's disease Father    Cancer Maternal Grandmother    Migraines Maternal Grandmother    Hypertension Maternal Grandmother    Alzheimer's disease Paternal Grandmother    Leukemia Paternal Grandfather    Colon cancer Neg Hx    Colon polyps Neg Hx    Esophageal cancer Neg Hx    Rectal cancer Neg Hx    Stomach cancer Neg Hx     Past Surgical History:  Procedure Laterality Date   CESAREAN SECTION     CHOLECYSTECTOMY     keloid removal     PARTIAL HYSTERECTOMY     TONSILLECTOMY     tummy tuck     Social History   Occupational History   Not on file  Tobacco Use   Smoking status: Never   Smokeless tobacco: Never  Vaping Use   Vaping Use: Never used  Substance and Sexual Activity   Alcohol use: Yes  Comment: occas.   Drug use: No   Sexual activity: Not on file

## 2021-08-24 ENCOUNTER — Ambulatory Visit: Payer: 59 | Admitting: Physical Therapy

## 2021-08-24 ENCOUNTER — Encounter: Payer: Self-pay | Admitting: Physical Therapy

## 2021-08-24 DIAGNOSIS — M6281 Muscle weakness (generalized): Secondary | ICD-10-CM

## 2021-08-24 DIAGNOSIS — R600 Localized edema: Secondary | ICD-10-CM

## 2021-08-24 DIAGNOSIS — R293 Abnormal posture: Secondary | ICD-10-CM | POA: Diagnosis not present

## 2021-08-24 DIAGNOSIS — M25511 Pain in right shoulder: Secondary | ICD-10-CM | POA: Diagnosis not present

## 2021-08-24 DIAGNOSIS — G8929 Other chronic pain: Secondary | ICD-10-CM

## 2021-08-24 NOTE — Therapy (Signed)
OUTPATIENT PHYSICAL THERAPY TREATMENT NOTE   Patient Name: Patricia French MRN: [id number] DOB:12/23/1975, 46 y.o., female Today's Date: 08/24/2021   END OF SESSION:   PT End of Session - 08/24/21 1447     Visit Number 2    Number of Visits 20    Date for PT Re-Evaluation 10/27/21    PT Start Time 2119    PT Stop Time 1510    PT Time Calculation (min) 38 min    Activity Tolerance Patient tolerated treatment well    Behavior During Therapy WFL for tasks assessed/performed             Past Medical History:  Diagnosis Date   Anxiety    HSV-2 (herpes simplex virus 2) infection    Hyperlipidemia    Past Surgical History:  Procedure Laterality Date   CESAREAN SECTION     CHOLECYSTECTOMY     keloid removal     PARTIAL HYSTERECTOMY     TONSILLECTOMY     tummy tuck     Patient Active Problem List   Diagnosis Date Noted   Vitamin D deficiency 04/03/2018   Fibroid 07/02/2013   Menorrhagia 07/02/2013   THERAPY DIAG:  Chronic right shoulder pain  Muscle weakness (generalized)  Localized edema  Abnormal posture  PCP: [name] FNP   REFERRING PROVIDER: Meredith Pel, MD   REFERRING DIAG: [phone] (ICD-10-CM) - Nontraumatic complete tear of right rotator cuff   Rationale for Evaluation and Treatment Rehabilitation   ONSET DATE: Surgery 07/13/2021   SUBJECTIVE:                                                                                                                                                                                       SUBJECTIVE STATEMENT: Rt shoulder is more sore than painful   PERTINENT HISTORY: Unremarkable     PAIN:  NPRS scale: no pain, just sore Pain location: Rt shoulder Pain description: heavy, sharp at times, ache at times Aggravating factors: arm movements, lying down Relieving factors: ice, machine for mobility   PRECAUTIONS: Shoulder - RTC repair c biceps tenodesis.  ROM active/passive for 2 weeks(until  09/01/2021) then ok for strengthening   WEIGHT BEARING RESTRICTIONS No   OBJECTIVE:    PATIENT SURVEYS:   08/18/2021 FOTO intake:   45  predicted:   65  POSTURE: 08/18/2021 Mild rounded shoulders in sitting, easily corrected c verbal cues.    UPPER EXTREMITY ROM:                08/18/2021:  pain end range limited for Rt shoulder .  Seated Rt shoulder elevation flexion to 65 degrees c  shrug noted   AROM (PROM) Right 08/18/2021 Left 08/18/2021 Right 08/24/21  Shoulder flexion 110 (115) WFL AA: 131  Shoulder extension       Shoulder abduction 90(95)     Shoulder adduction       Shoulder internal rotation 50 in 30 deg abduction     Shoulder external rotation 20 in 30 deg abduction   AA: 40  Elbow flexion       Elbow extension       Wrist flexion       Wrist extension       Wrist ulnar deviation       Wrist radial deviation       Wrist pronation       Wrist supination       (Blank rows = not tested)   UPPER EXTREMITY MMT:   MMT Right 08/18/2021 Not tested due to surgery protocol c resistance    Shoulder flexion      Shoulder extension      Shoulder abduction      Shoulder adduction      Shoulder internal rotation      Shoulder external rotation      Middle trapezius      Lower trapezius      Elbow flexion      Elbow extension      Wrist flexion      Wrist extension      Wrist ulnar deviation      Wrist radial deviation      Wrist pronation      Wrist supination      Grip strength (lbs)      (Blank rows = not tested)   SHOULDER SPECIAL TESTS: 08/18/2021  : none tested   JOINT MOBILITY TESTING:  08/18/2021 No restriction inferior jt mobs Rt GH joint in mid range   PALPATION:  08/18/2021 Mild tenderness Rt anterior/posterior shoulder generally.              Visual inspection of localized edema Rt shoulder              TODAY'S TREATMENT:  08/24/21 Therex: Scapular retraction x 10 reps AA supine shoulder flexion x 10 reps, 1# bar AA supine shoulder ER x 10 reps, 1#  bar Pulleys flexion and scaption x 3 min each Wall walk flexion x 10 reps (no ladder)  Manual: Rt shoulder PROM to tolerance; then Gr 2-3 A/P and inf mobs   08/18/2021    Therex:             HEP instruction/performance c cues for techniques, handout provided.  Trial set performed of each for comprehension and symptom assessment.  See below for exercise list.                              PATIENT EDUCATION: 08/18/2021 Education details: HEP, POC Person educated: Patient Education method: Explanation, Demonstration, Verbal cues, and Handouts Education comprehension: verbalized understanding, returned demonstration, and verbal cues required     HOME EXERCISE PROGRAM: Access Code: [email or web address] URL: [email or web address] Date: 08/18/2021 Prepared by: Scot Jun   Exercises - Supine Shoulder Flexion Extension AAROM with Dowel  - 2-3 x daily - 7 x weekly - 1-2 sets - 10 reps - 5 hold - Supine Shoulder External Rotation with Dowel  - 2-3 x daily - 7 x weekly - 1-2 sets - 10 reps - 5 hold - Seated Scapular Retraction  -  2-3 x daily - 7 x weekly - 1-2 sets - 10 reps - 5 hold   ASSESSMENT:   CLINICAL IMPRESSION: Pt demonstrating excellent improvement in flexion and ER rotation at this time and overall tolerating ROM exercises well.  Will continue to benefit from PT to maximize function.   OBJECTIVE IMPAIRMENTS decreased activity tolerance, decreased coordination, decreased endurance, decreased mobility, decreased ROM, decreased strength, hypomobility, increased edema, increased fascial restrictions, impaired perceived functional ability, increased muscle spasms, impaired flexibility, impaired UE functional use, improper body mechanics, postural dysfunction, and pain.    ACTIVITY LIMITATIONS carrying, lifting, sleeping, bathing, toileting, dressing, and reach over head   PARTICIPATION LIMITATIONS: meal prep, cleaning, laundry, interpersonal relationship, driving, community  activity, occupation, and yard work   PERSONAL FACTORS No specific factors are affecting patient's functional outcome.    REHAB POTENTIAL: Good   CLINICAL DECISION MAKING: Stable/uncomplicated   EVALUATION COMPLEXITY: Low     GOALS: Goals reviewed with patient? Yes   Short term PT Goals (target date for Short term goals are 3 weeks 09/08/2021) Patient will demonstrate independent use of home exercise program to maintain progress from in clinic treatments. Goal status: Ongoing 08/24/21   Long term PT goals (target dates for all long term goals are 10 weeks  10/27/2021 )   1. Patient will demonstrate/report pain at worst less than or equal to 2/10 to facilitate minimal limitation in daily activity secondary to pain symptoms. Goal status: New   2. Patient will demonstrate independent use of home exercise program to facilitate ability to maintain/progress functional gains from skilled physical therapy services. Goal status: New   3. Patient will demonstrate FOTO outcome > or = 65 % to indicate reduced disability due to condition. Goal status: New   4.  Patient will demonstrate Rt [place] joint mobility WFL to facilitate usual self care, dressing, reaching overhead at PLOF s limitation due to symptoms.  Goal status: New   5.  Patient will demonstrate/report ability to return to work at [employer].    Goal status: New   6.  Patient will demonstrate ability to lift/carry grandkids for normal life activity.   Goal status: New       PLAN: PT FREQUENCY:  2-3x/week to start then 1-2 when appropriate   PT DURATION: 10 weeks   PLANNED INTERVENTIONS: Therapeutic exercises, Therapeutic activity, Neuro Muscular re-education, Balance training, Gait training, Patient/Family education, Joint mobilization, Stair training, DME instructions, Dry Needling, Electrical stimulation, Cryotherapy, Moist heat, Taping, Ultrasound, Ionotophoresis '4mg'$ /ml Dexamethasone, and Manual therapy.  All included unless  contraindicated   PLAN FOR NEXT SESSION: progressive ROM gains (no strengthening for rotator cuff/ biceps until 09/01/2021)    Faustino Congress, PT 08/24/2021, 3:11 PM

## 2021-08-28 ENCOUNTER — Encounter: Payer: 59 | Admitting: Rehabilitative and Restorative Service Providers"

## 2021-08-31 ENCOUNTER — Encounter: Payer: Self-pay | Admitting: Rehabilitative and Restorative Service Providers"

## 2021-08-31 ENCOUNTER — Ambulatory Visit: Payer: 59 | Admitting: Rehabilitative and Restorative Service Providers"

## 2021-08-31 DIAGNOSIS — G8929 Other chronic pain: Secondary | ICD-10-CM

## 2021-08-31 DIAGNOSIS — M25511 Pain in right shoulder: Secondary | ICD-10-CM

## 2021-08-31 DIAGNOSIS — R293 Abnormal posture: Secondary | ICD-10-CM | POA: Diagnosis not present

## 2021-08-31 DIAGNOSIS — M6281 Muscle weakness (generalized): Secondary | ICD-10-CM

## 2021-08-31 DIAGNOSIS — R600 Localized edema: Secondary | ICD-10-CM | POA: Diagnosis not present

## 2021-08-31 NOTE — Therapy (Signed)
OUTPATIENT PHYSICAL THERAPY TREATMENT NOTE   Patient Name: Patricia French MRN: [id number] DOB:12/05/1975, 46 y.o., female Today's Date: 08/31/2021   END OF SESSION:   PT End of Session - 08/31/21 1308     Visit Number 3    Number of Visits 20    Date for PT Re-Evaluation 10/27/21    PT Start Time 1303    PT Stop Time 1342    PT Time Calculation (min) 39 min    Activity Tolerance Patient tolerated treatment well    Behavior During Therapy [facility] for tasks assessed/performed              Past Medical History:  Diagnosis Date   Anxiety    HSV-2 (herpes simplex virus 2) infection    Hyperlipidemia    Past Surgical History:  Procedure Laterality Date   CESAREAN SECTION     CHOLECYSTECTOMY     keloid removal     PARTIAL HYSTERECTOMY     TONSILLECTOMY     tummy tuck     Patient Active Problem List   Diagnosis Date Noted   Vitamin D deficiency 04/03/2018   Fibroid 07/02/2013   Menorrhagia 07/02/2013   THERAPY DIAG:  Chronic right shoulder pain  Muscle weakness (generalized)  Localized edema  Abnormal posture  PCP: [name] FNP   REFERRING PROVIDER: Meredith Pel, MD   REFERRING DIAG: [phone] (ICD-10-CM) - Nontraumatic complete tear of right rotator cuff   Rationale for Evaluation and Treatment Rehabilitation   ONSET DATE: Surgery 07/13/2021   SUBJECTIVE:                                                                                                                                                                                       SUBJECTIVE STATEMENT: Pt stated no pain to report in last week.  Reported some soreness after exercises.    PERTINENT HISTORY: Unremarkable     PAIN:  NPRS scale: no pain, just sore Pain location: Rt shoulder Pain description: heavy, sharp at times, ache at times Aggravating factors: arm movements, lying down Relieving factors: ice, machine for mobility   PRECAUTIONS: Shoulder - RTC repair c biceps  tenodesis.  ROM active/passive for 2 weeks(until 09/01/2021) then ok for strengthening   WEIGHT BEARING RESTRICTIONS No   OBJECTIVE:    PATIENT SURVEYS:   08/18/2021 FOTO intake:   45  predicted:   65  POSTURE: 08/18/2021 Mild rounded shoulders in sitting, easily corrected c verbal cues.    UPPER EXTREMITY ROM:                08/18/2021:  pain end range limited for Rt shoulder .  Seated Rt shoulder elevation flexion to 65 degrees c shrug noted   AROM (PROM) Right 08/18/2021 Left 08/18/2021 Right 08/24/21 Right AROM in supine  08/31/2021  Shoulder flexion 110 (115) WFL AA: 131 144  Shoulder extension        Shoulder abduction 90(95)    100  Shoulder adduction        Shoulder internal rotation 50 in 30 deg abduction      Shoulder external rotation 20 in 30 deg abduction   AA: 40 43 in 30 deg abduction  Elbow flexion        Elbow extension        Wrist flexion        Wrist extension        Wrist ulnar deviation        Wrist radial deviation        Wrist pronation        Wrist supination        (Blank rows = not tested)   UPPER EXTREMITY MMT:   MMT Right 08/18/2021 Not tested due to surgery protocol c resistance    Shoulder flexion      Shoulder extension      Shoulder abduction      Shoulder adduction      Shoulder internal rotation      Shoulder external rotation      Middle trapezius      Lower trapezius      Elbow flexion      Elbow extension      Wrist flexion      Wrist extension      Wrist ulnar deviation      Wrist radial deviation      Wrist pronation      Wrist supination      Grip strength (lbs)      (Blank rows = not tested)   SHOULDER SPECIAL TESTS: 08/18/2021  : none tested   JOINT MOBILITY TESTING:  08/18/2021 No restriction inferior jt mobs Rt GH joint in mid range   PALPATION:  08/18/2021 Mild tenderness Rt anterior/posterior shoulder generally.              Visual inspection of localized edema Rt shoulder              TODAY'S TREATMENT:   08/31/2021: Manual:  Compression c movement to Rt infraspinatus for trigger point release (noted improvement in flexion mobility post manual intervention)  Therex:  Supine Rt shoulder AROM flexion  x 15  Sidelying Rt shoulder ER 3 x 10  Sidelying Rt shoulder abduction x 15  UBE fwd/back 3 mins each way lvl 2.0    08/24/21 Therex: Scapular retraction x 10 reps AA supine shoulder flexion x 10 reps, 1# bar AA supine shoulder ER x 10 reps, 1# bar Pulleys flexion and scaption x 3 min each Wall walk flexion x 10 reps (no ladder)  Manual: Rt shoulder PROM to tolerance; then Gr 2-3 A/P and inf mobs     PATIENT EDUCATION: 08/31/2021 Education details: HEP progression Person educated: Patient Education method: Consulting civil engineer, Media planner, Verbal cues, and Handouts Education comprehension: verbalized understanding, returned demonstration, and verbal cues required     HOME EXERCISE PROGRAM: Access Code: [email or web address] URL: [email or web address] Date: 08/31/2021 Prepared by: Scot Jun  Exercises - Supine Shoulder Flexion Extension AAROM with Dowel  - 2-3 x daily - 7 x weekly - 1-2 sets - 10 reps - 5 hold - Supine Shoulder External Rotation with Dowel (Mirrored)  -  2-3 x daily - 7 x weekly - 1-2 sets - 10 reps - 5 hold - Seated Scapular Retraction  - 2-3 x daily - 7 x weekly - 1-2 sets - 10 reps - 5 hold - Supine Shoulder Flexion Extension Full Range AROM  - 1-2 x daily - 7 x weekly - 2-3 sets - 10-15 reps - Sidelying Shoulder Abduction Palm Forward  - 1-2 x daily - 7 x weekly - 2-3 sets - 10-15 reps - Sidelying Shoulder ER with Towel and Dumbbell  - 1-2 x daily - 7 x weekly - 2-3 sets - 10-15 reps   ASSESSMENT:   CLINICAL IMPRESSION: Update of measurements in AROM showed improvement but still limitations in capsular pattern.  Early AROM transition today with good tolerance.  Continued skilled PT services indicated at this time.    OBJECTIVE IMPAIRMENTS decreased  activity tolerance, decreased coordination, decreased endurance, decreased mobility, decreased ROM, decreased strength, hypomobility, increased edema, increased fascial restrictions, impaired perceived functional ability, increased muscle spasms, impaired flexibility, impaired UE functional use, improper body mechanics, postural dysfunction, and pain.    ACTIVITY LIMITATIONS carrying, lifting, sleeping, bathing, toileting, dressing, and reach over head   PARTICIPATION LIMITATIONS: meal prep, cleaning, laundry, interpersonal relationship, driving, community activity, occupation, and yard work   PERSONAL FACTORS No specific factors are affecting patient's functional outcome.    REHAB POTENTIAL: Good   CLINICAL DECISION MAKING: Stable/uncomplicated   EVALUATION COMPLEXITY: Low     GOALS: Goals reviewed with patient? Yes   Short term PT Goals (target date for Short term goals are 3 weeks 09/08/2021) Patient will demonstrate independent use of home exercise program to maintain progress from in clinic treatments. Goal status: MET - 08/31/2021   Long term PT goals (target dates for all long term goals are 10 weeks  10/27/2021 )   1. Patient will demonstrate/report pain at worst less than or equal to 2/10 to facilitate minimal limitation in daily activity secondary to pain symptoms. Goal status: on going - assessed 08/31/2021   2. Patient will demonstrate independent use of home exercise program to facilitate ability to maintain/progress functional gains from skilled physical therapy services. Goal status: on going - assessed 08/31/2021   3. Patient will demonstrate FOTO outcome > or = 65 % to indicate reduced disability due to condition. Goal status: on going - assessed 08/31/2021   4.  Patient will demonstrate Rt [place] joint mobility WFL to facilitate usual self care, dressing, reaching overhead at PLOF s limitation due to symptoms.  Goal status: on going - assessed 08/31/2021   5.  Patient will  demonstrate/report ability to return to work at [employer].    Goal status: on going - assessed 08/31/2021   6.  Patient will demonstrate ability to lift/carry grandkids for normal life activity.   Goal status: on going - assessed 08/31/2021       PLAN: PT FREQUENCY:  2-3x/week to start then 1-2 when appropriate   PT DURATION: 10 weeks   PLANNED INTERVENTIONS: Therapeutic exercises, Therapeutic activity, Neuro Muscular re-education, Balance training, Gait training, Patient/Family education, Joint mobilization, Stair training, DME instructions, Dry Needling, Electrical stimulation, Cryotherapy, Moist heat, Taping, Ultrasound, Ionotophoresis 70m/ml Dexamethasone, and Manual therapy.  All included unless contraindicated   PLAN FOR NEXT SESSION: AROM, early isometrics/light resistance strengthening.    MScot Jun PT, DPT, OCS, ATC 08/31/21  1:43 PM

## 2021-09-02 ENCOUNTER — Ambulatory Visit (INDEPENDENT_AMBULATORY_CARE_PROVIDER_SITE_OTHER): Payer: 59 | Admitting: Rehabilitative and Restorative Service Providers"

## 2021-09-02 ENCOUNTER — Encounter: Payer: Self-pay | Admitting: Rehabilitative and Restorative Service Providers"

## 2021-09-02 DIAGNOSIS — M6281 Muscle weakness (generalized): Secondary | ICD-10-CM | POA: Diagnosis not present

## 2021-09-02 DIAGNOSIS — R600 Localized edema: Secondary | ICD-10-CM

## 2021-09-02 DIAGNOSIS — R293 Abnormal posture: Secondary | ICD-10-CM

## 2021-09-02 DIAGNOSIS — M25511 Pain in right shoulder: Secondary | ICD-10-CM | POA: Diagnosis not present

## 2021-09-02 DIAGNOSIS — G8929 Other chronic pain: Secondary | ICD-10-CM

## 2021-09-02 NOTE — Therapy (Signed)
OUTPATIENT PHYSICAL THERAPY TREATMENT NOTE   Patient Name: Shantika Bermea MRN: [id number] DOB:08-08-1975, 46 y.o., female Today's Date: 09/02/2021   END OF SESSION:   PT End of Session - 09/02/21 1350     Visit Number 4    Number of Visits 20    Date for PT Re-Evaluation 10/27/21    PT Start Time 1350    PT Stop Time 1429    PT Time Calculation (min) 39 min    Activity Tolerance Patient tolerated treatment well    Behavior During Therapy [facility] for tasks assessed/performed               Past Medical History:  Diagnosis Date   Anxiety    HSV-2 (herpes simplex virus 2) infection    Hyperlipidemia    Past Surgical History:  Procedure Laterality Date   CESAREAN SECTION     CHOLECYSTECTOMY     keloid removal     PARTIAL HYSTERECTOMY     TONSILLECTOMY     tummy tuck     Patient Active Problem List   Diagnosis Date Noted   Vitamin D deficiency 04/03/2018   Fibroid 07/02/2013   Menorrhagia 07/02/2013   THERAPY DIAG:  Chronic right shoulder pain  Muscle weakness (generalized)  Localized edema  Abnormal posture  PCP: [name] FNP   REFERRING PROVIDER: Meredith Pel, MD   REFERRING DIAG: [phone] (ICD-10-CM) - Nontraumatic complete tear of right rotator cuff   Rationale for Evaluation and Treatment Rehabilitation   ONSET DATE: Surgery 07/13/2021   SUBJECTIVE:                                                                                                                                                                                       SUBJECTIVE STATEMENT: Pt indicated a little soreness after activity.  No pain to report.    PERTINENT HISTORY: Unremarkable     PAIN:  NPRS scale: no pain, just sore Pain location: Rt shoulder Pain description: heavy, sharp at times, ache at times Aggravating factors: arm movements, lying down Relieving factors: ice, machine for mobility   PRECAUTIONS: Shoulder - RTC repair c biceps tenodesis.  ROM  active/passive for 2 weeks(until 09/01/2021) then ok for strengthening   WEIGHT BEARING RESTRICTIONS No   OBJECTIVE:    PATIENT SURVEYS:   08/18/2021 FOTO intake:   45  predicted:   65  POSTURE: 08/18/2021 Mild rounded shoulders in sitting, easily corrected c verbal cues.    UPPER EXTREMITY ROM:                08/18/2021:  pain end range limited for Rt shoulder .  Seated  Rt shoulder elevation flexion to 65 degrees c shrug noted   AROM (PROM) Right 08/18/2021 Left 08/18/2021 Right 08/24/21 Right AROM in supine  08/31/2021  Shoulder flexion 110 (115) WFL AA: 131 144  Shoulder extension        Shoulder abduction 90(95)    100  Shoulder adduction        Shoulder internal rotation 50 in 30 deg abduction      Shoulder external rotation 20 in 30 deg abduction   AA: 40 43 in 30 deg abduction  Elbow flexion        Elbow extension        Wrist flexion        Wrist extension        Wrist ulnar deviation        Wrist radial deviation        Wrist pronation        Wrist supination        (Blank rows = not tested)   UPPER EXTREMITY MMT:   MMT Right 08/18/2021 Not tested due to surgery protocol c resistance    Shoulder flexion      Shoulder extension      Shoulder abduction      Shoulder adduction      Shoulder internal rotation      Shoulder external rotation      Middle trapezius      Lower trapezius      Elbow flexion      Elbow extension      Wrist flexion      Wrist extension      Wrist ulnar deviation      Wrist radial deviation      Wrist pronation      Wrist supination      Grip strength (lbs)      (Blank rows = not tested)   SHOULDER SPECIAL TESTS: 08/18/2021  : none tested   JOINT MOBILITY TESTING:  08/18/2021 No restriction inferior jt mobs Rt GH joint in mid range   PALPATION:  08/18/2021 Mild tenderness Rt anterior/posterior shoulder generally.              Visual inspection of localized edema Rt shoulder              TODAY'S TREATMENT:   09/02/2021: Manual:  Compression c movement to Rt infraspinatus for trigger point release (noted improvement in flexion mobility post manual intervention)  Therex:  UBE fwd/back 3 mins each way lvl 2.0 Supine Rt shoulder AROM flexion 2 x 15  Sidelying Rt shoulder ER c towel at side 1 lb 2 x 15   Sidelying Rt shoulder abduction 2 x 15 1 lb    Standing green band rows, gh ext x 20 each bilateral  Wall slides flexion x 10     08/31/2021: Manual:  Compression c movement to Rt infraspinatus for trigger point release (noted improvement in flexion mobility post manual intervention)  Therex:  Supine Rt shoulder AROM flexion  x 15  Sidelying Rt shoulder ER 3 x 10  Sidelying Rt shoulder abduction x 15  UBE fwd/back 3 mins each way lvl 2.0    08/24/21 Therex: Scapular retraction x 10 reps AA supine shoulder flexion x 10 reps, 1# bar AA supine shoulder ER x 10 reps, 1# bar Pulleys flexion and scaption x 3 min each Wall walk flexion x 10 reps (no ladder)  Manual: Rt shoulder PROM to tolerance; then Gr 2-3 A/P and inf mobs  PATIENT EDUCATION: 09/02/2021 Education details: HEP progression Person educated: Patient Education method: Consulting civil engineer, Demonstration, Verbal cues, and Handouts Education comprehension: verbalized understanding, returned demonstration, and verbal cues required     HOME EXERCISE PROGRAM: Access Code: [email or web address] URL: [email or web address] Date: 09/02/2021 Prepared by: Scot Jun  Exercises - Supine Shoulder Flexion Extension AAROM with Dowel  - 2-3 x daily - 7 x weekly - 1-2 sets - 10 reps - 5 hold - Supine Shoulder External Rotation with Dowel (Mirrored)  - 2-3 x daily - 7 x weekly - 1-2 sets - 10 reps - 5 hold - Seated Scapular Retraction  - 2-3 x daily - 7 x weekly - 1-2 sets - 10 reps - 5 hold - Supine Shoulder Flexion Extension Full Range AROM  - 1-2 x daily - 7 x weekly - 2-3 sets - 10-15 reps - Sidelying Shoulder Abduction Palm  Forward  - 1-2 x daily - 7 x weekly - 2-3 sets - 10-15 reps - Sidelying Shoulder ER with Towel and Dumbbell  - 1-2 x daily - 7 x weekly - 2-3 sets - 10-15 reps - Standing Bilateral Low Shoulder Row with Anchored Resistance  - 1-2 x daily - 7 x weekly - 1-2 sets - 10-15 reps - Shoulder Extension with Resistance  - 1-2 x daily - 7 x weekly - 1-2 sets - 10-15 reps   ASSESSMENT:   CLINICAL IMPRESSION: Continued progression into early strengthening due to progression of time past referral established limitations.  Fatigue noted but overall good performance and control in movement not against gravity.    OBJECTIVE IMPAIRMENTS decreased activity tolerance, decreased coordination, decreased endurance, decreased mobility, decreased ROM, decreased strength, hypomobility, increased edema, increased fascial restrictions, impaired perceived functional ability, increased muscle spasms, impaired flexibility, impaired UE functional use, improper body mechanics, postural dysfunction, and pain.    ACTIVITY LIMITATIONS carrying, lifting, sleeping, bathing, toileting, dressing, and reach over head   PARTICIPATION LIMITATIONS: meal prep, cleaning, laundry, interpersonal relationship, driving, community activity, occupation, and yard work   PERSONAL FACTORS No specific factors are affecting patient's functional outcome.    REHAB POTENTIAL: Good   CLINICAL DECISION MAKING: Stable/uncomplicated   EVALUATION COMPLEXITY: Low     GOALS: Goals reviewed with patient? Yes   Short term PT Goals (target date for Short term goals are 3 weeks 09/08/2021) Patient will demonstrate independent use of home exercise program to maintain progress from in clinic treatments. Goal status: MET - 08/31/2021   Long term PT goals (target dates for all long term goals are 10 weeks  10/27/2021 )   1. Patient will demonstrate/report pain at worst less than or equal to 2/10 to facilitate minimal limitation in daily activity secondary to  pain symptoms. Goal status: on going - assessed 08/31/2021   2. Patient will demonstrate independent use of home exercise program to facilitate ability to maintain/progress functional gains from skilled physical therapy services. Goal status: on going - assessed 08/31/2021   3. Patient will demonstrate FOTO outcome > or = 65 % to indicate reduced disability due to condition. Goal status: on going - assessed 08/31/2021   4.  Patient will demonstrate Rt [place] joint mobility WFL to facilitate usual self care, dressing, reaching overhead at PLOF s limitation due to symptoms.  Goal status: on going - assessed 08/31/2021   5.  Patient will demonstrate/report ability to return to work at [employer].    Goal status: on going - assessed 08/31/2021   6.  Patient will demonstrate ability to  lift/carry grandkids for normal life activity.   Goal status: on going - assessed 08/31/2021       PLAN: PT FREQUENCY:  2-3x/week to start then 1-2 when appropriate   PT DURATION: 10 weeks   PLANNED INTERVENTIONS: Therapeutic exercises, Therapeutic activity, Neuro Muscular re-education, Balance training, Gait training, Patient/Family education, Joint mobilization, Stair training, DME instructions, Dry Needling, Electrical stimulation, Cryotherapy, Moist heat, Taping, Ultrasound, Ionotophoresis 21m/ml Dexamethasone, and Manual therapy.  All included unless contraindicated   PLAN FOR NEXT SESSION: Assess strength movement against gravity.  Early AROM, high rep strengthening.    MScot Jun PT, DPT, OCS, ATC 09/02/21  2:24 PM

## 2021-09-08 ENCOUNTER — Ambulatory Visit: Payer: 59 | Admitting: Rehabilitative and Restorative Service Providers"

## 2021-09-08 ENCOUNTER — Encounter: Payer: Self-pay | Admitting: Rehabilitative and Restorative Service Providers"

## 2021-09-08 DIAGNOSIS — R600 Localized edema: Secondary | ICD-10-CM

## 2021-09-08 DIAGNOSIS — R293 Abnormal posture: Secondary | ICD-10-CM | POA: Diagnosis not present

## 2021-09-08 DIAGNOSIS — G8929 Other chronic pain: Secondary | ICD-10-CM

## 2021-09-08 DIAGNOSIS — M25511 Pain in right shoulder: Secondary | ICD-10-CM | POA: Diagnosis not present

## 2021-09-08 DIAGNOSIS — M6281 Muscle weakness (generalized): Secondary | ICD-10-CM

## 2021-09-08 NOTE — Therapy (Signed)
OUTPATIENT PHYSICAL THERAPY TREATMENT NOTE   Patient Name: Patricia French MRN: [id number] DOB:Aug 22, 1975, 46 y.o., female Today's Date: 09/08/2021   END OF SESSION:   PT End of Session - 09/08/21 1615     Visit Number 5    Number of Visits 20    Date for PT Re-Evaluation 10/27/21    PT Start Time 6720    PT Stop Time 1632    PT Time Calculation (min) 39 min    Activity Tolerance Patient tolerated treatment well    Behavior During Therapy [facility] for tasks assessed/performed                Past Medical History:  Diagnosis Date   Anxiety    HSV-2 (herpes simplex virus 2) infection    Hyperlipidemia    Past Surgical History:  Procedure Laterality Date   CESAREAN SECTION     CHOLECYSTECTOMY     keloid removal     PARTIAL HYSTERECTOMY     TONSILLECTOMY     tummy tuck     Patient Active Problem List   Diagnosis Date Noted   Vitamin D deficiency 04/03/2018   Fibroid 07/02/2013   Menorrhagia 07/02/2013   THERAPY DIAG:  Chronic right shoulder pain  Muscle weakness (generalized)  Localized edema  Abnormal posture  PCP: [name] FNP   REFERRING PROVIDER: Meredith Pel, MD   REFERRING DIAG: [phone] (ICD-10-CM) - Nontraumatic complete tear of right rotator cuff   Rationale for Evaluation and Treatment Rehabilitation   ONSET DATE: Surgery 07/13/2021   SUBJECTIVE:                                                                                                                                                                                       SUBJECTIVE STATEMENT: Pt indicated she went back to work yesterday and just felt tired from it.  Pt indicated she had no pain at rest upon arrival today.  Pt indicated instances starting Friday of quick pain in shoulder/upper arm that she can feel c rotational movement and various random movements.  Not related to lifting or any exercise at home.  Created sharp pain in moments and some pain after.   Pt indicated  Friday and Saturday were the most noted days.    PERTINENT HISTORY: Unremarkable     PAIN:  NPRS scale: no pain upon arrival. Pain location: Rt shoulder Pain description: heavy, sharp at times, ache at times Aggravating factors: arm movements/rotation of arm Relieving factors: ice, machine for mobility   PRECAUTIONS: Shoulder - RTC repair c biceps tenodesis.  ROM active/passive for 2 weeks(until 09/01/2021) then ok for strengthening   WEIGHT  BEARING RESTRICTIONS No   OBJECTIVE:    PATIENT SURVEYS:   08/18/2021 FOTO intake:   45  predicted:   65  POSTURE: 08/18/2021 Mild rounded shoulders in sitting, easily corrected c verbal cues.    UPPER EXTREMITY ROM:                08/18/2021:  pain end range limited for Rt shoulder .  Seated Rt shoulder elevation flexion to 65 degrees c shrug noted   AROM (PROM) Right 08/18/2021 Left 08/18/2021 Right 08/24/21 Right AROM in supine  08/31/2021  Shoulder flexion 110 (115) WFL AA: 131 144  Shoulder extension        Shoulder abduction 90(95)    100  Shoulder adduction        Shoulder internal rotation 50 in 30 deg abduction      Shoulder external rotation 20 in 30 deg abduction   AA: 40 43 in 30 deg abduction  Elbow flexion        Elbow extension        Wrist flexion        Wrist extension        Wrist ulnar deviation        Wrist radial deviation        Wrist pronation        Wrist supination        (Blank rows = not tested)   UPPER EXTREMITY MMT:   MMT Right 08/18/2021 Not tested due to surgery protocol c resistance  Right 09/08/2021  Shoulder flexion    3+/5  Shoulder extension      Shoulder abduction      Shoulder adduction      Shoulder internal rotation      Shoulder external rotation      Middle trapezius      Lower trapezius      Elbow flexion      Elbow extension      Wrist flexion      Wrist extension      Wrist ulnar deviation      Wrist radial deviation      Wrist pronation      Wrist supination      Grip  strength (lbs)      (Blank rows = not tested)   SHOULDER SPECIAL TESTS: 08/18/2021  : none tested   JOINT MOBILITY TESTING:  08/18/2021 No restriction inferior jt mobs Rt GH joint in mid range   PALPATION:  08/18/2021 Mild tenderness Rt anterior/posterior shoulder generally.              Visual inspection of localized edema Rt shoulder              TODAY'S TREATMENT:  09/08/2021: Manual:  G2-g3 inferior joint mobs, posterior glide c mobilization c movement c Rt shoulder ER   Neuro Re-ed  Supine mild to moderate resistance stabilizations in 90 deg flexion Rt shoulder   Therex:  UBE fwd/back 3 mins each way lvl 2.0 Supine Rt shoulder AROM flexion 1 lb x 15  Sidelying Rt shoulder ER c towel at side 1 lb 2 x 15   Sidelying Rt shoulder abduction 2 x 15 1 lb   Standing ER isometric hold at wall 10 sec hold x 6  09/02/2021: Manual:  Compression c movement to Rt infraspinatus for trigger point release (noted improvement in flexion mobility post manual intervention)  Therex:  UBE fwd/back 3 mins each way lvl 2.0 Supine Rt shoulder AROM flexion  2 x 15  Sidelying Rt shoulder ER c towel at side 1 lb 2 x 15   Sidelying Rt shoulder abduction 2 x 15 1 lb    Standing green band rows, gh ext x 20 each bilateral  Wall slides flexion x 10     08/31/2021: Manual:  Compression c movement to Rt infraspinatus for trigger point release (noted improvement in flexion mobility post manual intervention)  Therex:  Supine Rt shoulder AROM flexion  x 15  Sidelying Rt shoulder ER 3 x 10  Sidelying Rt shoulder abduction x 15  UBE fwd/back 3 mins each way lvl 2.0     PATIENT EDUCATION: 09/02/2021 Education details: HEP progression Person educated: Patient Education method: Consulting civil engineer, Media planner, Verbal cues, and Handouts Education comprehension: verbalized understanding, returned demonstration, and verbal cues required     HOME EXERCISE PROGRAM: Access Code: [email or web address] URL:  [email or web address] Date: 09/02/2021 Prepared by: Scot Jun  Exercises - Supine Shoulder Flexion Extension AAROM with Dowel  - 2-3 x daily - 7 x weekly - 1-2 sets - 10 reps - 5 hold - Supine Shoulder External Rotation with Dowel (Mirrored)  - 2-3 x daily - 7 x weekly - 1-2 sets - 10 reps - 5 hold - Seated Scapular Retraction  - 2-3 x daily - 7 x weekly - 1-2 sets - 10 reps - 5 hold - Supine Shoulder Flexion Extension Full Range AROM  - 1-2 x daily - 7 x weekly - 2-3 sets - 10-15 reps - Sidelying Shoulder Abduction Palm Forward  - 1-2 x daily - 7 x weekly - 2-3 sets - 10-15 reps - Sidelying Shoulder ER with Towel and Dumbbell  - 1-2 x daily - 7 x weekly - 2-3 sets - 10-15 reps - Standing Bilateral Low Shoulder Row with Anchored Resistance  - 1-2 x daily - 7 x weekly - 1-2 sets - 10-15 reps - Shoulder Extension with Resistance  - 1-2 x daily - 7 x weekly - 1-2 sets - 10-15 reps   ASSESSMENT:   CLINICAL IMPRESSION: Recheck of general presentation today showed mild return of end range limitation in flexion and ER but overall close to previous objective mobility checks.  Able to perform 80% elevation against gravity with Rt arm without shrug withou weight.  Review of HEP did not produce any of described complaints.  Overall presentation (minus the symptom report) relatively unchanged today from last with no red flags noted in movement.      OBJECTIVE IMPAIRMENTS decreased activity tolerance, decreased coordination, decreased endurance, decreased mobility, decreased ROM, decreased strength, hypomobility, increased edema, increased fascial restrictions, impaired perceived functional ability, increased muscle spasms, impaired flexibility, impaired UE functional use, improper body mechanics, postural dysfunction, and pain.    ACTIVITY LIMITATIONS carrying, lifting, sleeping, bathing, toileting, dressing, and reach over head   PARTICIPATION LIMITATIONS: meal prep, cleaning, laundry,  interpersonal relationship, driving, community activity, occupation, and yard work   PERSONAL FACTORS No specific factors are affecting patient's functional outcome.    REHAB POTENTIAL: Good   CLINICAL DECISION MAKING: Stable/uncomplicated   EVALUATION COMPLEXITY: Low     GOALS: Goals reviewed with patient? Yes   Short term PT Goals (target date for Short term goals are 3 weeks 09/08/2021) Patient will demonstrate independent use of home exercise program to maintain progress from in clinic treatments. Goal status: MET - 08/31/2021   Long term PT goals (target dates for all long term goals are 10 weeks  10/27/2021 )   1. Patient will  demonstrate/report pain at worst less than or equal to 2/10 to facilitate minimal limitation in daily activity secondary to pain symptoms. Goal status: on going - assessed 08/31/2021   2. Patient will demonstrate independent use of home exercise program to facilitate ability to maintain/progress functional gains from skilled physical therapy services. Goal status: on going - assessed 08/31/2021   3. Patient will demonstrate FOTO outcome > or = 65 % to indicate reduced disability due to condition. Goal status: on going - assessed 08/31/2021   4.  Patient will demonstrate Rt [place] joint mobility WFL to facilitate usual self care, dressing, reaching overhead at PLOF s limitation due to symptoms.  Goal status: on going - assessed 08/31/2021   5.  Patient will demonstrate/report ability to return to work at [employer].    Goal status: on going - assessed 08/31/2021   6.  Patient will demonstrate ability to lift/carry grandkids for normal life activity.   Goal status: on going - assessed 08/31/2021       PLAN: PT FREQUENCY:  2-3x/week to start then 1-2 when appropriate   PT DURATION: 10 weeks   PLANNED INTERVENTIONS: Therapeutic exercises, Therapeutic activity, Neuro Muscular re-education, Balance training, Gait training, Patient/Family education, Joint mobilization,  Stair training, DME instructions, Dry Needling, Electrical stimulation, Cryotherapy, Moist heat, Taping, Ultrasound, Ionotophoresis 33m/ml Dexamethasone, and Manual therapy.  All included unless contraindicated   PLAN FOR NEXT SESSION:  Early AROM, high rep strengthening.    MScot Jun PT, DPT, OCS, ATC 09/08/21  4:29 PM

## 2021-09-10 ENCOUNTER — Ambulatory Visit (INDEPENDENT_AMBULATORY_CARE_PROVIDER_SITE_OTHER): Payer: 59 | Admitting: Rehabilitative and Restorative Service Providers"

## 2021-09-10 ENCOUNTER — Encounter: Payer: Self-pay | Admitting: Rehabilitative and Restorative Service Providers"

## 2021-09-10 DIAGNOSIS — M6281 Muscle weakness (generalized): Secondary | ICD-10-CM | POA: Diagnosis not present

## 2021-09-10 DIAGNOSIS — R293 Abnormal posture: Secondary | ICD-10-CM | POA: Diagnosis not present

## 2021-09-10 DIAGNOSIS — R600 Localized edema: Secondary | ICD-10-CM | POA: Diagnosis not present

## 2021-09-10 DIAGNOSIS — G8929 Other chronic pain: Secondary | ICD-10-CM

## 2021-09-10 DIAGNOSIS — M25511 Pain in right shoulder: Secondary | ICD-10-CM

## 2021-09-10 NOTE — Therapy (Signed)
OUTPATIENT PHYSICAL THERAPY TREATMENT NOTE   Patient Name: Patricia French MRN: [id number] DOB:October 23, 1975, 46 y.o., female Today's Date: 09/10/2021   END OF SESSION:   PT End of Session - 09/10/21 1350     Visit Number 6    Number of Visits 20    Date for PT Re-Evaluation 10/27/21    PT Start Time 0539    PT Stop Time 1425    PT Time Calculation (min) 40 min    Activity Tolerance Patient tolerated treatment well    Behavior During Therapy [facility] for tasks assessed/performed                 Past Medical History:  Diagnosis Date   Anxiety    HSV-2 (herpes simplex virus 2) infection    Hyperlipidemia    Past Surgical History:  Procedure Laterality Date   CESAREAN SECTION     CHOLECYSTECTOMY     keloid removal     PARTIAL HYSTERECTOMY     TONSILLECTOMY     tummy tuck     Patient Active Problem List   Diagnosis Date Noted   Vitamin D deficiency 04/03/2018   Fibroid 07/02/2013   Menorrhagia 07/02/2013   THERAPY DIAG:  Chronic right shoulder pain  Muscle weakness (generalized)  Localized edema  Abnormal posture  PCP: [name] FNP   REFERRING PROVIDER: Meredith Pel, MD   REFERRING DIAG: [phone] (ICD-10-CM) - Nontraumatic complete tear of right rotator cuff   Rationale for Evaluation and Treatment Rehabilitation   ONSET DATE: Surgery 07/13/2021   SUBJECTIVE:                                                                                                                                                                                       SUBJECTIVE STATEMENT: Pt. Indicated feeling soreness today and yesterday but not painful.  Reported no having the sharp pain complaints.     PERTINENT HISTORY: Unremarkable     PAIN:  NPRS scale: no pain upon arrival. Pain location: Rt shoulder Pain description: sore Aggravating factors: general soreness Relieving factors: ice   PRECAUTIONS: Shoulder - RTC repair c biceps tenodesis.  ROM active/passive  for 2 weeks(until 09/01/2021) then ok for strengthening   WEIGHT BEARING RESTRICTIONS No   OBJECTIVE:    PATIENT SURVEYS:  09/10/2021 : FOTO update:    08/18/2021 FOTO intake:   45  predicted:   65  POSTURE: 08/18/2021 Mild rounded shoulders in sitting, easily corrected c verbal cues.    UPPER EXTREMITY ROM:                08/18/2021:  pain end range limited for Rt  shoulder .  Seated Rt shoulder elevation flexion to 65 degrees c shrug noted   AROM (PROM) Right 08/18/2021 Left 08/18/2021 Right 08/24/21 Right AROM in supine  08/31/2021  Shoulder flexion 110 (115) WFL AA: 131 144  Shoulder extension        Shoulder abduction 90(95)    100  Shoulder adduction        Shoulder internal rotation 50 in 30 deg abduction      Shoulder external rotation 20 in 30 deg abduction   AA: 40 43 in 30 deg abduction  Elbow flexion        Elbow extension        Wrist flexion        Wrist extension        Wrist ulnar deviation        Wrist radial deviation        Wrist pronation        Wrist supination        (Blank rows = not tested)   UPPER EXTREMITY MMT:   MMT Right 08/18/2021 Not tested due to surgery protocol c resistance  Right 09/08/2021  Shoulder flexion    3+/5  Shoulder extension      Shoulder abduction      Shoulder adduction      Shoulder internal rotation      Shoulder external rotation      Middle trapezius      Lower trapezius      Elbow flexion      Elbow extension      Wrist flexion      Wrist extension      Wrist ulnar deviation      Wrist radial deviation      Wrist pronation      Wrist supination      Grip strength (lbs)      (Blank rows = not tested)   SHOULDER SPECIAL TESTS: 08/18/2021  : none tested   JOINT MOBILITY TESTING:  08/18/2021 No restriction inferior jt mobs Rt GH joint in mid range   PALPATION:  08/18/2021 Mild tenderness Rt anterior/posterior shoulder generally.              Visual inspection of localized edema Rt shoulder              TODAY'S  TREATMENT:   09/10/2021: Manual:  Compression c active isometric PNF hold Rt lat for elevation improvements.   Neuro Re-ed  Supine mild to moderate resistance stabilizations in 90 deg flexion Rt shoulder   Therex:  UBE fwd/back 3 mins each way lvl 2.0 Supine Rt shoulder AROM flexion 1 lb x 15  Sidelying Rt shoulder ER c towel at side 1 lb 2 x 15   Sidelying Rt shoulder abduction 2 x 15 1 lb   Standing wall slide flexion review c education on contract/relax techniques for Rt lat mobility.   09/08/2021: Manual:  G2-g3 inferior joint mobs, posterior glide c mobilization c movement c Rt shoulder ER   Neuro Re-ed  Supine mild to moderate resistance stabilizations in 90 deg flexion Rt shoulder   Therex:  UBE fwd/back 3 mins each way lvl 2.0 Supine Rt shoulder AROM flexion 1 lb x 15  Sidelying Rt shoulder ER c towel at side 1 lb 2 x 15   Sidelying Rt shoulder abduction 2 x 15 1 lb   Standing ER isometric hold at wall 10 sec hold x 6  09/02/2021: Manual:  Compression c movement to  Rt infraspinatus for trigger point release (noted improvement in flexion mobility post manual intervention)  Therex:  UBE fwd/back 3 mins each way lvl 2.0 Supine Rt shoulder AROM flexion 2 x 15  Sidelying Rt shoulder ER c towel at side 1 lb 2 x 15   Sidelying Rt shoulder abduction 2 x 15 1 lb    Standing green band rows, gh ext x 20 each bilateral  Wall slides flexion x 10     PATIENT EDUCATION: 09/02/2021 Education details: HEP progression Person educated: Patient Education method: Consulting civil engineer, Media planner, Verbal cues, and Handouts Education comprehension: verbalized understanding, returned demonstration, and verbal cues required     HOME EXERCISE PROGRAM: Access Code: [email or web address] URL: [email or web address] Date: 09/02/2021 Prepared by: Scot Jun  Exercises - Supine Shoulder Flexion Extension AAROM with Dowel  - 2-3 x daily - 7 x weekly - 1-2 sets - 10 reps - 5 hold -  Supine Shoulder External Rotation with Dowel (Mirrored)  - 2-3 x daily - 7 x weekly - 1-2 sets - 10 reps - 5 hold - Seated Scapular Retraction  - 2-3 x daily - 7 x weekly - 1-2 sets - 10 reps - 5 hold - Supine Shoulder Flexion Extension Full Range AROM  - 1-2 x daily - 7 x weekly - 2-3 sets - 10-15 reps - Sidelying Shoulder Abduction Palm Forward  - 1-2 x daily - 7 x weekly - 2-3 sets - 10-15 reps - Sidelying Shoulder ER with Towel and Dumbbell  - 1-2 x daily - 7 x weekly - 2-3 sets - 10-15 reps - Standing Bilateral Low Shoulder Row with Anchored Resistance  - 1-2 x daily - 7 x weekly - 1-2 sets - 10-15 reps - Shoulder Extension with Resistance  - 1-2 x daily - 7 x weekly - 1-2 sets - 10-15 reps   ASSESSMENT:   CLINICAL IMPRESSION: No incidences of previous sharp pain reported.   Presence of soreness prompted keeping strengthening intervention consistent without changes of increased difficulty to allow time for tissue adaptation.  Lat mobility and trigger points on Rt side impacted elevation.  Improvement in movement noted after manual c reduced symptoms in end range.    OBJECTIVE IMPAIRMENTS decreased activity tolerance, decreased coordination, decreased endurance, decreased mobility, decreased ROM, decreased strength, hypomobility, increased edema, increased fascial restrictions, impaired perceived functional ability, increased muscle spasms, impaired flexibility, impaired UE functional use, improper body mechanics, postural dysfunction, and pain.    ACTIVITY LIMITATIONS carrying, lifting, sleeping, bathing, toileting, dressing, and reach over head   PARTICIPATION LIMITATIONS: meal prep, cleaning, laundry, interpersonal relationship, driving, community activity, occupation, and yard work   PERSONAL FACTORS No specific factors are affecting patient's functional outcome.    REHAB POTENTIAL: Good   CLINICAL DECISION MAKING: Stable/uncomplicated   EVALUATION COMPLEXITY: Low      GOALS: Goals reviewed with patient? Yes   Short term PT Goals (target date for Short term goals are 3 weeks 09/08/2021) Patient will demonstrate independent use of home exercise program to maintain progress from in clinic treatments. Goal status: MET - 08/31/2021   Long term PT goals (target dates for all long term goals are 10 weeks  10/27/2021 )   1. Patient will demonstrate/report pain at worst less than or equal to 2/10 to facilitate minimal limitation in daily activity secondary to pain symptoms. Goal status: on going - assessed 08/31/2021   2. Patient will demonstrate independent use of home exercise program to facilitate ability to maintain/progress functional gains from skilled  physical therapy services. Goal status: on going - assessed 08/31/2021   3. Patient will demonstrate FOTO outcome > or = 65 % to indicate reduced disability due to condition. Goal status: on going - assessed 08/31/2021   4.  Patient will demonstrate Rt [place] joint mobility WFL to facilitate usual self care, dressing, reaching overhead at PLOF s limitation due to symptoms.  Goal status: on going - assessed 08/31/2021   5.  Patient will demonstrate/report ability to return to work at [employer].    Goal status: on going - assessed 08/31/2021   6.  Patient will demonstrate ability to lift/carry grandkids for normal life activity.   Goal status: on going - assessed 08/31/2021       PLAN: PT FREQUENCY:  2-3x/week to start then 1-2 when appropriate   PT DURATION: 10 weeks   PLANNED INTERVENTIONS: Therapeutic exercises, Therapeutic activity, Neuro Muscular re-education, Balance training, Gait training, Patient/Family education, Joint mobilization, Stair training, DME instructions, Dry Needling, Electrical stimulation, Cryotherapy, Moist heat, Taping, Ultrasound, Ionotophoresis 57m/ml Dexamethasone, and Manual therapy.  All included unless contraindicated   PLAN FOR NEXT SESSION:  Early AROM, high rep strengthening.     MScot Jun PT, DPT, OCS, ATC 09/10/21  2:27 PM

## 2021-09-14 ENCOUNTER — Encounter: Payer: Self-pay | Admitting: Rehabilitative and Restorative Service Providers"

## 2021-09-14 ENCOUNTER — Ambulatory Visit (INDEPENDENT_AMBULATORY_CARE_PROVIDER_SITE_OTHER): Payer: 59 | Admitting: Orthopedic Surgery

## 2021-09-14 ENCOUNTER — Ambulatory Visit: Payer: 59 | Admitting: Rehabilitative and Restorative Service Providers"

## 2021-09-14 DIAGNOSIS — M25511 Pain in right shoulder: Secondary | ICD-10-CM

## 2021-09-14 DIAGNOSIS — R600 Localized edema: Secondary | ICD-10-CM | POA: Diagnosis not present

## 2021-09-14 DIAGNOSIS — M6281 Muscle weakness (generalized): Secondary | ICD-10-CM

## 2021-09-14 DIAGNOSIS — R293 Abnormal posture: Secondary | ICD-10-CM

## 2021-09-14 DIAGNOSIS — M75121 Complete rotator cuff tear or rupture of right shoulder, not specified as traumatic: Secondary | ICD-10-CM

## 2021-09-14 DIAGNOSIS — G8929 Other chronic pain: Secondary | ICD-10-CM

## 2021-09-14 NOTE — Therapy (Signed)
OUTPATIENT PHYSICAL THERAPY TREATMENT NOTE   Patient Name: Patricia French MRN: [id number] DOB:04-12-1975, 46 y.o., female Today's Date: 09/14/2021   END OF SESSION:   PT End of Session - 09/14/21 1304     Visit Number 7    Number of Visits 20    Date for PT Re-Evaluation 10/27/21    PT Start Time 1301    PT Stop Time 1340    PT Time Calculation (min) 39 min    Activity Tolerance Patient tolerated treatment well    Behavior During Therapy [facility] for tasks assessed/performed                  Past Medical History:  Diagnosis Date   Anxiety    HSV-2 (herpes simplex virus 2) infection    Hyperlipidemia    Past Surgical History:  Procedure Laterality Date   CESAREAN SECTION     CHOLECYSTECTOMY     keloid removal     PARTIAL HYSTERECTOMY     TONSILLECTOMY     tummy tuck     Patient Active Problem List   Diagnosis Date Noted   Vitamin D deficiency 04/03/2018   Fibroid 07/02/2013   Menorrhagia 07/02/2013   THERAPY DIAG:  Chronic right shoulder pain  Muscle weakness (generalized)  Localized edema  Abnormal posture  PCP: [name] FNP   REFERRING PROVIDER: Meredith Pel, MD   REFERRING DIAG: [phone] (ICD-10-CM) - Nontraumatic complete tear of right rotator cuff   Rationale for Evaluation and Treatment Rehabilitation   ONSET DATE: Surgery 07/13/2021   SUBJECTIVE:                                                                                                                                                                                       SUBJECTIVE STATEMENT: Pt indicated soreness throughout the day "from using arm more."   Pt indicated using arm more.  Pt indicated its still hard to get arm way high without helping.  No instances of the pain from last week reported today.    PERTINENT HISTORY: Unremarkable     PAIN:  NPRS scale: no pain upon arrival. Pain location: Rt shoulder Pain description: sore Aggravating factors: general  soreness Relieving factors: ice   PRECAUTIONS: Shoulder - RTC repair c biceps tenodesis.  ROM active/passive for 2 weeks(until 09/01/2021) then ok for strengthening   WEIGHT BEARING RESTRICTIONS No   OBJECTIVE:    PATIENT SURVEYS:  09/14/2021 : FOTO update:  60  08/18/2021 FOTO intake:   45  predicted:   65  POSTURE: 08/18/2021 Mild rounded shoulders in sitting, easily corrected c verbal cues.    UPPER EXTREMITY ROM:  08/18/2021:  pain end range limited for Rt shoulder .  Seated Rt shoulder elevation flexion to 65 degrees c shrug noted   AROM (PROM) Right 08/18/2021 Left 08/18/2021 Right 08/24/21 Right AROM in supine  08/31/2021 Right AROM  Shoulder flexion 110 (115) WFL AA: 131 144 140 against gravity in sitting  Shoulder extension         Shoulder abduction 90(95)    100   Shoulder adduction         Shoulder internal rotation 50 in 30 deg abduction       Shoulder external rotation 20 in 30 deg abduction   AA: 40 43 in 30 deg abduction   Elbow flexion         Elbow extension         Wrist flexion         Wrist extension         Wrist ulnar deviation         Wrist radial deviation         Wrist pronation         Wrist supination         (Blank rows = not tested)   UPPER EXTREMITY MMT:   MMT Right 08/18/2021 Not tested due to surgery protocol c resistance  Right 09/08/2021 Right 09/14/2021  Shoulder flexion    3+/5 4+/5  Shoulder extension       Shoulder abduction     4/5  Shoulder adduction       Shoulder internal rotation     5/5  Shoulder external rotation     4/5  Middle trapezius       Lower trapezius       Elbow flexion       Elbow extension       Wrist flexion       Wrist extension       Wrist ulnar deviation       Wrist radial deviation       Wrist pronation       Wrist supination       Grip strength (lbs)       (Blank rows = not tested)   SHOULDER SPECIAL TESTS: 08/18/2021  : none tested   JOINT MOBILITY TESTING:  08/18/2021 No  restriction inferior jt mobs Rt GH joint in mid range   PALPATION:  08/18/2021 Mild tenderness Rt anterior/posterior shoulder generally.              Visual inspection of localized edema Rt shoulder              TODAY'S TREATMENT:  09/14/2021: Therex:  Pulleys flexion, scaption 2 mins each way   Supine horizontal abduction green band 2 x 10  Supine Rt shoulder 90 deg flexion circles cw, ccw  30 x 2 bilateral  3 lbs  Supine Rt shoulder 90 deg flexion SA hold 3 lbs 5 sec 2 x 10    Standing blue band rows, gh ext 2 x 15 each  Standing blue band reactive ER hold walk outs 5 sec hold x 15 c towel at side  Standing shoulder flexion to 90 deg c eccentric lowering slow focus x 15 - 1 lb   Wall push up 2 x 10 c SA press away at top of movement.    09/10/2021: Manual:  Compression c active isometric PNF hold Rt lat for elevation improvements.   Neuro Re-ed  Supine mild to moderate resistance stabilizations in 90 deg flexion  Rt shoulder   Therex:  UBE fwd/back 3 mins each way lvl 2.0 Supine Rt shoulder AROM flexion 1 lb x 15  Sidelying Rt shoulder ER c towel at side 1 lb 2 x 15   Sidelying Rt shoulder abduction 2 x 15 1 lb   Standing wall slide flexion review c education on contract/relax techniques for Rt lat mobility.   09/08/2021: Manual:  G2-g3 inferior joint mobs, posterior glide c mobilization c movement c Rt shoulder ER   Neuro Re-ed  Supine mild to moderate resistance stabilizations in 90 deg flexion Rt shoulder   Therex:  UBE fwd/back 3 mins each way lvl 2.0 Supine Rt shoulder AROM flexion 1 lb x 15  Sidelying Rt shoulder ER c towel at side 1 lb 2 x 15   Sidelying Rt shoulder abduction 2 x 15 1 lb   Standing ER isometric hold at wall 10 sec hold x 6   PATIENT EDUCATION: 09/02/2021 Education details: HEP progression Person educated: Patient Education method: Consulting civil engineer, Media planner, Verbal cues, and Handouts Education comprehension: verbalized understanding, returned  demonstration, and verbal cues required     HOME EXERCISE PROGRAM: Access Code: [email or web address] URL: [email or web address] Date: 09/02/2021 Prepared by: Scot Jun  Exercises - Supine Shoulder Flexion Extension AAROM with Dowel  - 2-3 x daily - 7 x weekly - 1-2 sets - 10 reps - 5 hold - Supine Shoulder External Rotation with Dowel (Mirrored)  - 2-3 x daily - 7 x weekly - 1-2 sets - 10 reps - 5 hold - Seated Scapular Retraction  - 2-3 x daily - 7 x weekly - 1-2 sets - 10 reps - 5 hold - Supine Shoulder Flexion Extension Full Range AROM  - 1-2 x daily - 7 x weekly - 2-3 sets - 10-15 reps - Sidelying Shoulder Abduction Palm Forward  - 1-2 x daily - 7 x weekly - 2-3 sets - 10-15 reps - Sidelying Shoulder ER with Towel and Dumbbell  - 1-2 x daily - 7 x weekly - 2-3 sets - 10-15 reps - Standing Bilateral Low Shoulder Row with Anchored Resistance  - 1-2 x daily - 7 x weekly - 1-2 sets - 10-15 reps - Shoulder Extension with Resistance  - 1-2 x daily - 7 x weekly - 1-2 sets - 10-15 reps   ASSESSMENT:   CLINICAL IMPRESSION: Continued steady improvement in active range, specifically against gravity (approx. 85% range at this time).  Continued progressive strengthening to help improve progressive lifting at work.   Noted FOTO improvement upon reassessment today.    OBJECTIVE IMPAIRMENTS decreased activity tolerance, decreased coordination, decreased endurance, decreased mobility, decreased ROM, decreased strength, hypomobility, increased edema, increased fascial restrictions, impaired perceived functional ability, increased muscle spasms, impaired flexibility, impaired UE functional use, improper body mechanics, postural dysfunction, and pain.    ACTIVITY LIMITATIONS carrying, lifting, sleeping, bathing, toileting, dressing, and reach over head   PARTICIPATION LIMITATIONS: meal prep, cleaning, laundry, interpersonal relationship, driving, community activity, occupation, and yard work    PERSONAL FACTORS No specific factors are affecting patient's functional outcome.    REHAB POTENTIAL: Good   CLINICAL DECISION MAKING: Stable/uncomplicated   EVALUATION COMPLEXITY: Low     GOALS: Goals reviewed with patient? Yes   Short term PT Goals (target date for Short term goals are 3 weeks 09/08/2021) Patient will demonstrate independent use of home exercise program to maintain progress from in clinic treatments. Goal status: MET - 08/31/2021   Long term PT goals (target dates for all long  term goals are 10 weeks  10/27/2021 )   1. Patient will demonstrate/report pain at worst less than or equal to 2/10 to facilitate minimal limitation in daily activity secondary to pain symptoms. Goal status: on going - assessed 08/31/2021   2. Patient will demonstrate independent use of home exercise program to facilitate ability to maintain/progress functional gains from skilled physical therapy services. Goal status: on going - assessed 08/31/2021   3. Patient will demonstrate FOTO outcome > or = 65 % to indicate reduced disability due to condition. Goal status: on going - assessed 08/31/2021   4.  Patient will demonstrate Rt [place] joint mobility WFL to facilitate usual self care, dressing, reaching overhead at PLOF s limitation due to symptoms.  Goal status: on going - assessed 08/31/2021   5.  Patient will demonstrate/report ability to return to work at [employer].    Goal status: on going - assessed 08/31/2021   6.  Patient will demonstrate ability to lift/carry grandkids for normal life activity.   Goal status: on going - assessed 08/31/2021       PLAN: PT FREQUENCY:  2-3x/week to start then 1-2 when appropriate   PT DURATION: 10 weeks   PLANNED INTERVENTIONS: Therapeutic exercises, Therapeutic activity, Neuro Muscular re-education, Balance training, Gait training, Patient/Family education, Joint mobilization, Stair training, DME instructions, Dry Needling, Electrical stimulation, Cryotherapy,  Moist heat, Taping, Ultrasound, Ionotophoresis 57m/ml Dexamethasone, and Manual therapy.  All included unless contraindicated   PLAN FOR NEXT SESSION:  Progressive strengthening as tolerated for functional reach/lifting improvements.    MScot Jun PT, DPT, OCS, ATC 09/14/21  1:42 PM

## 2021-09-20 ENCOUNTER — Encounter: Payer: Self-pay | Admitting: Orthopedic Surgery

## 2021-09-20 NOTE — Progress Notes (Signed)
   Post-Op Visit Note   Patient: Patricia French           Date of Birth: 1975-03-04           MRN: [id number] Visit Date: 09/14/2021 PCP: [name], FNP   Assessment & Plan:  Chief Complaint:  Chief Complaint  Patient presents with   Right Shoulder - Follow-up    right shoulder rotator cuff repair and biceps tenodesis on 07/13/2021   Visit Diagnoses:  1. Nontraumatic complete tear of right rotator cuff     Plan: Patient presents for follow-up of right shoulder rotator cuff tear repair and biceps tenodesis performed 07/13/2021.  Still doing therapy 2 times a week.  No medication for pain.  Just started strengthening with 3 pound weights.  Sleeping okay but not on the right side.  On examination she has excellent range of motion and good strength with smooth range of motion at 90 degrees of abduction with internal and external rotation.  Like for her to continue therapy 1-2 times a week for 6 more weeks.  6-week return for final check and release.  Follow-Up Instructions: No follow-ups on file.   Orders:  No orders of the defined types were placed in this encounter.  No orders of the defined types were placed in this encounter.   Imaging: No results found.  PMFS History: Patient Active Problem List   Diagnosis Date Noted   Vitamin D deficiency 04/03/2018   Fibroid 07/02/2013   Menorrhagia 07/02/2013   Past Medical History:  Diagnosis Date   Anxiety    HSV-2 (herpes simplex virus 2) infection    Hyperlipidemia     Family History  Problem Relation Age of Onset   Diabetes Mother    Hypertension Mother    Hyperlipidemia Mother    Heart disease Mother    Varicose Veins Mother    Anxiety disorder Mother    Heart attack Father    Bipolar disorder Father    Schizophrenia Father    Hyperlipidemia Father    Alzheimer's disease Father    Cancer Maternal Grandmother    Migraines Maternal Grandmother    Hypertension Maternal Grandmother    Alzheimer's disease Paternal  Grandmother    Leukemia Paternal Grandfather    Colon cancer Neg Hx    Colon polyps Neg Hx    Esophageal cancer Neg Hx    Rectal cancer Neg Hx    Stomach cancer Neg Hx     Past Surgical History:  Procedure Laterality Date   CESAREAN SECTION     CHOLECYSTECTOMY     keloid removal     PARTIAL HYSTERECTOMY     TONSILLECTOMY     tummy tuck     Social History   Occupational History   Not on file  Tobacco Use   Smoking status: Never   Smokeless tobacco: Never  Vaping Use   Vaping Use: Never used  Substance and Sexual Activity   Alcohol use: Yes    Comment: occas.   Drug use: No   Sexual activity: Not on file

## 2021-09-22 ENCOUNTER — Encounter: Payer: 59 | Admitting: Rehabilitative and Restorative Service Providers"

## 2021-09-24 ENCOUNTER — Encounter: Payer: 59 | Admitting: Rehabilitative and Restorative Service Providers"

## 2021-09-29 ENCOUNTER — Encounter: Payer: 59 | Admitting: Rehabilitative and Restorative Service Providers"

## 2021-10-01 ENCOUNTER — Encounter: Payer: 59 | Admitting: Rehabilitative and Restorative Service Providers"

## 2021-10-06 ENCOUNTER — Encounter: Payer: 59 | Admitting: Rehabilitative and Restorative Service Providers"

## 2021-10-08 ENCOUNTER — Encounter: Payer: Self-pay | Admitting: Rehabilitative and Restorative Service Providers"

## 2021-10-08 ENCOUNTER — Ambulatory Visit (INDEPENDENT_AMBULATORY_CARE_PROVIDER_SITE_OTHER): Payer: 59 | Admitting: Rehabilitative and Restorative Service Providers"

## 2021-10-08 DIAGNOSIS — M25511 Pain in right shoulder: Secondary | ICD-10-CM

## 2021-10-08 DIAGNOSIS — M6281 Muscle weakness (generalized): Secondary | ICD-10-CM

## 2021-10-08 DIAGNOSIS — R600 Localized edema: Secondary | ICD-10-CM

## 2021-10-08 DIAGNOSIS — R293 Abnormal posture: Secondary | ICD-10-CM | POA: Diagnosis not present

## 2021-10-08 DIAGNOSIS — G8929 Other chronic pain: Secondary | ICD-10-CM

## 2021-10-08 NOTE — Therapy (Signed)
OUTPATIENT PHYSICAL THERAPY TREATMENT NOTE   Patient Name: Patricia French MRN: [id number] DOB:May 13, 1975, 46 y.o., female Today's Date: 10/08/2021   END OF SESSION:   PT End of Session - 10/08/21 1430     Visit Number 8    Number of Visits 20    Date for PT Re-Evaluation 10/27/21    PT Start Time 3734    Activity Tolerance Patient tolerated treatment well    Behavior During Therapy [facility] for tasks assessed/performed                   Past Medical History:  Diagnosis Date   Anxiety    HSV-2 (herpes simplex virus 2) infection    Hyperlipidemia    Past Surgical History:  Procedure Laterality Date   CESAREAN SECTION     CHOLECYSTECTOMY     keloid removal     PARTIAL HYSTERECTOMY     TONSILLECTOMY     tummy tuck     Patient Active Problem List   Diagnosis Date Noted   Vitamin D deficiency 04/03/2018   Fibroid 07/02/2013   Menorrhagia 07/02/2013   THERAPY DIAG:  No diagnosis found.  PCP: [name] FNP   REFERRING PROVIDER: Meredith Pel, MD   REFERRING DIAG: [phone] (ICD-10-CM) - Nontraumatic complete tear of right rotator cuff   Rationale for Evaluation and Treatment Rehabilitation   ONSET DATE: Surgery 07/13/2021   SUBJECTIVE:                                                                                                                                                                                       SUBJECTIVE STATEMENT: She had to cancel a few visits due to family illness and transportation concerns. Shoulder is doing fine. She has some twinges of pain at the incision and it is sensitive to touch, so she massages it several times per day and monitors for keloid scarring. Her remaining reported limitations include comfortably putting her hand on her hip and reaching behind her back without pain.   PERTINENT HISTORY: Unremarkable     PAIN:  NPRS scale: no pain upon arrival. Pain location: Rt shoulder Pain description:  sore Aggravating factors: general soreness Relieving factors: ice   PRECAUTIONS: Shoulder - RTC repair c biceps tenodesis.  ROM active/passive for 2 weeks(until 09/01/2021) then ok for strengthening   WEIGHT BEARING RESTRICTIONS No   OBJECTIVE:    PATIENT SURVEYS:  09/14/2021 : FOTO update:  60  08/18/2021 FOTO intake:   45  predicted:   65  POSTURE: 08/18/2021 Mild rounded shoulders in sitting, easily corrected c verbal cues.    UPPER EXTREMITY ROM:  08/18/2021:  pain end range limited for Rt shoulder .  Seated Rt shoulder elevation flexion to 65 degrees c shrug noted   AROM (PROM) Right 08/18/2021 Left 08/18/2021 Right 08/24/21 Right AROM in supine  08/31/2021 Right  AROM 10/08/2021  Shoulder flexion 110 (115) WFL AA: 131 144 136 against gravity in sitting  Shoulder extension         Shoulder abduction 90(95)    100 113 in supine  Shoulder adduction         Shoulder internal rotation 50 in 30 deg abduction     70 AROM in 45 deg abduction  Seated functional IR to contralateral PSIS and painful  Shoulder external rotation 20 in 30 deg abduction   AA: 40 43 in 30 deg abduction 45 AROM in 45 deg abduction  Elbow flexion         Elbow extension         Wrist flexion         Wrist extension         Wrist ulnar deviation         Wrist radial deviation         Wrist pronation         Wrist supination         (Blank rows = not tested)   UPPER EXTREMITY MMT:   MMT Right 08/18/2021 Not tested due to surgery protocol c resistance  Right 09/08/2021 Right 09/14/2021 Right 10/08/2021  Shoulder flexion    3+/5 4+/5 5/5  Shoulder extension        Shoulder abduction     4/5 4/5  Shoulder adduction        Shoulder internal rotation     5/5 5/5  Shoulder external rotation     4/5 4/5  Middle trapezius        Lower trapezius        Elbow flexion        Elbow extension        Wrist flexion        Wrist extension        Wrist ulnar deviation        Wrist radial deviation         Wrist pronation        Wrist supination        Grip strength (lbs)        (Blank rows = not tested)   SHOULDER SPECIAL TESTS: 08/18/2021  : none tested   JOINT MOBILITY TESTING:  08/18/2021 No restriction inferior jt mobs Rt GH joint in mid range   PALPATION:  08/18/2021 Mild tenderness Rt anterior/posterior shoulder generally.              Visual inspection of localized edema Rt shoulder              TODAY'S TREATMENT:  10/08/2021: TherEx: Pulleys flexion, scaption 2 mins each way  Seated posterior capsule stretch 1 x 30s Supine sleeper stretch 2 x 30s Standing doorway ER stretch 1 x 30s Green band ER 2 x 10 Standing flexion and scaption AROM PT printed new HEP handout, pt verbalized understanding  Self Care:  PT demo and explain scare mobilization techniques, pt verbalized understanding  09/14/2021: Therex:  Pulleys flexion, scaption 2 mins each way   Supine horizontal abduction green band 2 x 10  Supine Rt shoulder 90 deg flexion circles cw, ccw  30 x 2 bilateral  3 lbs  Supine Rt shoulder 90 deg  flexion SA hold 3 lbs 5 sec 2 x 10    Standing blue band rows, gh ext 2 x 15 each  Standing blue band reactive ER hold walk outs 5 sec hold x 15 c towel at side  Standing shoulder flexion to 90 deg c eccentric lowering slow focus x 15 - 1 lb   Wall push up 2 x 10 c SA press away at top of movement.    09/10/2021: Manual:  Compression c active isometric PNF hold Rt lat for elevation improvements.   Neuro Re-ed  Supine mild to moderate resistance stabilizations in 90 deg flexion Rt shoulder   Therex:  UBE fwd/back 3 mins each way lvl 2.0 Supine Rt shoulder AROM flexion 1 lb x 15  Sidelying Rt shoulder ER c towel at side 1 lb 2 x 15   Sidelying Rt shoulder abduction 2 x 15 1 lb   Standing wall slide flexion review c education on contract/relax techniques for Rt lat mobility.   PATIENT EDUCATION: 09/02/2021 Education details: HEP progression Person educated:  Patient Education method: Consulting civil engineer, Demonstration, Verbal cues, and Handouts Education comprehension: verbalized understanding, returned demonstration, and verbal cues required     HOME EXERCISE PROGRAM: Access Code: [email or web address] URL: [email or web address] Date: 10/08/2021 Prepared by: Scot Jun  Exercises - Standing Bilateral Low Shoulder Row with Anchored Resistance  - 1-2 x daily - 7 x weekly - 1-2 sets - 10-15 reps - Shoulder Extension with Resistance  - 1-2 x daily - 7 x weekly - 1-2 sets - 10-15 reps - Standing Shoulder Posterior Capsule Stretch (Mirrored)  - 2 x daily - 7 x weekly - 1 sets - 5 reps - 15-30 hold - Sleeper Stretch (Mirrored)  - 2 x daily - 7 x weekly - 1 sets - 5 reps - 15-30 hold - Standing Shoulder Flexion to 90 Degrees with Dumbbells  - 1-2 x daily - 7 x weekly - 1-2 sets - 10 reps - Shoulder Abduction with Dumbbells - Thumbs Up  - 1-2 x daily - 7 x weekly - 1-2 sets - 10 reps - Single Arm Doorway Pec Stretch at 90 Degrees Abduction  - 2 x daily - 7 x weekly - 1 sets - 5 reps - 30 hold - Shoulder External Rotation with Anchored Resistance  - 2 x daily - 7 x weekly - 3 sets - 10 reps   ASSESSMENT:   CLINICAL IMPRESSION: She presented today with increased active range of motion and increased shoulder flexion strength. HEP was progressed with further stretching and strengthening interventions to match improved presentation. Her main reported limitation is reaching behind her back. She continues to benefit from skilled PT to address functional deficits.   OBJECTIVE IMPAIRMENTS decreased activity tolerance, decreased coordination, decreased endurance, decreased mobility, decreased ROM, decreased strength, hypomobility, increased edema, increased fascial restrictions, impaired perceived functional ability, increased muscle spasms, impaired flexibility, impaired UE functional use, improper body mechanics, postural dysfunction, and pain.    ACTIVITY  LIMITATIONS carrying, lifting, sleeping, bathing, toileting, dressing, and reach over head   PARTICIPATION LIMITATIONS: meal prep, cleaning, laundry, interpersonal relationship, driving, community activity, occupation, and yard work   PERSONAL FACTORS No specific factors are affecting patient's functional outcome.    REHAB POTENTIAL: Good   CLINICAL DECISION MAKING: Stable/uncomplicated   EVALUATION COMPLEXITY: Low     GOALS: Goals reviewed with patient? Yes   Short term PT Goals (target date for Short term goals are 3 weeks 09/08/2021) Patient will demonstrate independent use of  home exercise program to maintain progress from in clinic treatments. Goal status: MET - 08/31/2021   Long term PT goals (target dates for all long term goals are 10 weeks  10/27/2021 )   1. Patient will demonstrate/report pain at worst less than or equal to 2/10 to facilitate minimal limitation in daily activity secondary to pain symptoms. Goal status: on going - assessed 10/08/2021   2. Patient will demonstrate independent use of home exercise program to facilitate ability to maintain/progress functional gains from skilled physical therapy services. Goal status: on going - assessed 10/08/2021   3. Patient will demonstrate FOTO outcome > or = 65 % to indicate reduced disability due to condition. Goal status: on going - assessed 08/31/2021   4.  Patient will demonstrate Rt [place] joint mobility WFL to facilitate usual self care, dressing, reaching overhead at PLOF s limitation due to symptoms.  Goal status: on going - assessed 10/08/2021   5.  Patient will demonstrate/report ability to return to work at [employer].    Goal status: on going - assessed 08/31/2021   6.  Patient will demonstrate ability to lift/carry grandkids for normal life activity.   Goal status: on going - assessed 08/31/2021       PLAN: PT FREQUENCY:  2-3x/week to start then 1-2 when appropriate   PT DURATION: 10 weeks   PLANNED INTERVENTIONS:  Therapeutic exercises, Therapeutic activity, Neuro Muscular re-education, Balance training, Gait training, Patient/Family education, Joint mobilization, Stair training, DME instructions, Dry Needling, Electrical stimulation, Cryotherapy, Moist heat, Taping, Ultrasound, Ionotophoresis 51m/ml Dexamethasone, and Manual therapy.  All included unless contraindicated   PLAN FOR NEXT SESSION: review new HEP as indicated, continue progressive strengthening as tolerated for functional reach/lifting improvements  KJana Hakim SPT 10/08/21, 4:06 PM  This entire session of physical therapy was performed under the direct supervision of PT signing evaluation /treatment. PT reviewed note and agrees.  MScot Jun PT, DPT, OCS, ATC 10/08/21  2:30 PM

## 2021-10-13 ENCOUNTER — Encounter: Payer: 59 | Admitting: Rehabilitative and Restorative Service Providers"

## 2021-10-20 ENCOUNTER — Ambulatory Visit (INDEPENDENT_AMBULATORY_CARE_PROVIDER_SITE_OTHER): Payer: 59 | Admitting: Rehabilitative and Restorative Service Providers"

## 2021-10-20 ENCOUNTER — Encounter: Payer: Self-pay | Admitting: Rehabilitative and Restorative Service Providers"

## 2021-10-20 DIAGNOSIS — M6281 Muscle weakness (generalized): Secondary | ICD-10-CM

## 2021-10-20 DIAGNOSIS — R600 Localized edema: Secondary | ICD-10-CM

## 2021-10-20 DIAGNOSIS — R293 Abnormal posture: Secondary | ICD-10-CM | POA: Diagnosis not present

## 2021-10-20 DIAGNOSIS — M25511 Pain in right shoulder: Secondary | ICD-10-CM | POA: Diagnosis not present

## 2021-10-20 DIAGNOSIS — G8929 Other chronic pain: Secondary | ICD-10-CM

## 2021-10-20 NOTE — Therapy (Signed)
OUTPATIENT PHYSICAL THERAPY TREATMENT NOTE [place]   Patient Name: Patricia French MRN: [id number] DOB:May 17, 1975, 46 y.o., female Today's Date: 10/20/2021  PHYSICAL THERAPY DISCHARGE SUMMARY  Visits from Start of Care: 9  Current functional level related to goals / functional outcomes: See note   Remaining deficits: See note   Education / Equipment: HEP  Patient goals were met. Patient is being discharged due to meeting the stated rehab goals.    END OF SESSION:   PT End of Session - 10/20/21 1454     Visit Number 9    Number of Visits 20    Date for PT Re-Evaluation 10/27/21    PT Start Time 8887    PT Stop Time 1500    PT Time Calculation (min) 23 min    Activity Tolerance Patient tolerated treatment well    Behavior During Therapy WFL for tasks assessed/performed              Past Medical History:  Diagnosis Date   Anxiety    HSV-2 (herpes simplex virus 2) infection    Hyperlipidemia    Past Surgical History:  Procedure Laterality Date   CESAREAN SECTION     CHOLECYSTECTOMY     keloid removal     PARTIAL HYSTERECTOMY     TONSILLECTOMY     tummy tuck     Patient Active Problem List   Diagnosis Date Noted   Vitamin D deficiency 04/03/2018   Fibroid 07/02/2013   Menorrhagia 07/02/2013   THERAPY DIAG:  Chronic right shoulder pain  Muscle weakness (generalized)  Localized edema  Abnormal posture  PCP: [name] FNP   REFERRING PROVIDER: Meredith Pel, MD   REFERRING DIAG: [phone] (ICD-10-CM) - Nontraumatic complete tear of right rotator cuff   Rationale for Evaluation and Treatment Rehabilitation   ONSET DATE: Surgery 07/13/2021   SUBJECTIVE:                                                                                                                                                                                       SUBJECTIVE STATEMENT: Pt indicated probably about the same as last time (steady progression).  Not  worse.   Pt indicated reaching behind back is still most noted at this time.  Reaching overhead is getting better.      PERTINENT HISTORY: Unremarkable     PAIN:  NPRS scale: no pain upon arrival (reported tenderness on incision) Pain location: Rt shoulder Pain description: sore Aggravating factors: general soreness Relieving factors: ice   PRECAUTIONS: Shoulder - RTC repair c biceps tenodesis.  ROM active/passive for 2 weeks(until 09/01/2021) then ok for strengthening   WEIGHT  BEARING RESTRICTIONS No   OBJECTIVE:    PATIENT SURVEYS:  10/20/2021:  FOTO update 78  09/14/2021 : FOTO update:  60  08/18/2021 FOTO intake:   45  predicted:   65  POSTURE: 08/18/2021 Mild rounded shoulders in sitting, easily corrected c verbal cues.    UPPER EXTREMITY ROM:                08/18/2021:  pain end range limited for Rt shoulder .  Seated Rt shoulder elevation flexion to 65 degrees c shrug noted   AROM (PROM) Right 08/18/2021 Left 08/18/2021 Right 08/24/21 Right AROM in supine  08/31/2021 Right  AROM 10/08/2021  Shoulder flexion 110 (115) WFL AA: 131 144 136 against gravity in sitting  Shoulder extension         Shoulder abduction 90(95)    100 113 in supine  Shoulder adduction         Shoulder internal rotation 50 in 30 deg abduction     70 AROM in 45 deg abduction  Seated functional IR to contralateral PSIS and painful  Shoulder external rotation 20 in 30 deg abduction   AA: 40 43 in 30 deg abduction 45 AROM in 45 deg abduction  Elbow flexion         Elbow extension         Wrist flexion         Wrist extension         Wrist ulnar deviation         Wrist radial deviation         Wrist pronation         Wrist supination         (Blank rows = not tested)   UPPER EXTREMITY MMT:   MMT Right 08/18/2021 Not tested due to surgery protocol c resistance  Right 09/08/2021 Right 09/14/2021 Right 10/08/2021 Right 10/20/2021  Shoulder flexion    3+/5 4+/5 5/5 5/5  Shoulder extension          Shoulder abduction     4/5 4/5 5/5  Shoulder adduction         Shoulder internal rotation     5/5 5/5 5/5  Shoulder external rotation     4/5 4/5 5/5  Middle trapezius         Lower trapezius         Elbow flexion         Elbow extension         Wrist flexion         Wrist extension         Wrist ulnar deviation         Wrist radial deviation         Wrist pronation         Wrist supination         Grip strength (lbs)         (Blank rows = not tested)   SHOULDER SPECIAL TESTS: 08/18/2021  : none tested   JOINT MOBILITY TESTING:  08/18/2021 No restriction inferior jt mobs Rt GH joint in mid range   PALPATION:  08/18/2021 Mild tenderness Rt anterior/posterior shoulder generally.              Visual inspection of localized edema Rt shoulder              TODAY'S TREATMENT:  10/20/2021: TherEx: Seated posterior capsule stretch 1 x 30s Supine sleeper stretch 1 x 30s Review of existing HEP  to ensure good knowledge and technique for home.    Manual: Supine Rt shoulder IR stretching contract/relax, posterior glide.   10/08/2021: TherEx: Pulleys flexion, scaption 2 mins each way  Seated posterior capsule stretch 1 x 30s Supine sleeper stretch 2 x 30s Standing doorway ER stretch 1 x 30s Green band ER 2 x 10 Standing flexion and scaption AROM PT printed new HEP handout, pt verbalized understanding  Self Care:  PT demo and explain scare mobilization techniques, pt verbalized understanding  09/14/2021: Therex:  Pulleys flexion, scaption 2 mins each way   Supine horizontal abduction green band 2 x 10  Supine Rt shoulder 90 deg flexion circles cw, ccw  30 x 2 bilateral  3 lbs  Supine Rt shoulder 90 deg flexion SA hold 3 lbs 5 sec 2 x 10    Standing blue band rows, gh ext 2 x 15 each  Standing blue band reactive ER hold walk outs 5 sec hold x 15 c towel at side  Standing shoulder flexion to 90 deg c eccentric lowering slow focus x 15 - 1 lb   Wall push up 2 x 10 c SA press  away at top of movement.     PATIENT EDUCATION: 09/02/2021 Education details: HEP progression Person educated: Patient Education method: Consulting civil engineer, Demonstration, Verbal cues, and Handouts Education comprehension: verbalized understanding, returned demonstration, and verbal cues required     HOME EXERCISE PROGRAM: Access Code: [email or web address] URL: [email or web address] Date: 10/08/2021 Prepared by: Scot Jun  Exercises - Standing Bilateral Low Shoulder Row with Anchored Resistance  - 1-2 x daily - 7 x weekly - 1-2 sets - 10-15 reps - Shoulder Extension with Resistance  - 1-2 x daily - 7 x weekly - 1-2 sets - 10-15 reps - Standing Shoulder Posterior Capsule Stretch (Mirrored)  - 2 x daily - 7 x weekly - 1 sets - 5 reps - 15-30 hold - Sleeper Stretch (Mirrored)  - 2 x daily - 7 x weekly - 1 sets - 5 reps - 15-30 hold - Standing Shoulder Flexion to 90 Degrees with Dumbbells  - 1-2 x daily - 7 x weekly - 1-2 sets - 10 reps - Shoulder Abduction with Dumbbells - Thumbs Up  - 1-2 x daily - 7 x weekly - 1-2 sets - 10 reps - Single Arm Doorway Pec Stretch at 90 Degrees Abduction  - 2 x daily - 7 x weekly - 1 sets - 5 reps - 30 hold - Shoulder External Rotation with Anchored Resistance  - 2 x daily - 7 x weekly - 3 sets - 10 reps   ASSESSMENT:   CLINICAL IMPRESSION: Pt has attended 9 visits overall during course of treatment.  Good progress noted at this time.  Presentation of gains in objective data support reaching goals as established.  Recommend discharge to HEP at this time due to improvement.    OBJECTIVE IMPAIRMENTS decreased activity tolerance, decreased coordination, decreased endurance, decreased mobility, decreased ROM, decreased strength, hypomobility, increased edema, increased fascial restrictions, impaired perceived functional ability, increased muscle spasms, impaired flexibility, impaired UE functional use, improper body mechanics, postural dysfunction, and pain.     ACTIVITY LIMITATIONS carrying, lifting, sleeping, bathing, toileting, dressing, and reach over head   PARTICIPATION LIMITATIONS: meal prep, cleaning, laundry, interpersonal relationship, driving, community activity, occupation, and yard work   PERSONAL FACTORS No specific factors are affecting patient's functional outcome.    REHAB POTENTIAL: Good   CLINICAL DECISION MAKING: Stable/uncomplicated   EVALUATION COMPLEXITY: Low  GOALS: Goals reviewed with patient? Yes   Short term PT Goals (target date for Short term goals are 3 weeks 09/08/2021) Patient will demonstrate independent use of home exercise program to maintain progress from in clinic treatments. Goal status: MET - 08/31/2021   Long term PT goals (target dates for all long term goals are 10 weeks  10/27/2021 )   1. Patient will demonstrate/report pain at worst less than or equal to 2/10 to facilitate minimal limitation in daily activity secondary to pain symptoms. Goal status: Met - 10/20/2021   2. Patient will demonstrate independent use of home exercise program to facilitate ability to maintain/progress functional gains from skilled physical therapy services. Goal status: Met - 10/20/2021   3. Patient will demonstrate FOTO outcome > or = 65 % to indicate reduced disability due to condition. Goal status: Met - 10/20/2021   4.  Patient will demonstrate Rt [place] joint mobility WFL to facilitate usual self care, dressing, reaching overhead at PLOF s limitation due to symptoms.  Goal status: Mostly Met - 10/20/2021   5.  Patient will demonstrate/report ability to return to work at [employer].    Goal status: Met - 10/20/2021   6.  Patient will demonstrate ability to lift/carry grandkids for normal life activity.   Goal status: Met - 10/20/2021       PLAN: PT FREQUENCY:  2-3x/week to start then 1-2 when appropriate   PT DURATION: 10 weeks   PLANNED INTERVENTIONS: Therapeutic exercises, Therapeutic activity, Neuro Muscular  re-education, Balance training, Gait training, Patient/Family education, Joint mobilization, Stair training, DME instructions, Dry Needling, Electrical stimulation, Cryotherapy, Moist heat, Taping, Ultrasound, Ionotophoresis 66m/ml Dexamethasone, and Manual therapy.  All included unless contraindicated   PLAN FOR NEXT SESSION: Discharge   MScot Jun PT, DPT, OCS, ATC 10/20/21  3:04 PM

## 2021-10-30 ENCOUNTER — Ambulatory Visit: Payer: 59 | Admitting: Surgical

## 2021-11-05 ENCOUNTER — Encounter: Payer: Self-pay | Admitting: Surgical

## 2021-11-05 ENCOUNTER — Ambulatory Visit: Payer: 59 | Admitting: Surgical

## 2021-11-05 DIAGNOSIS — M75121 Complete rotator cuff tear or rupture of right shoulder, not specified as traumatic: Secondary | ICD-10-CM | POA: Diagnosis not present

## 2021-11-05 NOTE — Progress Notes (Signed)
Post-Op Visit Note   Patient: Patricia French           Date of Birth: 1975-04-26           MRN: [id number] Visit Date: 11/05/2021 PCP: [name], FNP   Assessment & Plan:  Chief Complaint:  Chief Complaint  Patient presents with   Follow-up    right shoulder rotator cuff repair and biceps tenodesis on 07/13/2021      Visit Diagnoses:  1. Nontraumatic complete tear of right rotator cuff     Plan: Patient is a 46 year old female who presents for repeat evaluation following right shoulder rotator cuff repair and biceps tenodesis on 07/13/2021.  She is doing well overall.  Feels that her pain is steadily improving.  She is able to sleep through the night and occasional sleep on her right shoulder without difficulty.  No medications that she has to take for pain.  Very occasional rare catching sensation but overall this is not a problem for her.  She has discontinued physical therapy and transition to home exercise program about 3 times per week consisting of using a shoulder pulley and exercise bands.  Overall she is very pleased with her result from shoulder surgery currently.  Main activity she wants to get back to doing is dancing.  On exam, patient has 30 degrees X rotation, 85 degrees abduction, 150 degrees forward flexion.  No coarse grinding or crepitus noted with passive motion of the shoulder.  Incisions are well-healed with some hypertrophic keloid formation in the mini open incision anteriorly.  Axillary nerve is intact with deltoid firing.  She has 2+ radial pulse of the operative extremity.  Full active range of motion equivalent to passive range of motion.  Excellent rotator cuff strength of supra, infra, subscap with no significant pain reproduction and strength rated 5/5.  No Popeye deformity noted.  Excellent supination and bicep flexion strength.  Plan is to continue with home exercise program.  She will reach out to her dermatologist for evaluation of keloid formation in  the mini open incision and consideration of cortisone injections into the keloid..  She does have some tenderness overlying this incision.  She was prone to these keloids and this is more of a recent development as she did have this at her last appointment.  Follow-up with the office as needed.  Follow-Up Instructions: No follow-ups on file.   Orders:  No orders of the defined types were placed in this encounter.  No orders of the defined types were placed in this encounter.   Imaging: No results found.  PMFS History: Patient Active Problem List   Diagnosis Date Noted   Vitamin D deficiency 04/03/2018   Fibroid 07/02/2013   Menorrhagia 07/02/2013   Past Medical History:  Diagnosis Date   Anxiety    HSV-2 (herpes simplex virus 2) infection    Hyperlipidemia     Family History  Problem Relation Age of Onset   Diabetes Mother    Hypertension Mother    Hyperlipidemia Mother    Heart disease Mother    Varicose Veins Mother    Anxiety disorder Mother    Heart attack Father    Bipolar disorder Father    Schizophrenia Father    Hyperlipidemia Father    Alzheimer's disease Father    Cancer Maternal Grandmother    Migraines Maternal Grandmother    Hypertension Maternal Grandmother    Alzheimer's disease Paternal Grandmother    Leukemia Paternal Grandfather    Colon  cancer Neg Hx    Colon polyps Neg Hx    Esophageal cancer Neg Hx    Rectal cancer Neg Hx    Stomach cancer Neg Hx     Past Surgical History:  Procedure Laterality Date   CESAREAN SECTION     CHOLECYSTECTOMY     keloid removal     PARTIAL HYSTERECTOMY     TONSILLECTOMY     tummy tuck     Social History   Occupational History   Not on file  Tobacco Use   Smoking status: Never   Smokeless tobacco: Never  Vaping Use   Vaping Use: Never used  Substance and Sexual Activity   Alcohol use: Yes    Comment: occas.   Drug use: No   Sexual activity: Not on file

## 2021-11-15 NOTE — Progress Notes (Signed)
Office Visit Note   Patient: Patricia French           Date of Birth: 09/04/1975           MRN: [id number] Visit Date: 06/12/2021              Requested by: [name], [address] [place],  [place] [address] PCP: [name], FNP   Assessment & Plan: Visit Diagnoses: No diagnosis found.  Plan: I will schedule patient appointment to see Dr. Marlou Sa to discuss possible surgical intervention for her right shoulder.  All questions answered.  Follow-Up Instructions: Return in about 2 weeks (around 06/26/2021) for discuss surgery rotator cuff repair.   Orders:  No orders of the defined types were placed in this encounter.  No orders of the defined types were placed in this encounter.     Procedures: No procedures performed   Clinical Data: No additional findings.   Subjective: Chief Complaint  Patient presents with   Right Shoulder - Follow-up    HPI 46 year old female returns for review of her right shoulder MRI scan that was done June 08, 2021.  Study showed:  EXAM: MRI OF THE RIGHT SHOULDER WITHOUT CONTRAST   TECHNIQUE: Multiplanar, multisequence MR imaging of the shoulder was performed. No intravenous contrast was administered.   COMPARISON:  X-ray 04/02/2021   FINDINGS: Rotator cuff: Focal full-thickness tear of the mid supraspinatus tendon insertion with minimal retraction. Tear measures approximately 8 mm in AP dimension. Background of mild tendinosis. Infraspinatus, subscapularis, and teres minor tendons appear intact.   Muscles: Preserved bulk and signal intensity of the rotator cuff musculature without edema, atrophy, or fatty infiltration.   Biceps long head:  Intra-articular biceps tendinosis.   Acromioclavicular Joint: Mild-moderate degenerative changes of the AC joint. Small volume subacromial-subdeltoid bursal fluid communicating with the glenohumeral joint.   Glenohumeral Joint: No joint effusion. No focal cartilage  defect.   Labrum: Grossly intact although evaluation is limited in the absence of intra-articular contrast. No paralabral cyst.   Bones: No acute fracture. No dislocation. No bone marrow edema. No marrow replacing bone lesion.   Other: None.   IMPRESSION: 1. Small focal full-thickness tear of the distal supraspinatus tendon. 2. Intra-articular biceps tendinosis. 3. Mild-moderate degenerative changes of the [facility] joint.     Electronically Signed   By: Davina Poke D.O.   On: 06/09/2021 10:56  Continues to have shoulder pain.  States that the ganglion cyst on her left hand is gone.  Objective: Vital Signs: BP (!) 143/95   Pulse 76   Ht '5\' 3"'$  (1.6 m)   Wt 165 lb (74.8 kg)   BMI 29.23 kg/m   Physical Exam Pleasant female alert and oriented in no acute distress. Ortho Exam  Specialty Comments:  No specialty comments available.  Imaging: No results found.   PMFS History: Patient Active Problem List   Diagnosis Date Noted   Vitamin D deficiency 04/03/2018   Fibroid 07/02/2013   Menorrhagia 07/02/2013   Past Medical History:  Diagnosis Date   Anxiety    HSV-2 (herpes simplex virus 2) infection    Hyperlipidemia     Family History  Problem Relation Age of Onset   Diabetes Mother    Hypertension Mother    Hyperlipidemia Mother    Heart disease Mother    Varicose Veins Mother    Anxiety disorder Mother    Heart attack Father    Bipolar disorder Father    Schizophrenia Father  Hyperlipidemia Father    Alzheimer's disease Father    Cancer Maternal Grandmother    Migraines Maternal Grandmother    Hypertension Maternal Grandmother    Alzheimer's disease Paternal Grandmother    Leukemia Paternal Grandfather    Colon cancer Neg Hx    Colon polyps Neg Hx    Esophageal cancer Neg Hx    Rectal cancer Neg Hx    Stomach cancer Neg Hx     Past Surgical History:  Procedure Laterality Date   CESAREAN SECTION     CHOLECYSTECTOMY     keloid removal      PARTIAL HYSTERECTOMY     TONSILLECTOMY     tummy tuck     Social History   Occupational History   Not on file  Tobacco Use   Smoking status: Never   Smokeless tobacco: Never  Vaping Use   Vaping Use: Never used  Substance and Sexual Activity   Alcohol use: Yes    Comment: occas.   Drug use: No   Sexual activity: Not on file

## 2021-12-31 ENCOUNTER — Encounter: Payer: Self-pay | Admitting: Nurse Practitioner

## 2022-01-07 ENCOUNTER — Telehealth: Payer: 59 | Admitting: Family Medicine

## 2022-01-07 DIAGNOSIS — B9689 Other specified bacterial agents as the cause of diseases classified elsewhere: Secondary | ICD-10-CM | POA: Diagnosis not present

## 2022-01-07 DIAGNOSIS — J019 Acute sinusitis, unspecified: Secondary | ICD-10-CM

## 2022-01-07 DIAGNOSIS — B379 Candidiasis, unspecified: Secondary | ICD-10-CM | POA: Diagnosis not present

## 2022-01-07 DIAGNOSIS — R051 Acute cough: Secondary | ICD-10-CM | POA: Diagnosis not present

## 2022-01-07 DIAGNOSIS — T3695XA Adverse effect of unspecified systemic antibiotic, initial encounter: Secondary | ICD-10-CM

## 2022-01-07 MED ORDER — FLUCONAZOLE 150 MG PO TABS: 150.0000 mg | ORAL_TABLET | Freq: Once | ORAL | 0 refills | Status: AC

## 2022-01-07 MED ORDER — DOXYCYCLINE HYCLATE 100 MG PO TABS: 100.0000 mg | ORAL_TABLET | Freq: Two times a day (BID) | ORAL | 0 refills | Status: AC

## 2022-01-07 MED ORDER — PROMETHAZINE-DM 6.25-15 MG/5ML PO SYRP: 5.0000 mL | ORAL_SOLUTION | Freq: Four times a day (QID) | ORAL | 0 refills | Status: DC | PRN

## 2022-01-07 NOTE — Progress Notes (Signed)
Virtual Visit Consent   Patricia French, you are scheduled for a virtual visit with a [facility] provider today. Just as with appointments in the office, your consent must be obtained to participate. Your consent will be active for this visit and any virtual visit you may have with one of our providers in the next 365 days. If you have a MyChart account, a copy of this consent can be sent to you electronically.  As this is a virtual visit, video technology does not allow for your provider to perform a traditional examination. This may limit your provider's ability to fully assess your condition. If your provider identifies any concerns that need to be evaluated in person or the need to arrange testing (such as labs, EKG, etc.), we will make arrangements to do so. Although advances in technology are sophisticated, we cannot ensure that it will always work on either your end or our end. If the connection with a video visit is poor, the visit may have to be switched to a telephone visit. With either a video or telephone visit, we are not always able to ensure that we have a secure connection.  By engaging in this virtual visit, you consent to the provision of healthcare and authorize for your insurance to be billed (if applicable) for the services provided during this visit. Depending on your insurance coverage, you may receive a charge related to this service.  I need to obtain your verbal consent now. Are you willing to proceed with your visit today? [name] has provided verbal consent on 01/07/2022 for a virtual visit (video or telephone). Perlie Mayo, NP  Date: 01/07/2022 10:36 AM  Virtual Visit via Video Note   I, Perlie Mayo, connected with  [name]  ([id number], Mar 28, 1975) on 01/07/22 at 10:30 AM EST by a video-enabled telemedicine application and verified that I am speaking with the correct person using two identifiers.  Location: Patient: Virtual Visit Location Patient:  Home Provider: Virtual Visit Location Provider: Home Office   I discussed the limitations of evaluation and management by telemedicine and the availability of in person appointments. The patient expressed understanding and agreed to proceed.    History of Present Illness: Patricia French is a 46 y.o. who identifies as a female who was assigned female at birth, and is being seen today for cough and sore throat. Presenting today with increased URI symptoms- increased congestion, copious amounts of mucus, cough, sore throat related to drainage, mild facial tenderness, ear pain- but has improved. Was using thera flu a week prior to friday of last week then was seen at [facility] (note in EPIC), and started using honey cough meds, elderberry, and mucinexs (full box this week) Denies chest pain, shortness of breath, tested for COVID and Flu last week all negative.    Problems:  Patient Active Problem List   Diagnosis Date Noted   Vitamin D deficiency 04/03/2018   Fibroid 07/02/2013   Menorrhagia 07/02/2013    Allergies:  Allergies  Allergen Reactions   Penicillins Hives   Pork-Derived Products    Medications:  Current Outpatient Medications:    LORazepam (ATIVAN) 0.5 MG tablet, Take one tablet one hour before MRI scan and another immediately before if needed.  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., Disp: 60 tablet, Rfl: 0   methocarbamol (ROBAXIN) 500 MG tablet, Take 1 tablet (500 mg total) by mouth every 8 (eight) hours as needed for muscle spasms., Disp: 30 tablet, Rfl: 1  oxyCODONE-acetaminophen (PERCOCET) 5-325 MG tablet, Take 1 tablet by mouth every 4 (four) hours as needed for severe pain., Disp: 30 tablet, Rfl: 0   ALPRAZolam (XANAX) 0.5 MG tablet, alprazolam 0.5 mg tablet, Disp: , Rfl:    Ascorbic Acid (VITAMIN C) 1000 MG tablet, Take by mouth., Disp: , Rfl:    celecoxib (CELEBREX) 100 MG capsule, TAKE 1 CAPSULE(100 MG) BY MOUTH TWICE DAILY, Disp: 60 capsule, Rfl: 0   Cetirizine  HCl 10 MG CAPS, cetirizine 10 mg capsule  Take by oral route., Disp: , Rfl:    Cyanocobalamin (VITAMIN B-12 PO), Vitamin B-12, Disp: , Rfl:    estradiol (VIVELLE-DOT) 0.075 MG/24HR, Place 1 patch onto the skin daily at 6 (six) AM., Disp: , Rfl:    fluticasone (FLONASE) 50 MCG/ACT nasal spray, fluticasone propionate 50 mcg/actuation nasal spray,suspension, Disp: , Rfl:    Magnesium 250 MG TABS, Take 1 tablet (250 mg total) by mouth daily. Take with evening meal, Disp: 90 tablet, Rfl: 1   phentermine 37.5 MG capsule, Take 1 capsule (37.5 mg total) by mouth every morning., Disp: 30 capsule, Rfl: 1   rosuvastatin (CRESTOR) 5 MG tablet, Take 1 tablet (5 mg total) by mouth daily., Disp: 90 tablet, Rfl: 1   Saccharomyces boulardii (PROBIOTIC) 250 MG CAPS, Take 1 capsule by mouth daily., Disp: , Rfl:    valACYclovir (VALTREX) 500 MG tablet, Take 500 mg by mouth daily at 6 (six) AM., Disp: , Rfl:    Vitamin D, Ergocalciferol, (DRISDOL) 1.25 MG (50000 UNIT) CAPS capsule, Take 1 capsule by mouth daily at 6 (six) AM., Disp: , Rfl:    Zinc 20 MG CAPS, Take 1 capsule by mouth daily., Disp: , Rfl:   Observations/Objective: Patient is well-developed, well-nourished in no acute distress.  Resting comfortably  at home.  Head is normocephalic, atraumatic.  No labored breathing.  Speech is clear and coherent with logical content.  Patient is alert and oriented at baseline.  Cough Congestion, nasal tone noted  Assessment and Plan: 1. Acute bacterial sinusitis  - fluconazole (DIFLUCAN) 150 MG tablet; Take 1 tablet (150 mg total) by mouth once for 1 dose.  Dispense: 1 tablet; Refill: 0 - promethazine-dextromethorphan (PROMETHAZINE-DM) 6.25-15 MG/5ML syrup; Take 5 mLs by mouth 4 (four) times daily as needed for cough.  Dispense: 118 mL; Refill: 0  2. Antibiotic-induced yeast infection  - doxycycline (VIBRA-TABS) 100 MG tablet; Take 1 tablet (100 mg total) by mouth 2 (two) times daily for 10 days.  Dispense: 20  tablet; Refill: 0  3. Acute cough  - promethazine-dextromethorphan (PROMETHAZINE-DM) 6.25-15 MG/5ML syrup; Take 5 mLs by mouth 4 (four) times daily as needed for cough.  Dispense: 118 mL; Refill: 0  -Take meds as prescribed -Rest -Use a cool mist humidifier especially during the winter months when heat dries out the air. - Use saline nose sprays frequently to help soothe nasal passages and promote drainage. -Saline irrigations of the nose can be very helpful if done frequently.             * 4X daily for 1 week*             * Use of a nettie pot can be helpful with this.  *Follow directions with this* *Boiled or distilled water only -stay hydrated by drinking plenty of fluids - Keep thermostat turn down low to prevent drying out sinuses - For any cough or congestion- robitussin DM or Delsym as needed - For fever or aches or pains-  take tylenol or ibuprofen as directed on bottle             * for fevers greater than 101 orally you may alternate ibuprofen and tylenol every 3 hours.  If you do not improve you will need a follow up visit in person.  - Develops yeast post Abx use, Diflucan provided               Reviewed side effects, risks and benefits of medication.    Patient acknowledged agreement and understanding of the plan.   Past Medical, Surgical, Social History, Allergies, and Medications have been Reviewed.   Follow Up Instructions: I discussed the assessment and treatment plan with the patient. The patient was provided an opportunity to ask questions and all were answered. The patient agreed with the plan and demonstrated an understanding of the instructions.  A copy of instructions were sent to the patient via MyChart unless otherwise noted below.     The patient was advised to call back or seek an in-person evaluation if the symptoms worsen or if the condition fails to improve as anticipated.  Time:  I spent 11 minutes with the patient via telehealth technology  discussing the above problems/concerns.    Perlie Mayo, NP

## 2022-01-07 NOTE — Patient Instructions (Addendum)
[name], thank you for joining Perlie Mayo, NP for today's virtual visit.  While this provider is not your primary care provider (PCP), if your PCP is located in our provider database this encounter information will be shared with them immediately following your visit.   [facility] account gives you access to today's visit and all your visits, tests, and labs performed at [facility] " click here if you don't have a [facility] account or go to [email or web address]  Consent: (Patient) Patricia French provided verbal consent for this virtual visit at the beginning of the encounter.  Current Medications:  Current Outpatient Medications:    doxycycline (VIBRA-TABS) 100 MG tablet, Take 1 tablet (100 mg total) by mouth 2 (two) times daily for 10 days., Disp: 20 tablet, Rfl: 0   fluconazole (DIFLUCAN) 150 MG tablet, Take 1 tablet (150 mg total) by mouth once for 1 dose., Disp: 1 tablet, Rfl: 0   LORazepam (ATIVAN) 0.5 MG tablet, Take one tablet one hour before MRI scan and another immediately before if needed.  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., Disp: 60 tablet, Rfl: 0   methocarbamol (ROBAXIN) 500 MG tablet, Take 1 tablet (500 mg total) by mouth every 8 (eight) hours as needed for muscle spasms., Disp: 30 tablet, Rfl: 1   oxyCODONE-acetaminophen (PERCOCET) 5-325 MG tablet, Take 1 tablet by mouth every 4 (four) hours as needed for severe pain., Disp: 30 tablet, Rfl: 0   promethazine-dextromethorphan (PROMETHAZINE-DM) 6.25-15 MG/5ML syrup, Take 5 mLs by mouth 4 (four) times daily as needed for cough., Disp: 118 mL, Rfl: 0   ALPRAZolam (XANAX) 0.5 MG tablet, alprazolam 0.5 mg tablet, Disp: , Rfl:    Ascorbic Acid (VITAMIN C) 1000 MG tablet, Take by mouth., Disp: , Rfl:    celecoxib (CELEBREX) 100 MG capsule, TAKE 1 CAPSULE(100 MG) BY MOUTH TWICE DAILY, Disp: 60 capsule, Rfl: 0   Cetirizine HCl 10 MG CAPS, cetirizine 10 mg capsule  Take by oral  route., Disp: , Rfl:    Cyanocobalamin (VITAMIN B-12 PO), Vitamin B-12, Disp: , Rfl:    estradiol (VIVELLE-DOT) 0.075 MG/24HR, Place 1 patch onto the skin daily at 6 (six) AM., Disp: , Rfl:    fluticasone (FLONASE) 50 MCG/ACT nasal spray, fluticasone propionate 50 mcg/actuation nasal spray,suspension, Disp: , Rfl:    Magnesium 250 MG TABS, Take 1 tablet (250 mg total) by mouth daily. Take with evening meal, Disp: 90 tablet, Rfl: 1   phentermine 37.5 MG capsule, Take 1 capsule (37.5 mg total) by mouth every morning., Disp: 30 capsule, Rfl: 1   rosuvastatin (CRESTOR) 5 MG tablet, Take 1 tablet (5 mg total) by mouth daily., Disp: 90 tablet, Rfl: 1   Saccharomyces boulardii (PROBIOTIC) 250 MG CAPS, Take 1 capsule by mouth daily., Disp: , Rfl:    valACYclovir (VALTREX) 500 MG tablet, Take 500 mg by mouth daily at 6 (six) AM., Disp: , Rfl:    Vitamin D, Ergocalciferol, (DRISDOL) 1.25 MG (50000 UNIT) CAPS capsule, Take 1 capsule by mouth daily at 6 (six) AM., Disp: , Rfl:    Zinc 20 MG CAPS, Take 1 capsule by mouth daily., Disp: , Rfl:    Medications ordered in this encounter:  Meds ordered this encounter  Medications   doxycycline (VIBRA-TABS) 100 MG tablet    Sig: Take 1 tablet (100 mg total) by mouth 2 (two) times daily for 10 days.    Dispense:  20 tablet    Refill:  0    Order Specific Question:   Supervising Provider    Answer:   Chase Picket [[id number]]   fluconazole (DIFLUCAN) 150 MG tablet    Sig: Take 1 tablet (150 mg total) by mouth once for 1 dose.    Dispense:  1 tablet    Refill:  0    Order Specific Question:   Supervising Provider    Answer:   Chase Picket [id number]   promethazine-dextromethorphan (PROMETHAZINE-DM) 6.25-15 MG/5ML syrup    Sig: Take 5 mLs by mouth 4 (four) times daily as needed for cough.    Dispense:  118 mL    Refill:  0    Order Specific Question:   Supervising Provider    Answer:   Chase Picket [[id number]]     *If you need refills on other  medications prior to your next appointment, please contact your pharmacy*  Follow-Up: Call back or seek an in-person evaluation if the symptoms worsen or if the condition fails to improve as anticipated.  [place] [phone]  Other Instructions  Happy Thanksgiving!  -Take meds as prescribed -Rest -Use a cool mist humidifier especially during the winter months when heat dries out the air. - Use saline nose sprays frequently to help soothe nasal passages and promote drainage. -Saline irrigations of the nose can be very helpful if done frequently.             * 4X daily for 1 week*             * Use of a nettie pot can be helpful with this.  *Follow directions with this* *Boiled or distilled water only -stay hydrated by drinking plenty of fluids - Keep thermostat turn down low to prevent drying out sinuses - For any cough or congestion- robitussin DM or Delsym as needed - For fever or aches or pains- take tylenol or ibuprofen as directed on bottle             * for fevers greater than 101 orally you may alternate ibuprofen and tylenol every 3 hours.  If you do not improve you will need a follow up visit in person.                  If you have been instructed to have an in-person evaluation today at a local Urgent Care facility, please use the link below. It will take you to a list of all of our available [facility], including address, phone number and hours of operation. Please do not delay care.  [facility]  If you or a family member do not have a primary care provider, use the link below to schedule a visit and establish care. When you choose a [facility] primary care physician or advanced practice provider, you gain a long-term partner in health. Find a Primary Care Provider  Learn more about 's in-office and virtual care options: [facility]

## 2022-02-24 ENCOUNTER — Encounter: Payer: Self-pay | Admitting: Nurse Practitioner

## 2022-02-25 ENCOUNTER — Encounter: Payer: Self-pay | Admitting: Nurse Practitioner

## 2022-02-25 ENCOUNTER — Ambulatory Visit: Payer: 59 | Admitting: Nurse Practitioner

## 2022-02-25 VITALS — BP 126/68 | HR 83 | Temp 98.5°F | Ht 63.0 in | Wt 182.0 lb

## 2022-02-25 DIAGNOSIS — M545 Low back pain, unspecified: Secondary | ICD-10-CM | POA: Diagnosis not present

## 2022-02-25 DIAGNOSIS — Z20828 Contact with and (suspected) exposure to other viral communicable diseases: Secondary | ICD-10-CM

## 2022-02-25 DIAGNOSIS — E782 Mixed hyperlipidemia: Secondary | ICD-10-CM | POA: Diagnosis not present

## 2022-02-25 DIAGNOSIS — F419 Anxiety disorder, unspecified: Secondary | ICD-10-CM

## 2022-02-25 DIAGNOSIS — E6609 Other obesity due to excess calories: Secondary | ICD-10-CM

## 2022-02-25 DIAGNOSIS — F330 Major depressive disorder, recurrent, mild: Secondary | ICD-10-CM | POA: Diagnosis not present

## 2022-02-25 DIAGNOSIS — Z6832 Body mass index (BMI) 32.0-32.9, adult: Secondary | ICD-10-CM

## 2022-02-25 DIAGNOSIS — Z2821 Immunization not carried out because of patient refusal: Secondary | ICD-10-CM

## 2022-02-25 LAB — POCT URINALYSIS DIPSTICK
Bilirubin, UA: NEGATIVE
Blood, UA: NEGATIVE
Glucose, UA: NEGATIVE
Leukocytes, UA: NEGATIVE
Nitrite, UA: NEGATIVE
Protein, UA: NEGATIVE
Spec Grav, UA: 1.015 (ref 1.010–1.025)
Urobilinogen, UA: 1 E.U./dL
pH, UA: 6 (ref 5.0–8.0)

## 2022-02-25 MED ORDER — PREDNISONE 10 MG (21) PO TBPK: ORAL_TABLET | ORAL | 0 refills | Status: DC

## 2022-02-25 MED ORDER — CYCLOBENZAPRINE HCL 10 MG PO TABS: 5.0000 mg | ORAL_TABLET | Freq: Three times a day (TID) | ORAL | 1 refills | Status: DC | PRN

## 2022-02-25 MED ORDER — VORTIOXETINE HBR 5 MG PO TABS: 5.0000 mg | ORAL_TABLET | Freq: Every day | ORAL | 2 refills | Status: DC

## 2022-02-25 MED ORDER — OSELTAMIVIR PHOSPHATE 75 MG PO CAPS: 75.0000 mg | ORAL_CAPSULE | Freq: Every day | ORAL | 0 refills | Status: AC

## 2022-02-25 NOTE — Patient Instructions (Signed)
Acute Back Pain, Adult Acute back pain is sudden and usually short-lived. It is often caused by an injury to the muscles and tissues in the back. The injury may result from: A muscle, tendon, or ligament getting overstretched or torn. Ligaments are tissues that connect bones to each other. Lifting something improperly can cause a back strain. Wear and tear (degeneration) of the spinal disks. Spinal disks are circular tissue that provide cushioning between the bones of the spine (vertebrae). Twisting motions, such as while playing sports or doing yard work. A hit to the back. Arthritis. You may have a physical exam, lab tests, and imaging tests to find the cause of your pain. Acute back pain usually goes away with rest and home care. Follow these instructions at home: Managing pain, stiffness, and swelling Take over-the-counter and prescription medicines only as told by your health care provider. Treatment may include medicines for pain and inflammation that are taken by mouth or applied to the skin, or muscle relaxants. Your health care provider may recommend applying ice during the first 24-48 hours after your pain starts. To do this: Put ice in a plastic bag. Place a towel between your skin and the bag. Leave the ice on for 20 minutes, 2-3 times a day. Remove the ice if your skin turns bright red. This is very important. If you cannot feel pain, heat, or cold, you have a greater risk of damage to the area. If directed, apply heat to the affected area as often as told by your health care provider. Use the heat source that your health care provider recommends, such as a moist heat pack or a heating pad. Place a towel between your skin and the heat source. Leave the heat on for 20-30 minutes. Remove the heat if your skin turns bright red. This is especially important if you are unable to feel pain, heat, or cold. You have a greater risk of getting burned. Activity  Do not stay in bed. Staying in  bed for more than 1-2 days can delay your recovery. Sit up and stand up straight. Avoid leaning forward when you sit or hunching over when you stand. If you work at a desk, sit close to it so you do not need to lean over. Keep your chin tucked in. Keep your neck drawn back, and keep your elbows bent at a 90-degree angle (right angle). Sit high and close to the steering wheel when you drive. Add lower back (lumbar) support to your car seat, if needed. Take short walks on even surfaces as soon as you are able. Try to increase the length of time you walk each day. Do not sit, drive, or stand in one place for more than 30 minutes at a time. Sitting or standing for long periods of time can put stress on your back. Do not drive or use heavy machinery while taking prescription pain medicine. Use proper lifting techniques. When you bend and lift, use positions that put less stress on your back: Bend your knees. Keep the load close to your body. Avoid twisting. Exercise regularly as told by your health care provider. Exercising helps your back heal faster and helps prevent back injuries by keeping muscles strong and flexible. Work with a physical therapist to make a safe exercise program, as recommended by your health care provider. Do any exercises as told by your physical therapist. Lifestyle Maintain a healthy weight. Extra weight puts stress on your back and makes it difficult to have good   posture. Avoid activities or situations that make you feel anxious or stressed. Stress and anxiety increase muscle tension and can make back pain worse. Learn ways to manage anxiety and stress, such as through exercise. General instructions Sleep on a firm mattress in a comfortable position. Try lying on your side with your knees slightly bent. If you lie on your back, put a pillow under your knees. Keep your head and neck in a straight line with your spine (neutral position) when using electronic equipment like  smartphones or pads. To do this: Raise your smartphone or pad to look at it instead of bending your head or neck to look down. Put the smartphone or pad at the level of your face while looking at the screen. Follow your treatment plan as told by your health care provider. This may include: Cognitive or behavioral therapy. Acupuncture or massage therapy. Meditation or yoga. Contact a health care provider if: You have pain that is not relieved with rest or medicine. You have increasing pain going down into your legs or buttocks. Your pain does not improve after 2 weeks. You have pain at night. You lose weight without trying. You have a fever or chills. You develop nausea or vomiting. You develop abdominal pain. Get help right away if: You develop new bowel or bladder control problems. You have unusual weakness or numbness in your arms or legs. You feel faint. These symptoms may represent a serious problem that is an emergency. Do not wait to see if the symptoms will go away. Get medical help right away. Call your local emergency services (911 in the [place] Do not drive yourself to the hospital. Summary Acute back pain is sudden and usually short-lived. Use proper lifting techniques. When you bend and lift, use positions that put less stress on your back. Take over-the-counter and prescription medicines only as told by your health care provider, and apply heat or ice as told. This information is not intended to replace advice given to you by your health care provider. Make sure you discuss any questions you have with your health care provider. Document Revised: 05/02/2020 Document Reviewed: 05/02/2020 Elsevier Patient Education  2023 [employer]vier Inc.  

## 2022-02-25 NOTE — Progress Notes (Signed)
I,Tianna Badgett,acting as a Education administrator for [employer], FNP.,have documented all relevant documentation on the behalf of [name], FNP,as directed by  [name], FNP while in the presence of [name], [place]. Subjective:     Patient ID: Patricia French , female    DOB: 1975/03/10 , 47 y.o.   MRN: [id number]   Chief Complaint  Patient presents with   Back Pain    HPI  Patient presents today for back pain from flank up to shoulder blades for the last couple weeks. Pain is worse throughout the day. She will have pain that "shoots" down her right leg. She will put more weight on her left side when working. She has ordered a cushion to put in her chair. She will sometimes wake up in the middle of the night with pain to take medications. She has been [facility]. Back pain is an aching pain.   Wt Readings from Last 3 Encounters: 02/25/22 : 182 lb (82.6 kg) 06/12/21 : 165 lb (74.8 kg) 05/25/21 : 165 lb (74.8 kg)  She is seeing a mental health therapist. She has discussed with her about getting on medications for depression. She is crying easily and too much emotion to manage. Her mother is on hospice and relationship issues.   Back Pain This is a new problem. The current episode started more than 1 month ago. The problem occurs constantly. The pain is present in the thoracic spine and lumbar spine. The quality of the pain is described as aching. Radiates to: right leg. The pain is at a severity of 5/10. The pain is Worse during the night. The symptoms are aggravated by sitting. Pertinent negatives include no abdominal pain, dysuria or fever. Risk factors include lack of exercise. She has tried analgesics for the symptoms.     Past Medical History:  Diagnosis Date   Anxiety    HSV-2 (herpes simplex virus 2) infection    Hyperlipidemia      Family History  Problem Relation Age of Onset   Diabetes Mother    Hypertension Mother    Hyperlipidemia Mother    Heart disease Mother     Varicose Veins Mother    Anxiety disorder Mother    Heart attack Father    Bipolar disorder Father    Schizophrenia Father    Hyperlipidemia Father    Alzheimer's disease Father    Cancer Maternal Grandmother    Migraines Maternal Grandmother    Hypertension Maternal Grandmother    Alzheimer's disease Paternal Grandmother    Leukemia Paternal Grandfather    Colon cancer Neg Hx    Colon polyps Neg Hx    Esophageal cancer Neg Hx    Rectal cancer Neg Hx    Stomach cancer Neg Hx      Current Outpatient Medications:    cyclobenzaprine (FLEXERIL) 10 MG tablet, Take 0.5 tablets (5 mg total) by mouth 3 (three) times daily as needed for muscle spasms., Disp: 30 tablet, Rfl: 1   LORazepam (ATIVAN) 0.5 MG tablet, Take one tablet one hour before MRI scan and another immediately before if needed.  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., Disp: 60 tablet, Rfl: 0   oseltamivir (TAMIFLU) 75 MG capsule, Take 1 capsule (75 mg total) by mouth daily for 10 days., Disp: 10 capsule, Rfl: 0   oxyCODONE-acetaminophen (PERCOCET) 5-325 MG tablet, Take 1 tablet by mouth every 4 (four) hours as needed for severe pain., Disp: 30 tablet, Rfl: 0   predniSONE (  STERAPRED UNI-PAK 21 TAB) 10 MG (21) TBPK tablet, Take as directed, Disp: 21 tablet, Rfl: 0   vortioxetine HBr (TRINTELLIX) 5 MG TABS tablet, Take 1 tablet (5 mg total) by mouth daily., Disp: 30 tablet, Rfl: 2   ALPRAZolam (XANAX) 0.5 MG tablet, alprazolam 0.5 mg tablet, Disp: , Rfl:    Ascorbic Acid (VITAMIN C) 1000 MG tablet, Take by mouth., Disp: , Rfl:    celecoxib (CELEBREX) 100 MG capsule, TAKE 1 CAPSULE(100 MG) BY MOUTH TWICE DAILY, Disp: 60 capsule, Rfl: 0   Cetirizine HCl 10 MG CAPS, cetirizine 10 mg capsule  Take by oral route., Disp: , Rfl:    Cyanocobalamin (VITAMIN B-12 PO), Vitamin B-12, Disp: , Rfl:    estradiol (VIVELLE-DOT) 0.075 MG/24HR, Place 1 patch onto the skin daily at 6 (six) AM., Disp: , Rfl:    fluticasone (FLONASE) 50 MCG/ACT  nasal spray, fluticasone propionate 50 mcg/actuation nasal spray,suspension, Disp: , Rfl:    Magnesium 250 MG TABS, Take 1 tablet (250 mg total) by mouth daily. Take with evening meal, Disp: 90 tablet, Rfl: 1   promethazine-dextromethorphan (PROMETHAZINE-DM) 6.25-15 MG/5ML syrup, Take 5 mLs by mouth 4 (four) times daily as needed for cough., Disp: 118 mL, Rfl: 0   rosuvastatin (CRESTOR) 5 MG tablet, Take 1 tablet (5 mg total) by mouth daily., Disp: 90 tablet, Rfl: 1   Saccharomyces boulardii (PROBIOTIC) 250 MG CAPS, Take 1 capsule by mouth daily., Disp: , Rfl:    valACYclovir (VALTREX) 500 MG tablet, Take 500 mg by mouth daily at 6 (six) AM., Disp: , Rfl:    Vitamin D, Ergocalciferol, (DRISDOL) 1.25 MG (50000 UNIT) CAPS capsule, Take 1 capsule by mouth daily at 6 (six) AM., Disp: , Rfl:    Zinc 20 MG CAPS, Take 1 capsule by mouth daily., Disp: , Rfl:    Allergies  Allergen Reactions   Penicillins Hives   Pork-Derived Products      Review of Systems  Constitutional: Negative.  Negative for fever.  Respiratory: Negative.    Cardiovascular: Negative.   Gastrointestinal: Negative.  Negative for abdominal pain.  Genitourinary:  Negative for dysuria.  Musculoskeletal:  Positive for back pain.  Neurological: Negative.      Today's Vitals   02/25/22 1606  BP: 126/68  Pulse: 83  Temp: 98.5 F (36.9 C)  TempSrc: Oral  Weight: 182 lb (82.6 kg)  Height: '5\' 3"'$  (1.6 m)   Body mass index is 32.24 kg/m.  Wt Readings from Last 3 Encounters:  02/25/22 182 lb (82.6 kg)  06/12/21 165 lb (74.8 kg)  05/25/21 165 lb (74.8 kg)    Objective:  Physical Exam Vitals reviewed.  Constitutional:      General: She is not in acute distress.    Appearance: Normal appearance. She is obese.  Cardiovascular:     Rate and Rhythm: Normal rate and regular rhythm.     Pulses: Normal pulses.     Heart sounds: Normal heart sounds. No murmur heard. Pulmonary:     Effort: Pulmonary effort is normal. No  respiratory distress.     Breath sounds: Normal breath sounds. No wheezing.  Abdominal:     General: Bowel sounds are increased.  Musculoskeletal:        General: Tenderness (posterior lumbar thoracic bilaterally.) present. No swelling. Normal range of motion.  Skin:    General: Skin is warm and dry.     Capillary Refill: Capillary refill takes less than 2 seconds.  Neurological:  General: No focal deficit present.     Mental Status: She is alert and oriented to person, place, and time.     Cranial Nerves: No cranial nerve deficit.     Motor: No weakness.  Psychiatric:        Mood and Affect: Mood normal.        Behavior: Behavior normal.        Thought Content: Thought content normal.        Judgment: Judgment normal.         Assessment And Plan:     1. Acute bilateral low back pain without sciatica Comments: Tenderness to palpation mid thoracic back pain.  Will treat as inflammation versus muscle tension.  If not approved will check x-ray.  Urinalysis this is normal - POCT Urinalysis Dipstick (81002) - Culture, Urine - cyclobenzaprine (FLEXERIL) 10 MG tablet; Take 0.5 tablets (5 mg total) by mouth 3 (three) times daily as needed for muscle spasms.  Dispense: 30 tablet; Refill: 1 - predniSONE (STERAPRED UNI-PAK 21 TAB) 10 MG (21) TBPK tablet; Take as directed  Dispense: 21 tablet; Refill: 0  2. Mixed hyperlipidemia Comments: Continue statin, encouraged to eat a low-fat diet  3. Anxiety Comments: Will start on Trintellix to see if this helps.  Continue seeing therapist  4. Mild episode of recurrent major depressive disorder (HCC) Comments: Depression screen score is 9.  Therapist recommends medications.  Will trial her on Trintellix based on genetics test.  Samples given in office with card - vortioxetine HBr (TRINTELLIX) 5 MG TABS tablet; Take 1 tablet (5 mg total) by mouth daily.  Dispense: 30 tablet; Refill: 2  5. Exposure to influenza Comments: Will treat  prophylactically due to the recent exposure with Tamiflu  6. Influenza vaccination declined Comments: Recent exposure to influenza  7. Tetanus, diphtheria, and acellular pertussis (Tdap) vaccination declined  8. Class 1 obesity due to excess calories with serious comorbidity and body mass index (BMI) of 32.0 to 32.9 in adult Comments: Weight gain of approximately 20 pounds.  Discussed possibility causes related to depression and decreased movement.  Will consider medications at next visit After treating for depression to see if this is part of the cause.  Patient was given opportunity to ask questions. Patient verbalized understanding of the plan and was able to repeat key elements of the plan. All questions were answered to their satisfaction.  [name], FNP   I, [name], FNP, have reviewed all documentation for this visit. The documentation on 02/26/22 for the exam, diagnosis, procedures, and orders are all accurate and complete.   IF YOU HAVE BEEN REFERRED TO A SPECIALIST, IT MAY TAKE 1-2 WEEKS TO SCHEDULE/PROCESS THE REFERRAL. IF YOU HAVE NOT HEARD FROM US/SPECIALIST IN TWO WEEKS, PLEASE GIVE [place] A CALL AT [phone].   THE PATIENT IS ENCOURAGED TO PRACTICE SOCIAL DISTANCING DUE TO THE COVID-19 PANDEMIC.

## 2022-02-26 LAB — URINE CULTURE

## 2022-03-02 ENCOUNTER — Ambulatory Visit: Payer: 59 | Admitting: Nurse Practitioner

## 2022-03-26 ENCOUNTER — Encounter: Payer: Self-pay | Admitting: Nurse Practitioner

## 2022-03-29 ENCOUNTER — Ambulatory Visit: Payer: 59 | Admitting: Nurse Practitioner

## 2022-04-12 ENCOUNTER — Ambulatory Visit: Payer: 59 | Admitting: Nurse Practitioner

## 2022-04-20 ENCOUNTER — Ambulatory Visit: Payer: 59 | Admitting: Nurse Practitioner

## 2022-04-20 VITALS — BP 130/64 | HR 88 | Temp 98.4°F | Ht 63.0 in | Wt 176.0 lb

## 2022-04-20 DIAGNOSIS — Z79899 Other long term (current) drug therapy: Secondary | ICD-10-CM

## 2022-04-20 DIAGNOSIS — F419 Anxiety disorder, unspecified: Secondary | ICD-10-CM

## 2022-04-20 DIAGNOSIS — Z2821 Immunization not carried out because of patient refusal: Secondary | ICD-10-CM

## 2022-04-20 DIAGNOSIS — E6609 Other obesity due to excess calories: Secondary | ICD-10-CM | POA: Diagnosis not present

## 2022-04-20 DIAGNOSIS — E66811 Obesity, class 1: Secondary | ICD-10-CM

## 2022-04-20 DIAGNOSIS — Z6831 Body mass index (BMI) 31.0-31.9, adult: Secondary | ICD-10-CM | POA: Diagnosis not present

## 2022-04-20 DIAGNOSIS — E782 Mixed hyperlipidemia: Secondary | ICD-10-CM | POA: Diagnosis not present

## 2022-04-20 NOTE — Patient Instructions (Signed)
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
## 2022-04-26 ENCOUNTER — Other Ambulatory Visit: Payer: 59

## 2022-04-27 ENCOUNTER — Other Ambulatory Visit: Payer: 59

## 2022-04-27 LAB — CMP14+EGFR
ALT: 23 IU/L (ref 0–32)
AST: 23 IU/L (ref 0–40)
Albumin/Globulin Ratio: 2.5 — ABNORMAL HIGH (ref 1.2–2.2)
Albumin: 4.9 g/dL (ref 3.9–4.9)
Alkaline Phosphatase: 80 IU/L (ref 44–121)
BUN/Creatinine Ratio: 13 (ref 9–23)
BUN: 9 mg/dL (ref 6–24)
Bilirubin Total: 0.8 mg/dL (ref 0.0–1.2)
CO2: 23 mmol/L (ref 20–29)
Calcium: 9.5 mg/dL (ref 8.7–10.2)
Chloride: 105 mmol/L (ref 96–106)
Creatinine, Ser: 0.67 mg/dL (ref 0.57–1.00)
Globulin, Total: 2 g/dL (ref 1.5–4.5)
Glucose: 88 mg/dL (ref 70–99)
Potassium: 4.7 mmol/L (ref 3.5–5.2)
Sodium: 145 mmol/L — ABNORMAL HIGH (ref 134–144)
Total Protein: 6.9 g/dL (ref 6.0–8.5)
eGFR: 108 mL/min/{1.73_m2} (ref >59)

## 2022-04-27 LAB — LIPID PANEL
Chol/HDL Ratio: 2.9 ratio (ref 0.0–4.4)
Cholesterol, Total: 174 mg/dL (ref 100–199)
HDL: 59 mg/dL (ref >39)
LDL Chol Calc (NIH): 103 mg/dL — ABNORMAL HIGH (ref 0–99)
Triglycerides: 61 mg/dL (ref 0–149)
VLDL Cholesterol Cal: 12 mg/dL (ref 5–40)

## 2022-04-28 ENCOUNTER — Encounter (HOSPITAL_BASED_OUTPATIENT_CLINIC_OR_DEPARTMENT_OTHER): Payer: Self-pay | Admitting: Emergency Medicine

## 2022-04-28 ENCOUNTER — Other Ambulatory Visit: Payer: Self-pay

## 2022-04-28 ENCOUNTER — Emergency Department (HOSPITAL_BASED_OUTPATIENT_CLINIC_OR_DEPARTMENT_OTHER)
Admission: EM | Admit: 2022-04-28 | Discharge: 2022-04-28 | Disposition: A | Discharge status: Home / Self Care | Payer: 59 | Attending: Emergency Medicine | Admitting: Emergency Medicine

## 2022-04-28 ENCOUNTER — Emergency Department (HOSPITAL_BASED_OUTPATIENT_CLINIC_OR_DEPARTMENT_OTHER): Payer: 59 | Admitting: Radiology

## 2022-04-28 DIAGNOSIS — R0789 Other chest pain: Secondary | ICD-10-CM | POA: Insufficient documentation

## 2022-04-28 DIAGNOSIS — R072 Precordial pain: Secondary | ICD-10-CM

## 2022-04-28 LAB — CBC
HCT: 37.4 % (ref 36.0–46.0)
Hemoglobin: 13.1 g/dL (ref 12.0–15.0)
MCH: 30.4 pg (ref 26.0–34.0)
MCHC: 35 g/dL (ref 30.0–36.0)
MCV: 86.8 fL (ref 80.0–100.0)
Platelets: 306 10*3/uL (ref 150–400)
RBC: 4.31 MIL/uL (ref 3.87–5.11)
RDW: 12 % (ref 11.5–15.5)
WBC: 7.9 10*3/uL (ref 4.0–10.5)
nRBC: 0 % (ref 0.0–0.2)

## 2022-04-28 LAB — TROPONIN I (HIGH SENSITIVITY)
Troponin I (High Sensitivity): 3 ng/L (ref <18)
Troponin I (High Sensitivity): 3 ng/L (ref <18)

## 2022-04-28 LAB — BASIC METABOLIC PANEL
Anion gap: 8 (ref 5–15)
BUN: 14 mg/dL (ref 6–20)
CO2: 26 mmol/L (ref 22–32)
Calcium: 9.5 mg/dL (ref 8.9–10.3)
Chloride: 105 mmol/L (ref 98–111)
Creatinine, Ser: 0.72 mg/dL (ref 0.44–1.00)
GFR, Estimated: >60 mL/min (ref >60)
Glucose, Bld: 90 mg/dL (ref 70–99)
Potassium: 3.7 mmol/L (ref 3.5–5.1)
Sodium: 139 mmol/L (ref 135–145)

## 2022-04-28 LAB — PREGNANCY, URINE: Preg Test, Ur: NEGATIVE

## 2022-04-28 NOTE — ED Provider Notes (Addendum)
[facility] Provider Note   CSN: [id number] Arrival date & time: 04/28/22  1845     History  Chief Complaint  Patient presents with   Chest Pain    Patricia French is a 47 y.o. female.  Patient with 1 month history of intermittent substernal chest pain.  But today at about 9 in the morning started to with a tightness in the chest and kind of continued associated with some dizziness the dizziness resolved.  No shortness of breath no nausea vomiting.  The tightness became more of a heaviness.  Improved some but has not resolved completely.  Patient denied any shortness of breath to me.  But was saying she felt a little dizzy.  Patient's oxygen saturations here 98% heart rate 76 temp 98.8 blood pressure 135/91.  Past medical history significant for hyperlipidemia.  Patient's had partial hysterectomy and cholecystectomy.  Patient is never used tobacco products.  Patient was at urgent care and they referred her in for further evaluation.  Patient without any known history of coronary artery disease.  But does have some risk factors.       Home Medications Prior to Admission medications   Medication Sig Start Date End Date Taking? Authorizing Provider  LORazepam (ATIVAN) 0.5 MG tablet Take one tablet one hour before MRI scan and another immediately before if needed.  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 Patient not taking: Reported on 04/20/2022 05/21/21   Lanae Crumbly, PA-C  oxyCODONE-acetaminophen (PERCOCET) 5-325 MG tablet Take 1 tablet by mouth every 4 (four) hours as needed for severe pain. Patient not taking: Reported on 04/20/2022 07/13/21 07/13/22  Magnant, Gerrianne Scale, PA-C  ALPRAZolam Duanne Moron) 0.5 MG tablet alprazolam 0.5 mg tablet    [provider]  Ascorbic Acid (VITAMIN C) 1000 MG tablet Take by mouth.    [provider]  celecoxib (CELEBREX) 100 MG capsule TAKE 1 CAPSULE(100 MG) BY MOUTH TWICE DAILY 08/10/21    Magnant, Charles L, PA-C  Cetirizine HCl 10 MG CAPS cetirizine 10 mg capsule  Take by oral route.    [provider]  Cyanocobalamin (VITAMIN B-12 PO) Vitamin B-12 Patient not taking: Reported on 04/20/2022    [provider]  cyclobenzaprine (FLEXERIL) 10 MG tablet Take 0.5 tablets (5 mg total) by mouth 3 (three) times daily as needed for muscle spasms. Patient not taking: Reported on 04/20/2022 02/25/22   [name], FNP  estradiol (VIVELLE-DOT) 0.075 MG/24HR Place 1 patch onto the skin daily at 6 (six) AM.    [provider]  fluticasone (FLONASE) 50 MCG/ACT nasal spray fluticasone propionate 50 mcg/actuation nasal spray,suspension Patient not taking: Reported on 04/20/2022 10/30/19   [provider]  Magnesium 250 MG TABS Take 1 tablet (250 mg total) by mouth daily. Take with evening meal Patient not taking: Reported on 04/20/2022 06/04/20   [name], FNP  promethazine-dextromethorphan (PROMETHAZINE-DM) 6.25-15 MG/5ML syrup Take 5 mLs by mouth 4 (four) times daily as needed for cough. Patient not taking: Reported on 04/20/2022 01/07/22   Perlie Mayo, NP  rosuvastatin (CRESTOR) 5 MG tablet Take 1 tablet (5 mg total) by mouth daily. 03/17/21 04/20/22  [name], FNP  Saccharomyces boulardii (PROBIOTIC) 250 MG CAPS Take 1 capsule by mouth daily.    [provider]  valACYclovir (VALTREX) 500 MG tablet Take 500 mg by mouth daily at 6 (six) AM.    [provider]  Vitamin D, Ergocalciferol, (DRISDOL) 1.25 MG (50000  UNIT) CAPS capsule Take 1 capsule by mouth daily at 6 (six) AM. 04/12/20   [provider]  vortioxetine HBr (TRINTELLIX) 5 MG TABS tablet Take 1 tablet (5 mg total) by mouth daily. 02/25/22   [name], FNP  Zinc 20 MG CAPS Take 1 capsule by mouth daily.    [provider]      Allergies    Penicillins and Pork-derived products    Review of Systems   Review of Systems  Constitutional:  Negative for  chills and fever.  HENT:  Negative for ear pain and sore throat.   Eyes:  Negative for pain and visual disturbance.  Respiratory:  Negative for cough and shortness of breath.   Cardiovascular:  Positive for chest pain. Negative for palpitations.  Gastrointestinal:  Negative for abdominal pain and vomiting.  Genitourinary:  Negative for dysuria and hematuria.  Musculoskeletal:  Negative for arthralgias and back pain.  Skin:  Negative for color change and rash.  Neurological:  Negative for seizures and syncope.  All other systems reviewed and are negative.   Physical Exam Updated Vital Signs BP (!) 141/87   Pulse 83   Temp 98.8 F (37.1 C) (Oral)   Resp 19   SpO2 99%  Physical Exam Vitals and nursing note reviewed.  Constitutional:      General: She is not in acute distress.    Appearance: Normal appearance. She is well-developed.  HENT:     Head: Normocephalic and atraumatic.  Eyes:     Extraocular Movements: Extraocular movements intact.     Conjunctiva/sclera: Conjunctivae normal.     Pupils: Pupils are equal, round, and reactive to light.  Cardiovascular:     Rate and Rhythm: Normal rate and regular rhythm.     Heart sounds: No murmur heard. Pulmonary:     Effort: Pulmonary effort is normal. No respiratory distress.     Breath sounds: Normal breath sounds.  Chest:     Chest wall: No tenderness.  Abdominal:     Palpations: Abdomen is soft.     Tenderness: There is no abdominal tenderness.  Musculoskeletal:        General: No swelling.     Cervical back: Neck supple.     Right lower leg: No edema.     Left lower leg: No edema.  Skin:    General: Skin is warm and dry.     Capillary Refill: Capillary refill takes less than 2 seconds.  Neurological:     Mental Status: She is alert.  Psychiatric:        Mood and Affect: Mood normal.     ED Results / Procedures / Treatments   Labs (all labs ordered are listed, but only abnormal results are displayed) Labs  Reviewed  BASIC METABOLIC PANEL  CBC  PREGNANCY, URINE  TROPONIN I (HIGH SENSITIVITY)  TROPONIN I (HIGH SENSITIVITY)    EKG EKG Interpretation  Date/Time:  Wednesday April 28 2022 18:54:42 EST Ventricular Rate:  73 PR Interval:  140 QRS Duration: 88 QT Interval:  376 QTC Calculation: 414 R Axis:   71 Text Interpretation: Normal sinus rhythm Normal ECG When compared with ECG of 22-Oct-2020 14:26, No significant change was found Confirmed by Fredia Sorrow [phone] on 04/28/2022 7:05:07 PM  Radiology DG Chest 2 View  Result Date: 04/28/2022 CLINICAL DATA:  Shortness of breath. EXAM: CHEST - 2 VIEW COMPARISON:  Chest radiograph dated 10/22/2020. FINDINGS: No focal consolidation, pleural effusion or pneumothorax. The cardiac silhouette is within  limits. No acute osseous pathology. IMPRESSION: No active cardiopulmonary disease. Electronically Signed   By: Anner Crete M.D.   On: 04/28/2022 19:26    Procedures Procedures    Medications Ordered in ED Medications - No data to display  ED Course/ Medical Decision Making/ A&P                             Medical Decision Making Amount and/or Complexity of Data Reviewed Labs: ordered. Radiology: ordered.  Patient's basic metabolic panel normal.  Glucose 90 renal function normal.  CBC white count 7.9 hemoglobin 13.1 platelets 306.  Pregnancy test negative initial troponin was 3.  Chest x-ray no acute findings.  Based on patient's story with things persisting throughout the day although improving patient will need delta troponin.  If negative patient can be discharged home and will make arranges for her to follow-up with cardiology.  For consideration for echocardiogram and nonexercise stress test.  Will recommend baby aspirin a day.  Patient nontoxic no acute distress.  Delta troponins without any acute changes.  Stable for discharge home.  Final Clinical Impression(s) / ED Diagnoses Final diagnoses:  Precordial pain    Rx /  DC Orders ED Discharge Orders     None         Fredia Sorrow, MD 04/28/22 2046    Fredia Sorrow, MD 04/28/22 2128

## 2022-04-28 NOTE — ED Notes (Signed)
Patient resting quietly in stretcher, respirations even, unlabored, no acute distress noted. Denies needs at this time.

## 2022-04-28 NOTE — ED Notes (Signed)
Reviewed AVS with patient, patient expressed understanding of directions, denies further questions at this time.

## 2022-04-28 NOTE — ED Notes (Signed)
MD evaluating patient.

## 2022-04-28 NOTE — Discharge Instructions (Addendum)
Make an appointment to follow-up with cardiology.  Start taking a baby aspirin a day.  Return for any new or worse symptoms.  Cardiac markers here today without any acute changes.

## 2022-04-28 NOTE — ED Triage Notes (Signed)
Pt reports chest pressure x 1 month. Pt reports the pain has been worse today and pt reports new SHOB.

## 2022-04-30 ENCOUNTER — Encounter: Payer: Self-pay | Admitting: Nurse Practitioner

## 2022-04-30 ENCOUNTER — Telehealth: Payer: Self-pay

## 2022-04-30 NOTE — Transitions of Care (Post Inpatient/ED Visit) (Signed)
   04/30/2022  Name: Patricia French MRN: [id number] DOB: 1975/11/03  Today's TOC FU Call Status: Today's TOC FU Call Status:: Unsuccessul Call (1st Attempt) Unsuccessful Call (1st Attempt) Date: 04/30/22  Attempted to reach the patient regarding the most recent Inpatient/ED visit.  Follow Up Plan: Additional outreach attempts will be made to reach the patient to complete the Transitions of Care (Post Inpatient/ED visit) call.   Signature Tami Lin.

## 2022-05-02 ENCOUNTER — Encounter: Payer: Self-pay | Admitting: Nurse Practitioner

## 2022-05-02 DIAGNOSIS — E782 Mixed hyperlipidemia: Secondary | ICD-10-CM | POA: Insufficient documentation

## 2022-05-02 DIAGNOSIS — F419 Anxiety disorder, unspecified: Secondary | ICD-10-CM | POA: Insufficient documentation

## 2022-05-06 ENCOUNTER — Encounter: Payer: Self-pay | Admitting: Nurse Practitioner

## 2022-05-07 ENCOUNTER — Encounter: Payer: Self-pay | Admitting: Nurse Practitioner

## 2022-05-07 ENCOUNTER — Other Ambulatory Visit: Payer: Self-pay | Admitting: Nurse Practitioner

## 2022-05-07 DIAGNOSIS — F419 Anxiety disorder, unspecified: Secondary | ICD-10-CM

## 2022-05-07 MED ORDER — ALPRAZOLAM 0.5 MG PO TABS: ORAL_TABLET | ORAL | 0 refills | Status: DC

## 2022-05-10 ENCOUNTER — Other Ambulatory Visit: Payer: Self-pay

## 2022-05-10 ENCOUNTER — Other Ambulatory Visit: Payer: Self-pay | Admitting: Nurse Practitioner

## 2022-05-10 DIAGNOSIS — F419 Anxiety disorder, unspecified: Secondary | ICD-10-CM

## 2022-05-10 MED ORDER — ALPRAZOLAM 0.5 MG PO TABS: 0.5000 mg | ORAL_TABLET | Freq: Two times a day (BID) | ORAL | 0 refills | Status: DC | PRN

## 2022-05-10 NOTE — Telephone Encounter (Signed)
Alprazolam sig is incomplete. Please correct and send to pharmacy.  Thanks!

## 2022-05-31 DIAGNOSIS — R1033 Periumbilical pain: Secondary | ICD-10-CM | POA: Insufficient documentation

## 2022-05-31 DIAGNOSIS — E669 Obesity, unspecified: Secondary | ICD-10-CM | POA: Insufficient documentation

## 2022-05-31 DIAGNOSIS — R300 Dysuria: Secondary | ICD-10-CM | POA: Insufficient documentation

## 2022-05-31 DIAGNOSIS — N83209 Unspecified ovarian cyst, unspecified side: Secondary | ICD-10-CM | POA: Insufficient documentation

## 2022-06-01 ENCOUNTER — Encounter: Payer: Self-pay | Admitting: Nurse Practitioner

## 2022-06-01 ENCOUNTER — Ambulatory Visit: Payer: 59 | Admitting: Nurse Practitioner

## 2022-06-01 VITALS — BP 126/78 | HR 73 | Temp 98.3°F | Ht 63.0 in | Wt 181.4 lb

## 2022-06-01 DIAGNOSIS — N951 Menopausal and female climacteric states: Secondary | ICD-10-CM | POA: Diagnosis not present

## 2022-06-01 DIAGNOSIS — F419 Anxiety disorder, unspecified: Secondary | ICD-10-CM

## 2022-06-01 DIAGNOSIS — E7800 Pure hypercholesterolemia, unspecified: Secondary | ICD-10-CM

## 2022-06-01 DIAGNOSIS — Z6832 Body mass index (BMI) 32.0-32.9, adult: Secondary | ICD-10-CM

## 2022-06-01 DIAGNOSIS — Z9189 Other specified personal risk factors, not elsewhere classified: Secondary | ICD-10-CM

## 2022-06-01 DIAGNOSIS — R6889 Other general symptoms and signs: Secondary | ICD-10-CM | POA: Insufficient documentation

## 2022-06-01 DIAGNOSIS — E6609 Other obesity due to excess calories: Secondary | ICD-10-CM

## 2022-06-01 MED ORDER — WEGOVY 0.25 MG/0.5ML ~~LOC~~ SOAJ: 0.2500 mg | SUBCUTANEOUS | 0 refills | Status: DC

## 2022-06-01 NOTE — Progress Notes (Signed)
I,Sheena H Holbrook,acting as a Neurosurgeon for Patricia Felts, FNP.,have documented all relevant documentation on the behalf of Patricia Felts, FNP,as directed by  Patricia Felts, FNP while in the presence of Patricia Felts, FNP.    Subjective:     Patient ID: Patricia French , female    DOB: [birth year] , 47 y.o.   MRN: [id number]   Chief Complaint  Patient presents with   Medical Management of Chronic Issues    HPI  Patient presents today for follow up visit. Patient was seen in ED 04/28/22 for precordial pain. She has not seen a cardiologist.   She is feeling like she is having panic attacks, smothering. She will take deep breaths. When she is at work she will feel like she is having an episode she will stand up or do something to help. She is no longer taking the trintellix and wellbutrin.   Wt Readings from Last 3 Encounters: 06/01/22 : 181 lb 6.4 oz (82.3 kg) 04/20/22 : 176 lb (79.8 kg) 02/25/22 : 182 lb (82.6 kg)  She was unable to take phentermine and she is unable to tolerate wellbutrin.       Past Medical History:  Diagnosis Date   Anxiety    HSV-2 (herpes simplex virus 2) infection    Hyperlipidemia      Family History  Problem Relation Age of Onset   Diabetes Mother    Hypertension Mother    Hyperlipidemia Mother    Heart disease Mother    Varicose Veins Mother    Anxiety disorder Mother    Heart attack Father    Bipolar disorder Father    Schizophrenia Father    Hyperlipidemia Father    Alzheimer's disease Father    Cancer Maternal Grandmother    Migraines Maternal Grandmother    Hypertension Maternal Grandmother    Alzheimer's disease Paternal Grandmother    Leukemia Paternal Grandfather    Colon cancer Neg Hx    Colon polyps Neg Hx    Esophageal cancer Neg Hx    Rectal cancer Neg Hx    Stomach cancer Neg Hx      Current Outpatient Medications:    ALPRAZolam (XANAX) 0.5 MG tablet, Take 1 tablet (0.5 mg total) by mouth 2 (two) times daily as needed for  anxiety. alprazolam 0.5 mg tablet, Disp: 30 tablet, Rfl: 0   Ascorbic Acid (VITAMIN C) 1000 MG tablet, Take by mouth., Disp: , Rfl:    celecoxib (CELEBREX) 100 MG capsule, TAKE 1 CAPSULE(100 MG) BY MOUTH TWICE DAILY, Disp: 60 capsule, Rfl: 0   Cetirizine HCl 10 MG CAPS, cetirizine 10 mg capsule  Take by oral route., Disp: , Rfl:    Cyanocobalamin (VITAMIN B-12 PO), , Disp: , Rfl:    cyclobenzaprine (FLEXERIL) 10 MG tablet, Take 0.5 tablets (5 mg total) by mouth 3 (three) times daily as needed for muscle spasms., Disp: 30 tablet, Rfl: 1   estradiol (VIVELLE-DOT) 0.075 MG/24HR, Place 1 patch onto the skin daily at 6 (six) AM., Disp: , Rfl:    fluticasone (FLONASE) 50 MCG/ACT nasal spray, , Disp: , Rfl:    Magnesium 250 MG TABS, Take 1 tablet (250 mg total) by mouth daily. Take with evening meal, Disp: 90 tablet, Rfl: 1   oxyCODONE-acetaminophen (PERCOCET) 5-325 MG tablet, Take 1 tablet by mouth every 4 (four) hours as needed for severe pain., Disp: 30 tablet, Rfl: 0   Saccharomyces boulardii (PROBIOTIC) 250 MG CAPS, Take 1 capsule by mouth daily., Disp: , Rfl:  Semaglutide-Weight Management (WEGOVY) 0.25 MG/0.5ML SOAJ, Inject 0.25 mg into the skin once a week., Disp: 2 mL, Rfl: 0   valACYclovir (VALTREX) 500 MG tablet, Take 500 mg by mouth daily at 6 (six) AM., Disp: , Rfl:    Vitamin D, Ergocalciferol, (DRISDOL) 1.25 MG (50000 UNIT) CAPS capsule, Take 1 capsule by mouth daily at 6 (six) AM., Disp: , Rfl:    vortioxetine HBr (TRINTELLIX) 5 MG TABS tablet, Take 1 tablet (5 mg total) by mouth daily., Disp: 30 tablet, Rfl: 2   Zinc 20 MG CAPS, Take 1 capsule by mouth daily., Disp: , Rfl:    progesterone (PROMETRIUM) 100 MG capsule, Take 100 mg by mouth daily., Disp: , Rfl:    rosuvastatin (CRESTOR) 5 MG tablet, Take 1 tablet (5 mg total) by mouth daily., Disp: 90 tablet, Rfl: 1   Allergies  Allergen Reactions   Penicillins Hives   Pork-Derived Products      Review of Systems  Constitutional:  Negative.   HENT: Negative.    Eyes: Negative.   Respiratory: Negative.    Cardiovascular: Negative.   Gastrointestinal: Negative.   All other systems reviewed and are negative.    Today's Vitals   06/01/22 1223  BP: 126/78  Pulse: 73  Temp: 98.3 F (36.8 C)  TempSrc: Oral  SpO2: 97%  Weight: 181 lb 6.4 oz (82.3 kg)  Height: 5\' 3"  (1.6 m)   Body mass index is 32.13 kg/m.   Objective:  Physical Exam Vitals reviewed.  Constitutional:      General: She is not in acute distress.    Appearance: Normal appearance. She is obese.  Cardiovascular:     Rate and Rhythm: Normal rate and regular rhythm.     Pulses: Normal pulses.     Heart sounds: Normal heart sounds. No murmur heard. Pulmonary:     Effort: Pulmonary effort is normal. No respiratory distress.     Breath sounds: Normal breath sounds. No wheezing.  Skin:    General: Skin is warm and dry.     Capillary Refill: Capillary refill takes less than 2 seconds.  Neurological:     General: No focal deficit present.     Mental Status: She is alert and oriented to person, place, and time.     Cranial Nerves: No cranial nerve deficit.     Motor: No weakness.  Psychiatric:        Mood and Affect: Mood normal.        Behavior: Behavior normal.        Thought Content: Thought content normal.        Judgment: Judgment normal.         Assessment And Plan:     1. Anxiety Comments: Has been having more anxiety will refer to psychiatry as she is also having focus issues. Wellbutrin caused side effects - TSH - Ambulatory referral to Psychiatry  2. Vasomotor symptoms due to menopause Comments: Encouraged to layer clothing and limit intake of high sugar and carbohydrate type foods. - TSH  3. Intolerance to heat - TSH  4. Lack of motivation Comments: Will refer to psychiatry for further evaluation. Having difficulty at work and at home - Ambulatory referral to Psychiatry  5. Elevated LDL cholesterol level Comments:  Encouraged to eat a low fat diet.  6. Class 1 obesity due to excess calories with serious comorbidity and body mass index (BMI) of 32.0 to 32.9 in adult Comments: Will see if her insurance covers Reginal LutesWegovy was  not able to tolerate phentermine as well. Will provide education once picks up from pharmacy. If you have any stomach pain or difficulty swallowing call to office denies family history of medullary thyroid cancer and pancreatitis Wegovy may cause nausea allow time for this to improve Goal to lose 1-2 lbs per week Increase your physical activity and incorporate 2 days of strength training.  - Semaglutide-Weight Management (WEGOVY) 0.25 MG/0.5ML SOAJ; Inject 0.25 mg into the skin once a week.  Dispense: 2 mL; Refill: 0     Patient was given opportunity to ask questions. Patient verbalized understanding of the plan and was able to repeat key elements of the plan. All questions were answered to their satisfaction.  Patricia FeltsJanece Alcee Sipos, FNP   I, Patricia FeltsJanece Maison Agrusa, FNP, have reviewed all documentation for this visit. The documentation on 06/01/22 for the exam, diagnosis, procedures, and orders are all accurate and complete.   IF YOU HAVE BEEN REFERRED TO A SPECIALIST, IT MAY TAKE 1-2 WEEKS TO SCHEDULE/PROCESS THE REFERRAL. IF YOU HAVE NOT HEARD FROM US/SPECIALIST IN TWO WEEKS, PLEASE GIVE US A CALL AT [phone].   THE PATIENT IS ENCOURAGED TO PRACTICE SOCIAL DISTANCING DUE TO THE COVID-19 PANDEMIC.

## 2022-06-02 LAB — TSH: TSH: 1.37 u[IU]/mL (ref 0.450–4.500)

## 2022-06-03 ENCOUNTER — Encounter: Payer: Self-pay | Admitting: Nurse Practitioner

## 2022-06-03 ENCOUNTER — Telehealth: Payer: Self-pay

## 2022-06-03 NOTE — Telephone Encounter (Signed)
Per pharmacy [facility] requires PA.  PA started via [facility] Key: [id number]

## 2022-06-07 NOTE — Telephone Encounter (Signed)
Your prior authorization request has been denied.  Your request for prior authorization was denied, but an appeal is available for your patient. For assistance, contact our support team at [phone].  Message from plan: Request Reference Number: [id number]. WEGOVY INJ 0.25MG  is denied for not meeting the prior authorization requirement(s).

## 2022-06-14 ENCOUNTER — Encounter: Payer: Self-pay | Admitting: Nurse Practitioner

## 2022-06-14 ENCOUNTER — Other Ambulatory Visit: Payer: Self-pay | Admitting: Nurse Practitioner

## 2022-06-14 DIAGNOSIS — Z6832 Body mass index (BMI) 32.0-32.9, adult: Secondary | ICD-10-CM

## 2022-06-14 MED ORDER — PHENTERMINE HCL 37.5 MG PO CAPS: 37.5000 mg | ORAL_CAPSULE | ORAL | 1 refills | Status: DC

## 2022-06-14 NOTE — Telephone Encounter (Signed)
No referral to Card in chart.  Please advise, thanks!

## 2022-06-15 ENCOUNTER — Other Ambulatory Visit: Payer: Self-pay | Admitting: Nurse Practitioner

## 2022-06-15 DIAGNOSIS — R0789 Other chest pain: Secondary | ICD-10-CM

## 2022-06-24 ENCOUNTER — Ambulatory Visit: Payer: 59 | Admitting: Nurse Practitioner

## 2022-06-24 ENCOUNTER — Encounter: Payer: Self-pay | Admitting: Nurse Practitioner

## 2022-06-24 VITALS — BP 120/78 | HR 72 | Temp 98.4°F | Ht 63.0 in | Wt 176.6 lb

## 2022-06-24 DIAGNOSIS — R197 Diarrhea, unspecified: Secondary | ICD-10-CM

## 2022-06-24 DIAGNOSIS — R109 Unspecified abdominal pain: Secondary | ICD-10-CM | POA: Diagnosis not present

## 2022-06-24 MED ORDER — DICYCLOMINE HCL 20 MG PO TABS: 20.0000 mg | ORAL_TABLET | Freq: Four times a day (QID) | ORAL | 0 refills | Status: DC

## 2022-06-24 NOTE — Progress Notes (Signed)
[name],acting as a Neurosurgeon for Arnette Felts, FNP.,have documented all relevant documentation on the behalf of Arnette Felts, FNP,as directed by  Arnette Felts, FNP while in the presence of Arnette Felts, FNP.    Subjective:     Patient ID: Patricia French , female    DOB: 1975/03/13 , 47 y.o.   MRN: [id number]   Chief Complaint  Patient presents with   GI Problem    HPI  Patient presents today for GI issues, patient reports having stomach pain after taking a herbal vitamin - lysine 1000mg  tablets. She states her stomach feels like its in knots and then she has diarrhea x 6-7 since yesterday.  Patient reports she has diarrhea a couple times of day then she is okay. Her son had diarrhea last week on Friday. She has been to diarrhea x 3 today. She has been eating chicken noodle soup, baked sweet potato. Yesterday she drank ginger ale and water. She stayed home all day yesterday. Patient does report her son being sick this week as well.   BP Readings from Last 3 Encounters: 06/24/22 : 120/78 06/01/22 : 126/78 04/28/22 : (!) 135/94    GI Problem The primary symptoms include fatigue and nausea. Primary symptoms do not include fever, vomiting or diarrhea. The onset was sudden. The problem has been gradually improving.  The illness does not include chills or back pain.     Past Medical History:  Diagnosis Date   Anxiety    HSV-2 (herpes simplex virus 2) infection    Hyperlipidemia      Family History  Problem Relation Age of Onset   Diabetes Mother    Hypertension Mother    Hyperlipidemia Mother    Heart disease Mother    Varicose Veins Mother    Anxiety disorder Mother    Heart attack Father    Bipolar disorder Father    Schizophrenia Father    Hyperlipidemia Father    Alzheimer's disease Father    Cancer Maternal Grandmother    Migraines Maternal Grandmother    Hypertension Maternal Grandmother    Alzheimer's disease Paternal Grandmother    Leukemia Paternal  Grandfather    Colon cancer Neg Hx    Colon polyps Neg Hx    Esophageal cancer Neg Hx    Rectal cancer Neg Hx    Stomach cancer Neg Hx      Current Outpatient Medications:    ALPRAZolam (XANAX) 0.5 MG tablet, Take 1 tablet (0.5 mg total) by mouth 2 (two) times daily as needed for anxiety. alprazolam 0.5 mg tablet, Disp: 30 tablet, Rfl: 0   Ascorbic Acid (VITAMIN C) 1000 MG tablet, Take by mouth., Disp: , Rfl:    celecoxib (CELEBREX) 100 MG capsule, TAKE 1 CAPSULE(100 MG) BY MOUTH TWICE DAILY, Disp: 60 capsule, Rfl: 0   Cetirizine HCl 10 MG CAPS, cetirizine 10 mg capsule  Take by oral route., Disp: , Rfl:    Cyanocobalamin (VITAMIN B-12 PO), , Disp: , Rfl:    cyclobenzaprine (FLEXERIL) 10 MG tablet, Take 0.5 tablets (5 mg total) by mouth 3 (three) times daily as needed for muscle spasms., Disp: 30 tablet, Rfl: 1   dicyclomine (BENTYL) 20 MG tablet, Take 1 tablet (20 mg total) by mouth every 6 (six) hours., Disp: 20 tablet, Rfl: 0   estradiol (VIVELLE-DOT) 0.075 MG/24HR, Place 1 patch onto the skin daily at 6 (six) AM., Disp: , Rfl:    fluticasone (FLONASE) 50 MCG/ACT nasal spray, , Disp: , Rfl:  Magnesium 250 MG TABS, Take 1 tablet (250 mg total) by mouth daily. Take with evening meal, Disp: 90 tablet, Rfl: 1   oxyCODONE-acetaminophen (PERCOCET) 5-325 MG tablet, Take 1 tablet by mouth every 4 (four) hours as needed for severe pain., Disp: 30 tablet, Rfl: 0   phentermine 37.5 MG capsule, Take 1 capsule (37.5 mg total) by mouth every morning., Disp: 30 capsule, Rfl: 1   progesterone (PROMETRIUM) 100 MG capsule, Take 100 mg by mouth daily., Disp: , Rfl:    rosuvastatin (CRESTOR) 5 MG tablet, Take 1 tablet (5 mg total) by mouth daily., Disp: 90 tablet, Rfl: 1   Saccharomyces boulardii (PROBIOTIC) 250 MG CAPS, Take 1 capsule by mouth daily., Disp: , Rfl:    valACYclovir (VALTREX) 500 MG tablet, Take 500 mg by mouth daily at 6 (six) AM., Disp: , Rfl:    Vitamin D, Ergocalciferol, (DRISDOL) 1.25  MG (50000 UNIT) CAPS capsule, Take 1 capsule by mouth daily at 6 (six) AM., Disp: , Rfl:    vortioxetine HBr (TRINTELLIX) 5 MG TABS tablet, Take 1 tablet (5 mg total) by mouth daily., Disp: 30 tablet, Rfl: 2   Zinc 20 MG CAPS, Take 1 capsule by mouth daily., Disp: , Rfl:    Allergies  Allergen Reactions   Penicillins Hives   Pork-Derived Products      Review of Systems  Constitutional:  Positive for fatigue. Negative for chills and fever.  Respiratory: Negative.    Cardiovascular: Negative.   Gastrointestinal:  Positive for nausea. Negative for diarrhea and vomiting.  Musculoskeletal:  Negative for back pain.     Today's Vitals   06/24/22 1517  BP: 120/78  Pulse: 72  Temp: 98.4 F (36.9 C)  TempSrc: Oral  Weight: 176 lb 9.6 oz (80.1 kg)  Height: 5\' 3"  (1.6 m)  PainSc: 0-No pain   Body mass index is 31.28 kg/m.  Wt Readings from Last 3 Encounters:  06/24/22 176 lb 9.6 oz (80.1 kg)  06/01/22 181 lb 6.4 oz (82.3 kg)  04/20/22 176 lb (79.8 kg)    Objective:  Physical Exam Vitals reviewed.  Constitutional:      General: She is not in acute distress.    Appearance: Normal appearance. She is obese.  Cardiovascular:     Rate and Rhythm: Normal rate and regular rhythm.     Pulses: Normal pulses.     Heart sounds: Normal heart sounds. No murmur heard. Pulmonary:     Effort: Pulmonary effort is normal. No respiratory distress.     Breath sounds: Normal breath sounds. No wheezing.  Skin:    General: Skin is warm and dry.     Capillary Refill: Capillary refill takes less than 2 seconds.  Neurological:     General: No focal deficit present.     Mental Status: She is alert and oriented to person, place, and time.     Cranial Nerves: No cranial nerve deficit.     Motor: No weakness.  Psychiatric:        Mood and Affect: Mood normal.        Behavior: Behavior normal.        Thought Content: Thought content normal.        Judgment: Judgment normal.         Assessment  And Plan:     1. Stomach pain Comments: Will treat with bentyl, advised to eat a bland diet and stay well hydrated with water . - dicyclomine (BENTYL) 20 MG tablet; Take 1  tablet (20 mg total) by mouth every 6 (six) hours.  Dispense: 20 tablet; Refill: 0  2. Diarrhea, unspecified type Comments: Diarrhea is improving, I did give stool kit to check for any viruses/bacteria. - Culture, Stool - Clostridium difficile Toxin A/B - Ova and parasite examination     Patient was given opportunity to ask questions. Patient verbalized understanding of the plan and was able to repeat key elements of the plan. All questions were answered to their satisfaction.  Arnette Felts, FNP   I, Arnette Felts, FNP, have reviewed all documentation for this visit. The documentation on 06/24/22 for the exam, diagnosis, procedures, and orders are all accurate and complete.   IF YOU HAVE BEEN REFERRED TO A SPECIALIST, IT MAY TAKE 1-2 WEEKS TO SCHEDULE/PROCESS THE REFERRAL. IF YOU HAVE NOT HEARD FROM US/SPECIALIST IN TWO WEEKS, PLEASE GIVE [place] A CALL AT [phone].   THE PATIENT IS ENCOURAGED TO PRACTICE SOCIAL DISTANCING DUE TO THE COVID-19 PANDEMIC.

## 2022-06-24 NOTE — Patient Instructions (Signed)
Diarrhea, Adult Diarrhea is when you pass loose and sometimes watery poop (stool) often. Diarrhea can make you feel weak and cause you to lose water in your body (get dehydrated). Losing water in your body can cause you to: Feel tired and thirsty. Have a dry mouth. Go pee (urinate) less often. Diarrhea often lasts 2-3 days. It can last longer if it is a sign of something more serious. Be sure to treat your diarrhea as told by your doctor. Follow these instructions at home: Eating and drinking     Follow these instructions as told by your doctor: Take an ORS (oral rehydration solution). This is a drink that helps you replace fluids and minerals your body lost. It is sold at pharmacies and stores. Drink enough fluid to keep your pee (urine) pale yellow. Drink fluids such as: Water. You can also get fluids by sucking on ice chips. Diluted fruit juice. Low-calorie sports drinks. Milk. Avoid drinking fluids that have a lot of sugar or caffeine in them. These include soda, energy drinks, and regular sports drinks. Avoid alcohol. Eat bland, easy-to-digest foods in small amounts as you are able. These foods include: Bananas. Applesauce. Rice. Low-fat (lean) meats. Toast. Crackers. Avoid spicy or fatty foods.  Medicines Take over-the-counter and prescription medicines only as told by your doctor. If you were prescribed antibiotics, take them as told by your doctor. Do not stop taking them even if you start to feel better. General instructions  Wash your hands often using soap and water for 20 seconds. If soap and water are not available, use hand sanitizer. Others in your home should wash their hands as well. Wash your hands: After using the toilet or changing a diaper. Before preparing, cooking, or serving food. While caring for a sick person. While visiting someone in a hospital. Rest at home while you get better. Take a warm bath to help with any burning or pain from having  diarrhea. Watch your condition for any changes. Contact a doctor if: You have a fever. Your diarrhea gets worse. You have new symptoms. You vomit every time you eat or drink. You feel light-headed, dizzy, or you have a headache. You have muscle cramps. You have signs of losing too much water in your body, such as: Dark pee, very little pee, or no pee. Cracked lips. Dry mouth. Sunken eyes. Sleepiness. Weakness. You have bloody or black poop or poop that looks like tar. You have very bad pain, cramping, or bloating in your belly (abdomen). Your skin feels cold and clammy. You feel confused. Get help right away if: You have chest pain. Your heart is beating very quickly. You have trouble breathing or you are breathing very quickly. You feel very weak or you faint. These symptoms may be an emergency. Get help right away. Call 911. Do not wait to see if the symptoms will go away. Do not drive yourself to the hospital. This information is not intended to replace advice given to you by your health care provider. Make sure you discuss any questions you have with your health care provider. Document Revised: 07/28/2021 Document Reviewed: 07/28/2021 Elsevier Patient Education  2023 [employer]  

## 2022-06-28 ENCOUNTER — Encounter: Payer: Self-pay | Admitting: Nurse Practitioner

## 2022-06-29 ENCOUNTER — Ambulatory Visit: Payer: Self-pay | Admitting: Nurse Practitioner

## 2022-07-01 ENCOUNTER — Other Ambulatory Visit: Payer: Self-pay

## 2022-07-01 ENCOUNTER — Encounter: Payer: Self-pay | Admitting: Nurse Practitioner

## 2022-07-01 DIAGNOSIS — E6609 Other obesity due to excess calories: Secondary | ICD-10-CM

## 2022-07-01 MED ORDER — PHENTERMINE HCL 37.5 MG PO CAPS: 37.5000 mg | ORAL_CAPSULE | ORAL | 1 refills | Status: DC

## 2022-07-15 ENCOUNTER — Ambulatory Visit (INDEPENDENT_AMBULATORY_CARE_PROVIDER_SITE_OTHER): Payer: 59 | Admitting: Nurse Practitioner

## 2022-07-15 ENCOUNTER — Encounter: Payer: Self-pay | Admitting: Nurse Practitioner

## 2022-07-15 VITALS — BP 120/80 | HR 98 | Temp 98.5°F | Ht 63.0 in | Wt 179.0 lb

## 2022-07-15 DIAGNOSIS — Z6831 Body mass index (BMI) 31.0-31.9, adult: Secondary | ICD-10-CM

## 2022-07-15 DIAGNOSIS — Z79899 Other long term (current) drug therapy: Secondary | ICD-10-CM

## 2022-07-15 DIAGNOSIS — F330 Major depressive disorder, recurrent, mild: Secondary | ICD-10-CM | POA: Insufficient documentation

## 2022-07-15 DIAGNOSIS — Z2821 Immunization not carried out because of patient refusal: Secondary | ICD-10-CM

## 2022-07-15 DIAGNOSIS — E559 Vitamin D deficiency, unspecified: Secondary | ICD-10-CM | POA: Diagnosis not present

## 2022-07-15 DIAGNOSIS — F419 Anxiety disorder, unspecified: Secondary | ICD-10-CM | POA: Diagnosis not present

## 2022-07-15 DIAGNOSIS — Z0000 Encounter for general adult medical examination without abnormal findings: Secondary | ICD-10-CM

## 2022-07-15 DIAGNOSIS — E782 Mixed hyperlipidemia: Secondary | ICD-10-CM | POA: Diagnosis not present

## 2022-07-15 DIAGNOSIS — E6609 Other obesity due to excess calories: Secondary | ICD-10-CM

## 2022-07-15 DIAGNOSIS — Z13228 Encounter for screening for other metabolic disorders: Secondary | ICD-10-CM

## 2022-07-15 NOTE — Progress Notes (Signed)
Patricia French,acting as a Neurosurgeon for Patricia Felts, FNP.,have documented all relevant documentation on the behalf of Patricia Felts, FNP,as directed by  Patricia Felts, FNP while in the presence of Patricia Felts, FNP.   Subjective:     Patient ID: Patricia French , female    DOB: 01-03-1976 , 47 y.o.   MRN: [id number]   Chief Complaint  Patient presents with   Annual Exam    HPI  Patient presents today for HM, patient reports compliance with medications and has no other concerns today.  BP Readings from Last 3 Encounters: 07/15/22 : 120/80 06/24/22 : 120/78 06/01/22 : 126/78  Wt Readings from Last 3 Encounters: 07/15/22 : 179 lb (81.2 kg) 06/24/22 : 176 lb 9.6 oz (80.1 kg) 06/01/22 : 181 lb 6.4 oz (82.3 kg)  Referral to cardiology and psychiatry has been made and she has an appt with both set up.  Reports a history of vitamin d deficiency and has had to take a vitamin d supplement.  She has been taking an over the counter vitamin d occasionally.       Past Medical History:  Diagnosis Date   Allergy In childhood   Anxiety    Arthritis 2023   Depression On and off for more than 10 years   GERD (gastroesophageal reflux disease) More than 10 years ago   Heart murmur N/A   HSV-2 (herpes simplex virus 2) infection    Hyperlipidemia      Family History  Problem Relation Age of Onset   Diabetes Mother    Hypertension Mother    Hyperlipidemia Mother    Heart disease Mother    Varicose Veins Mother    Anxiety disorder Mother    Arthritis Mother    Heart attack Father    Bipolar disorder Father    Schizophrenia Father    Hyperlipidemia Father    Alzheimer's disease Father    Cancer Maternal Grandmother    Migraines Maternal Grandmother    Hypertension Maternal Grandmother    Alzheimer's disease Paternal Grandmother    Leukemia Paternal Grandfather    Colon cancer Neg Hx    Colon polyps Neg Hx    Esophageal cancer Neg Hx    Rectal cancer Neg Hx    Stomach cancer  Neg Hx      Current Outpatient Medications:    ALPRAZolam (XANAX) 0.5 MG tablet, Take 1 tablet (0.5 mg total) by mouth 2 (two) times daily as needed for anxiety. alprazolam 0.5 mg tablet, Disp: 30 tablet, Rfl: 0   Ascorbic Acid (VITAMIN C) 1000 MG tablet, Take by mouth., Disp: , Rfl:    Cetirizine HCl 10 MG CAPS, cetirizine 10 mg capsule  Take by oral route., Disp: , Rfl:    Cyanocobalamin (VITAMIN B-12 PO), , Disp: , Rfl:    estradiol (VIVELLE-DOT) 0.075 MG/24HR, Place 1 patch onto the skin daily at 6 (six) AM., Disp: , Rfl:    fluticasone (FLONASE) 50 MCG/ACT nasal spray, , Disp: , Rfl:    Magnesium 250 MG TABS, Take 1 tablet (250 mg total) by mouth daily. Take with evening meal, Disp: 90 tablet, Rfl: 1   phentermine 37.5 MG capsule, Take 1 capsule (37.5 mg total) by mouth every morning., Disp: 30 capsule, Rfl: 1   progesterone (PROMETRIUM) 100 MG capsule, Take 100 mg by mouth daily., Disp: , Rfl:    valACYclovir (VALTREX) 500 MG tablet, Take 500 mg by mouth daily at 6 (six) AM., Disp: , Rfl:  Vitamin D, Ergocalciferol, (DRISDOL) 1.25 MG (50000 UNIT) CAPS capsule, Take 1 capsule by mouth daily at 6 (six) AM., Disp: , Rfl:    Zinc 20 MG CAPS, Take 1 capsule by mouth daily., Disp: , Rfl:    rosuvastatin (CRESTOR) 5 MG tablet, Take 1 tablet (5 mg total) by mouth daily., Disp: 90 tablet, Rfl: 1   Allergies  Allergen Reactions   Penicillins Hives   Pork-Derived Products       The patient states she is status post hysterectomy since 2014 due to heavy menstrual cycles. Continues to get PAPs with Henreitta Leber. No LMP recorded. Patient has had a hysterectomy.  Negative for: breast discharge, breast lump(s), breast pain and breast self exam. Associated symptoms include abnormal vaginal bleeding. Pertinent negatives include abnormal bleeding (hematology), anxiety, decreased libido, depression, difficulty falling sleep, dyspareunia, history of infertility, nocturia, sexual dysfunction, sleep  disturbances, urinary incontinence, urinary urgency, vaginal discharge and vaginal itching. Diet regular - she has been trying to cut back on her portion sizes. She is taking phentermine. The patient states her exercise level is minimal - not regularly. She does [place] stepping once a week for about 3 hours.   The patient's tobacco use is:  Social History   Tobacco Use  Smoking Status Never  Smokeless Tobacco Never  Tobacco Comments   Never used   She has been exposed to passive smoke. The patient's alcohol use is:  Social History   Substance and Sexual Activity  Alcohol Use Yes   Comment: Socially   Will get copy of records to see last PAP  Review of Systems  Constitutional: Negative.   HENT: Negative.    Eyes: Negative.   Respiratory: Negative.    Cardiovascular: Negative.   Gastrointestinal: Negative.   Endocrine: Negative.   Genitourinary: Negative.   Musculoskeletal: Negative.   Skin: Negative.   Allergic/Immunologic: Negative.   Neurological: Negative.   Hematological: Negative.   Psychiatric/Behavioral: Negative.       Today's Vitals   07/15/22 0848  BP: 120/80  Pulse: 98  Temp: 98.5 F (36.9 C)  TempSrc: Oral  Weight: 179 lb (81.2 kg)  Height: 5\' 3"  (1.6 m)  PainSc: 0-No pain   Body mass index is 31.71 kg/m.  Wt Readings from Last 3 Encounters:  07/15/22 179 lb (81.2 kg)  06/24/22 176 lb 9.6 oz (80.1 kg)  06/01/22 181 lb 6.4 oz (82.3 kg)    Objective:  Physical Exam Vitals reviewed.  Constitutional:      General: She is not in acute distress.    Appearance: Normal appearance. She is well-developed. She is obese.  HENT:     Head: Normocephalic and atraumatic.     Right Ear: Hearing, tympanic membrane, ear canal and external ear normal. There is no impacted cerumen.     Left Ear: Hearing, tympanic membrane, ear canal and external ear normal. There is no impacted cerumen.     Nose: Nose normal.     Mouth/Throat:     Mouth: Mucous membranes are  moist.  Eyes:     General: Lids are normal.     Extraocular Movements: Extraocular movements intact.     Conjunctiva/sclera: Conjunctivae normal.     Pupils: Pupils are equal, round, and reactive to light.     Funduscopic exam:    Right eye: No papilledema.        Left eye: No papilledema.  Neck:     Thyroid: No thyroid mass.     Vascular: No  carotid bruit.  Cardiovascular:     Rate and Rhythm: Normal rate and regular rhythm.     Pulses: Normal pulses.     Heart sounds: Normal heart sounds. No murmur heard. Pulmonary:     Effort: Pulmonary effort is normal. No respiratory distress.     Breath sounds: Normal breath sounds. No wheezing.  Chest:     Chest wall: No mass.  Breasts:    Tanner Score is 5.     Right: Normal. No mass or tenderness.     Left: Normal. No mass or tenderness.  Abdominal:     General: Abdomen is flat. Bowel sounds are normal. There is no distension.     Palpations: Abdomen is soft.     Tenderness: There is no abdominal tenderness.  Musculoskeletal:        General: No swelling. Normal range of motion.     Cervical back: Full passive range of motion without pain, normal range of motion and neck supple.     Right lower leg: No edema.     Left lower leg: No edema.  Lymphadenopathy:     Upper Body:     Right upper body: No supraclavicular, axillary or pectoral adenopathy.     Left upper body: No supraclavicular, axillary or pectoral adenopathy.  Skin:    General: Skin is warm and dry.     Capillary Refill: Capillary refill takes less than 2 seconds.  Neurological:     General: No focal deficit present.     Mental Status: She is alert and oriented to person, place, and time.     Cranial Nerves: No cranial nerve deficit.     Sensory: No sensory deficit.     Motor: No weakness.  Psychiatric:        Mood and Affect: Mood normal.        Behavior: Behavior normal.        Thought Content: Thought content normal.        Judgment: Judgment normal.          Assessment And Plan:     1. Encounter for annual health examination Behavior modifications discussed and diet history reviewed.   Pt will continue to exercise regularly and modify diet with low GI, plant based foods and decrease intake of processed foods.  Recommend intake of daily multivitamin, Vitamin D, and calcium.  Recommend mammogram and colonoscopy for preventive screenings, as well as recommend immunizations that include influenza, TDAP Health Maintenance  Topic Date Due   Pap Smear  Never done   COVID-19 Vaccine (5 - 2023-24 season) 07/31/2022*   Flu Shot  09/23/2022   Colon Cancer Screening  01/02/2031   Hepatitis C Screening  Completed   HIV Screening  Completed   HPV Vaccine  Aged Out   DTaP/Tdap/Td vaccine  Discontinued  *Topic was postponed. The date shown is not the original due date.   2. Anxiety Comments: She has an appt with psychiatry set up.  3. Mixed hyperlipidemia Comments: Will check lipid panel. Continue focusing on diet low in fat. - CMP14+EGFR - Lipid panel  4. Mild episode of recurrent major depressive disorder (HCC) Comments: Continue current medications, she has an appt with psychiatry  5. Vitamin D deficiency Comments: She has a previous history of vitamin d deficiency and has taken over the counter medications. Will check vitamin d. - VITAMIN D 25 Hydroxy (Vit-D Deficiency, Fractures)  6. Class 1 obesity due to excess calories without serious comorbidity with body mass  index (BMI) of 31.0 to 31.9 in adult Comments: continue phentermine - Hemoglobin A1c  7. COVID-19 vaccination declined Declines covid 19 vaccine. Discussed risk of covid 5 and if she changes her mind about the vaccine to call the office. Education has been provided regarding the importance of this vaccine but patient still declined. Advised may receive this vaccine at local pharmacy or Health Dept.or vaccine clinic. Aware to provide a copy of the vaccination record if obtained  from local pharmacy or Health Dept.  Encouraged to take multivitamin, vitamin d, vitamin c and zinc to increase immune system. Aware can call office if would like to have vaccine here at office. Verbalized acceptance and understanding.  8. Encounter for screening for metabolic disorder - Hemoglobin A1c  9. Other long term (current) drug therapy - CBC with Differential/Platelet    No follow-ups on file. Patient was given opportunity to ask questions. Patient verbalized understanding of the plan and was able to repeat key elements of the plan. All questions were answered to their satisfaction.   Patricia Felts, FNP   I, Patricia Felts, FNP, have reviewed all documentation for this visit. The documentation on 07/15/22 for the exam, diagnosis, procedures, and orders are all accurate and complete.   THE PATIENT IS ENCOURAGED TO PRACTICE SOCIAL DISTANCING DUE TO THE COVID-19 PANDEMIC.

## 2022-07-16 LAB — CBC WITH DIFFERENTIAL/PLATELET
Basophils Absolute: 0 10*3/uL (ref 0.0–0.2)
Basos: 0 %
EOS (ABSOLUTE): 0.1 10*3/uL (ref 0.0–0.4)
Eos: 1 %
Hematocrit: 40.1 % (ref 34.0–46.6)
Hemoglobin: 13.9 g/dL (ref 11.1–15.9)
Immature Grans (Abs): 0 10*3/uL (ref 0.0–0.1)
Immature Granulocytes: 1 %
Lymphocytes Absolute: 2.1 10*3/uL (ref 0.7–3.1)
Lymphs: 33 %
MCH: 30.4 pg (ref 26.6–33.0)
MCHC: 34.7 g/dL (ref 31.5–35.7)
MCV: 88 fL (ref 79–97)
Monocytes Absolute: 0.4 10*3/uL (ref 0.1–0.9)
Monocytes: 6 %
Neutrophils Absolute: 3.8 10*3/uL (ref 1.4–7.0)
Neutrophils: 59 %
Platelets: 311 10*3/uL (ref 150–450)
RBC: 4.57 x10E6/uL (ref 3.77–5.28)
RDW: 11.9 % (ref 11.7–15.4)
WBC: 6.4 10*3/uL (ref 3.4–10.8)

## 2022-07-16 LAB — CMP14+EGFR
ALT: 16 IU/L (ref 0–32)
AST: 20 IU/L (ref 0–40)
Albumin/Globulin Ratio: 2.1 (ref 1.2–2.2)
Albumin: 4.6 g/dL (ref 3.9–4.9)
Alkaline Phosphatase: 84 IU/L (ref 44–121)
BUN/Creatinine Ratio: 18 (ref 9–23)
BUN: 12 mg/dL (ref 6–24)
Bilirubin Total: 0.7 mg/dL (ref 0.0–1.2)
CO2: 23 mmol/L (ref 20–29)
Calcium: 10 mg/dL (ref 8.7–10.2)
Chloride: 102 mmol/L (ref 96–106)
Creatinine, Ser: 0.66 mg/dL (ref 0.57–1.00)
Globulin, Total: 2.2 g/dL (ref 1.5–4.5)
Glucose: 88 mg/dL (ref 70–99)
Potassium: 4.3 mmol/L (ref 3.5–5.2)
Sodium: 140 mmol/L (ref 134–144)
Total Protein: 6.8 g/dL (ref 6.0–8.5)
eGFR: 108 mL/min/{1.73_m2} (ref >59)

## 2022-07-16 LAB — LIPID PANEL
Chol/HDL Ratio: 3.6 ratio (ref 0.0–4.4)
Cholesterol, Total: 196 mg/dL (ref 100–199)
HDL: 54 mg/dL (ref >39)
LDL Chol Calc (NIH): 131 mg/dL — ABNORMAL HIGH (ref 0–99)
Triglycerides: 59 mg/dL (ref 0–149)
VLDL Cholesterol Cal: 11 mg/dL (ref 5–40)

## 2022-07-16 LAB — VITAMIN D 25 HYDROXY (VIT D DEFICIENCY, FRACTURES): Vit D, 25-Hydroxy: 20.4 ng/mL — ABNORMAL LOW (ref 30.0–100.0)

## 2022-07-16 LAB — HEMOGLOBIN A1C
Est. average glucose Bld gHb Est-mCnc: 111 mg/dL
Hgb A1c MFr Bld: 5.5 % (ref 4.8–5.6)

## 2022-07-20 ENCOUNTER — Ambulatory Visit: Payer: Self-pay | Admitting: Adult Health

## 2022-07-22 MED ORDER — VITAMIN D (ERGOCALCIFEROL) 1.25 MG (50000 UNIT) PO CAPS: ORAL_CAPSULE | ORAL | 1 refills | Status: DC

## 2022-07-26 ENCOUNTER — Other Ambulatory Visit: Payer: Self-pay | Admitting: Nurse Practitioner

## 2022-07-26 ENCOUNTER — Encounter: Payer: Self-pay | Admitting: Nurse Practitioner

## 2022-07-26 DIAGNOSIS — E782 Mixed hyperlipidemia: Secondary | ICD-10-CM

## 2022-07-27 ENCOUNTER — Other Ambulatory Visit: Payer: Self-pay

## 2022-07-27 DIAGNOSIS — E782 Mixed hyperlipidemia: Secondary | ICD-10-CM

## 2022-07-27 MED ORDER — ROSUVASTATIN CALCIUM 5 MG PO TABS: 5.0000 mg | ORAL_TABLET | Freq: Every day | ORAL | 2 refills | Status: DC

## 2022-07-30 ENCOUNTER — Encounter (HOSPITAL_BASED_OUTPATIENT_CLINIC_OR_DEPARTMENT_OTHER): Payer: Self-pay | Admitting: Cardiology

## 2022-07-30 ENCOUNTER — Ambulatory Visit (HOSPITAL_BASED_OUTPATIENT_CLINIC_OR_DEPARTMENT_OTHER): Payer: 59 | Admitting: Cardiology

## 2022-07-30 VITALS — BP 136/88 | HR 78 | Ht 63.5 in | Wt 179.2 lb

## 2022-07-30 DIAGNOSIS — Z6831 Body mass index (BMI) 31.0-31.9, adult: Secondary | ICD-10-CM

## 2022-07-30 DIAGNOSIS — Z7189 Other specified counseling: Secondary | ICD-10-CM

## 2022-07-30 DIAGNOSIS — Z8249 Family history of ischemic heart disease and other diseases of the circulatory system: Secondary | ICD-10-CM

## 2022-07-30 DIAGNOSIS — E6609 Other obesity due to excess calories: Secondary | ICD-10-CM

## 2022-07-30 NOTE — Patient Instructions (Signed)
Medication Instructions:  Your physician recommends that you continue on your current medications as directed. Please refer to the Current Medication list given to you today.  *If you need a refill on your cardiac medications before your next appointment, please call your pharmacy*  Follow-Up: At [facility], you and your health needs are our priority.  As part of our continuing mission to provide you with exceptional heart care, we have created designated Provider Care Teams.  These Care Teams include your primary Cardiologist (physician) and Advanced Practice Providers (APPs -  Physician Assistants and Nurse Practitioners) who all work together to provide you with the care you need, when you need it.  We recommend signing up for the patient portal called "MyChart".  Sign up information is provided on this After Visit Summary.  MyChart is used to connect with patients for Virtual Visits (Telemedicine).  Patients are able to view lab/test results, encounter notes, upcoming appointments, etc.  Non-urgent messages can be sent to your provider as well.   To learn more about what you can do with MyChart, go to [email or web address].    Your next appointment:   2 year(s)  Provider:   Jodelle Red, MD

## 2022-07-30 NOTE — Progress Notes (Signed)
Cardiology Office Note:    Date:  07/30/2022   ID:  Patricia French, DOB 1975/12/31, MRN [id number]  PCP:  Arnette Felts, FNP  Cardiologist:  Jodelle Red, MD  Referring MD: Arnette Felts, FNP   CC: new patient consultation for chest pain, CV risk  History of Present Illness:    Patricia French is a 47 y.o. female with a hx of obesity (now on phentermine), rosuvastatin who is seen as a new consult at the request of Arnette Felts, FNP for the evaluation and management of chest pain.  Reviewed notes from Arnette Felts and ER.  Went to ER 04/28/22 with chest pain. Workup unremarkable. Trintellix stopped at that time, no further chest discomfort.  Father had MI late 47s, passed age 51. No heart issues prior. Mother had heart disease, unclear etiology. Had 4V CABG, carotid endarterectomy. Never had MI. Had stress tests after her CABG, never told there were any issues. Live to be 57 years old. Sister, no known heart issues. (18 years older).  No personal history of heart issues. Discussed prevention at length today.   Denies chest pain, shortness of breath at rest or with normal exertion. No PND, orthopnea, LE edema or unexpected weight gain. No syncope or palpitations.   Past Medical History:  Diagnosis Date   Allergy In childhood   Anxiety    Arthritis 2023   Depression On and off for more than 10 years   GERD (gastroesophageal reflux disease) More than 10 years ago   Heart murmur N/A   HSV-2 (herpes simplex virus 2) infection    Hyperlipidemia     Past Surgical History:  Procedure Laterality Date   ABDOMINAL HYSTERECTOMY  2015   CESAREAN SECTION     CHOLECYSTECTOMY     COSMETIC SURGERY  2015 tummy tuck   EYE SURGERY  More than 15 years ago   keloid removal     PARTIAL HYSTERECTOMY     TONSILLECTOMY     TUBAL LIGATION  2012   tummy tuck      Current Medications: Current Outpatient Medications on File Prior to Visit  Medication Sig   ALPRAZolam (XANAX) 0.5 MG  tablet Take 1 tablet (0.5 mg total) by mouth 2 (two) times daily as needed for anxiety. alprazolam 0.5 mg tablet   Ascorbic Acid (VITAMIN C) 1000 MG tablet Take by mouth.   Cetirizine HCl 10 MG CAPS cetirizine 10 mg capsule  Take by oral route.   Cyanocobalamin (VITAMIN B-12 PO)    estradiol (VIVELLE-DOT) 0.075 MG/24HR Place 1 patch onto the skin daily at 6 (six) AM.   fluticasone (FLONASE) 50 MCG/ACT nasal spray    Lysine 1000 MG TABS Take 1,000 mg by mouth daily.   Magnesium 250 MG TABS Take 1 tablet (250 mg total) by mouth daily. Take with evening meal   phentermine 37.5 MG capsule Take 1 capsule (37.5 mg total) by mouth every morning.   progesterone (PROMETRIUM) 100 MG capsule Take 100 mg by mouth daily.   rosuvastatin (CRESTOR) 5 MG tablet Take 1 tablet (5 mg total) by mouth daily.   valACYclovir (VALTREX) 500 MG tablet Take 500 mg by mouth daily at 6 (six) AM.   Vitamin D, Ergocalciferol, (DRISDOL) 1.25 MG (50000 UNIT) CAPS capsule Take 1 capsule by mouth once a week   Zinc 20 MG CAPS Take 1 capsule by mouth daily.   No current facility-administered medications on file prior to visit.     Allergies:   Penicillins and Pork-derived products  Social History   Tobacco Use   Smoking status: Never   Smokeless tobacco: Never   Tobacco comments:    Never used  Vaping Use   Vaping Use: Never used  Substance Use Topics   Alcohol use: Yes    Comment: Socially   Drug use: No    Family History: family history includes Alzheimer's disease in her father and paternal grandmother; Anxiety disorder in her mother; Arthritis in her mother; Bipolar disorder in her father; Cancer in her maternal grandmother; Diabetes in her mother; Heart attack in her father; Heart disease in her mother; Hyperlipidemia in her father and mother; Hypertension in her maternal grandmother and mother; Leukemia in her paternal grandfather; Migraines in her maternal grandmother; Schizophrenia in her father; Varicose  Veins in her mother. There is no history of Colon cancer, Colon polyps, Esophageal cancer, Rectal cancer, or Stomach cancer.  ROS:   Please see the history of present illness.  Additional pertinent ROS: Constitutional: Negative for chills, fever, night sweats, unintentional weight loss  HENT: Negative for ear pain and hearing loss.   Eyes: Negative for loss of vision and eye pain.  Respiratory: Negative for cough, sputum, wheezing.   Cardiovascular: See HPI. Gastrointestinal: Negative for abdominal pain, melena, and hematochezia.  Genitourinary: Negative for dysuria and hematuria.  Musculoskeletal: Negative for falls and myalgias.  Skin: Negative for itching and rash.  Neurological: Negative for focal weakness, focal sensory changes and loss of consciousness.  Endo/Heme/Allergies: Does not bruise/bleed easily.     EKGs/Labs/Other Studies Reviewed:    The following studies were reviewed today: No prior cardiac studies  EKG:  EKG is personally reviewed.   07/30/22: NSR 78 bpm  Recent Labs: 06/01/2022: TSH 1.370 07/15/2022: ALT 16; BUN 12; Creatinine, Ser 0.66; Hemoglobin 13.9; Platelets 311; Potassium 4.3; Sodium 140  Recent Lipid Panel    Component Value Date/Time   CHOL 196 07/15/2022 0927   TRIG 59 07/15/2022 0927   HDL 54 07/15/2022 0927   CHOLHDL 3.6 07/15/2022 0927   LDLCALC 131 (H) 07/15/2022 0927    Physical Exam:    VS:  BP 136/88   Pulse 78   Ht 5' 3.5" (1.613 m)   Wt 179 lb 3.2 oz (81.3 kg)   SpO2 98%   BMI 31.25 kg/m     Wt Readings from Last 3 Encounters:  07/30/22 179 lb 3.2 oz (81.3 kg)  07/15/22 179 lb (81.2 kg)  06/24/22 176 lb 9.6 oz (80.1 kg)    GEN: Well nourished, well developed in no acute distress HEENT: Normal, moist mucous membranes NECK: No JVD CARDIAC: regular rhythm, normal S1 and S2, no rubs or gallops. No murmur. VASCULAR: Radial and DP pulses 2+ bilaterally. No carotid bruits RESPIRATORY:  Clear to auscultation without rales, wheezing  or rhonchi  ABDOMEN: Soft, non-tender, non-distended MUSCULOSKELETAL:  Ambulates independently SKIN: Warm and dry, no edema NEUROLOGIC:  Alert and oriented x 3. No focal neuro deficits noted. PSYCHIATRIC:  Normal affect    ASSESSMENT:    1. Family history of heart disease   2. Class 1 obesity due to excess calories without serious comorbidity with body mass index (BMI) of 31.0 to 31.9 in adult   3. Cardiac risk counseling   4. Counseling on health promotion and disease prevention    PLAN:    Obesity Family history of heart disease -already on rosuvastatin, last LDL 131. Would like to increase to 10 mg and recheck, but would wait until on stable regimen otherwise. Once  medications stable, increase rosuvastatin to 10 mg and recheck lipids 3 mos after -seeing lotus infusion next week to discuss GLP1RA -currently on phentermine without issues. Would not like this to be a long term med but ok in the short term  Cardiac risk counseling and prevention recommendations: -recommend heart healthy/Mediterranean diet, with whole grains, fruits, vegetable, fish, lean meats, nuts, and olive oil. Limit salt. -recommend moderate walking, 3-5 times/week for 30-50 minutes each session. Aim for at least 150 minutes.week. Goal should be pace of 3 miles/hours, or walking 1.5 miles in 30 minutes -recommend avoidance of tobacco products. Avoid excess alcohol. -ASCVD risk score: The 10-year ASCVD risk score (Arnett DK, et al., 2019) is: 2%   Values used to calculate the score:     Age: 58 years     Sex: Female     Is Non-Hispanic African American: Yes     Diabetic: No     Tobacco smoker: No     Systolic Blood Pressure: 136 mmHg     Is BP treated: No     HDL Cholesterol: 54 mg/dL     Total Cholesterol: 196 mg/dL    Plan for follow up: 2 years or sooner as needed  Jodelle Red, MD, PhD, [facility] [place]  [facility]  [place]  [facility] [address], [address] [place], [place] [address] [phone]   Medication Adjustments/Labs and Tests Ordered: Current medicines are reviewed at length with the patient today.  Concerns regarding medicines are outlined above.  Orders Placed This Encounter  Procedures   EKG 12-Lead   No orders of the defined types were placed in this encounter.   Patient Instructions  Medication Instructions:  Your physician recommends that you continue on your current medications as directed. Please refer to the Current Medication list given to you today.  *If you need a refill on your cardiac medications before your next appointment, please call your pharmacy*  Follow-Up: At [facility], you and your health needs are our priority.  As part of our continuing mission to provide you with exceptional heart care, we have created designated Provider Care Teams.  These Care Teams include your primary Cardiologist (physician) and Advanced Practice Providers (APPs -  Physician Assistants and Nurse Practitioners) who all work together to provide you with the care you need, when you need it.  We recommend signing up for the patient portal called "MyChart".  Sign up information is provided on this After Visit Summary.  MyChart is used to connect with patients for Virtual Visits (Telemedicine).  Patients are able to view lab/test results, encounter notes, upcoming appointments, etc.  Non-urgent messages can be sent to your provider as well.   To learn more about what you can do with MyChart, go to [email or web address].    Your next appointment:   2 year(s)  Provider:   Jodelle Red, MD      Signed, Jodelle Red, MD PhD 07/30/2022 5:30 PM     [facility]

## 2022-08-09 ENCOUNTER — Ambulatory Visit: Payer: Self-pay | Admitting: Adult Health

## 2022-09-02 ENCOUNTER — Other Ambulatory Visit: Payer: Self-pay | Admitting: Obstetrics and Gynecology

## 2022-09-02 DIAGNOSIS — N6489 Other specified disorders of breast: Secondary | ICD-10-CM

## 2022-09-03 ENCOUNTER — Encounter: Payer: Self-pay | Admitting: Adult Health

## 2022-09-03 ENCOUNTER — Ambulatory Visit: Payer: 59 | Admitting: Adult Health

## 2022-09-03 VITALS — BP 137/83 | HR 70 | Ht 64.0 in | Wt 174.0 lb

## 2022-09-03 DIAGNOSIS — F331 Major depressive disorder, recurrent, moderate: Secondary | ICD-10-CM

## 2022-09-03 DIAGNOSIS — F419 Anxiety disorder, unspecified: Secondary | ICD-10-CM

## 2022-09-03 DIAGNOSIS — F411 Generalized anxiety disorder: Secondary | ICD-10-CM | POA: Diagnosis not present

## 2022-09-03 DIAGNOSIS — F410 Panic disorder [episodic paroxysmal anxiety] without agoraphobia: Secondary | ICD-10-CM

## 2022-09-03 DIAGNOSIS — F4310 Post-traumatic stress disorder, unspecified: Secondary | ICD-10-CM | POA: Diagnosis not present

## 2022-09-03 MED ORDER — ALPRAZOLAM 0.5 MG PO TABS: 0.5000 mg | ORAL_TABLET | Freq: Two times a day (BID) | ORAL | 0 refills | Status: DC | PRN

## 2022-09-03 MED ORDER — FLUOXETINE HCL 10 MG PO CAPS: 10.0000 mg | ORAL_CAPSULE | Freq: Every day | ORAL | 2 refills | Status: DC

## 2022-09-03 NOTE — Progress Notes (Signed)
[facility] MD/PA/NP Initial Note  09/03/2022 11:15 AM Patricia French  MRN:  [id number]  Chief Complaint:   HPI:   Patient seen today for initial psychiatric evaluation.   Referred by PCP Arnette Felts - PCP.  Describes mood today as - Mood symptoms - reports depression - "it comes and goes". Mother passed away from pancreatic cancer earlier this year. Reports anxiety "a lot of days". Reports irritability. Feels "angry a lot".  Reports panic attacks - "choking can't breath - throat closing". Reports worry, rumination and over thinking. Reports increased worry of recent mammogram findings. Reports physical manifestation to mood changes. Feels overwhelmed at times. Mood is variable - "depends on what is going on in my life". Stating "I feel like I need some help". Reports working with her PCP and has tried a few medications without success. Willing to consider options. Stable interest and motivation. Taking medications as prescribed.  Energy levels stable. Active, has a regular exercise routine.  Enjoys some usual interests and activities. Divorced. Married 4 times. Has 3 children - 3 grandchildren. Spending time with family. Appetite adequate. Weight loss - 6 pounds - taking a semaglutide agent. Sleeps better some nights than others - TMJ wears mouth guard. Wakes up at 4:30 a lot of mornings.  Averages 6 hours during the week and longer on the weekends. Focus and concentration difficulties at times. Therapist feels like she may have ADD symptoms. Completing tasks. Managing aspects of household. Works full time for [employer].  Denies SI or HI. Reports a suicide attempt in high school - over dosed on tylenol. Denies AH or VH. Denies self harm. Denies substance use.  Has worked work a Paramedic  Previous medication trials:  Xanax, Lorazepam, Wellbutrin, Trintellix  Visit Diagnosis:    ICD-10-CM   1. Major depressive disorder, recurrent episode, moderate (HCC)  F33.1     2.  Generalized anxiety disorder  F41.1 FLUoxetine (PROZAC) 10 MG capsule    3. Panic attacks  F41.0 FLUoxetine (PROZAC) 10 MG capsule    4. PTSD (post-traumatic stress disorder)  F43.10 FLUoxetine (PROZAC) 10 MG capsule    5. Anxiety  F41.9 ALPRAZolam (XANAX) 0.5 MG tablet      Past Psychiatric History: Denies psychiatric hospitalization.   Past Medical History:  Past Medical History:  Diagnosis Date   Allergy In childhood   Anxiety    Arthritis 2023   Depression On and off for more than 10 years   GERD (gastroesophageal reflux disease) More than 10 years ago   Heart murmur N/A   HSV-2 (herpes simplex virus 2) infection    Hyperlipidemia     Past Surgical History:  Procedure Laterality Date   ABDOMINAL HYSTERECTOMY  2015   CESAREAN SECTION     CHOLECYSTECTOMY     COSMETIC SURGERY  2015 tummy tuck   EYE SURGERY  More than 15 years ago   keloid removal     PARTIAL HYSTERECTOMY     TONSILLECTOMY     TUBAL LIGATION  2012   tummy tuck      Family Psychiatric History: Family history of mental illness - father BPD1 - schizophrenia  Family History:  Family History  Problem Relation Age of Onset   Diabetes Mother    Hypertension Mother    Hyperlipidemia Mother    Heart disease Mother    Varicose Veins Mother    Anxiety disorder Mother    Arthritis Mother    Heart attack Father    Bipolar disorder  Father    Schizophrenia Father    Hyperlipidemia Father    Alzheimer's disease Father    Cancer Maternal Grandmother    Migraines Maternal Grandmother    Hypertension Maternal Grandmother    Alzheimer's disease Paternal Grandmother    Leukemia Paternal Grandfather    Colon cancer Neg Hx    Colon polyps Neg Hx    Esophageal cancer Neg Hx    Rectal cancer Neg Hx    Stomach cancer Neg Hx     Social History:  Social History   Socioeconomic History   Marital status: Divorced    Spouse name: Not on file   Number of children: Not on file   Years of education: Not on  file   Highest education level: Not on file  Occupational History   Not on file  Tobacco Use   Smoking status: Never   Smokeless tobacco: Never   Tobacco comments:    Never used  Vaping Use   Vaping status: Never Used  Substance and Sexual Activity   Alcohol use: Yes    Comment: Socially   Drug use: No   Sexual activity: Yes    Birth control/protection: Condom  Other Topics Concern   Not on file  Social History Narrative   Not on file   Social Determinants of Health   Financial Resource Strain: Not on file  Food Insecurity: Not on file  Transportation Needs: Not on file  Physical Activity: Not on file  Stress: Not on file  Social Connections: Not on file    Allergies:  Allergies  Allergen Reactions   Penicillins Hives   Pork-Derived Products     Metabolic Disorder Labs: Lab Results  Component Value Date   HGBA1C 5.5 07/15/2022   No results found for: "PROLACTIN" Lab Results  Component Value Date   CHOL 196 07/15/2022   TRIG 59 07/15/2022   HDL 54 07/15/2022   CHOLHDL 3.6 07/15/2022   LDLCALC 131 (H) 07/15/2022   LDLCALC 103 (H) 04/26/2022   Lab Results  Component Value Date   TSH 1.370 06/01/2022    Therapeutic Level Labs: No results found for: "LITHIUM" No results found for: "VALPROATE" No results found for: "CBMZ"  Current Medications: Current Outpatient Medications  Medication Sig Dispense Refill   FLUoxetine (PROZAC) 10 MG capsule Take 1 capsule (10 mg total) by mouth daily. 30 capsule 2   ALPRAZolam (XANAX) 0.5 MG tablet Take 1 tablet (0.5 mg total) by mouth 2 (two) times daily as needed for anxiety. alprazolam 0.5 mg tablet 30 tablet 0   Ascorbic Acid (VITAMIN C) 1000 MG tablet Take by mouth.     Cetirizine HCl 10 MG CAPS cetirizine 10 mg capsule  Take by oral route.     Cyanocobalamin (VITAMIN B-12 PO)      estradiol (VIVELLE-DOT) 0.075 MG/24HR Place 1 patch onto the skin daily at 6 (six) AM.     fluticasone (FLONASE) 50 MCG/ACT nasal  spray      Lysine 1000 MG TABS Take 1,000 mg by mouth daily.     Magnesium 250 MG TABS Take 1 tablet (250 mg total) by mouth daily. Take with evening meal 90 tablet 1   phentermine 37.5 MG capsule Take 1 capsule (37.5 mg total) by mouth every morning. 30 capsule 1   progesterone (PROMETRIUM) 100 MG capsule Take 100 mg by mouth daily.     rosuvastatin (CRESTOR) 5 MG tablet Take 1 tablet (5 mg total) by mouth daily. 90 tablet 2  valACYclovir (VALTREX) 500 MG tablet Take 500 mg by mouth daily at 6 (six) AM.     Vitamin D, Ergocalciferol, (DRISDOL) 1.25 MG (50000 UNIT) CAPS capsule Take 1 capsule by mouth once a week 12 capsule 1   Zinc 20 MG CAPS Take 1 capsule by mouth daily.     No current facility-administered medications for this visit.    Medication Side Effects: none  Orders placed this visit:  No orders of the defined types were placed in this encounter.   Psychiatric Specialty Exam:  Review of Systems  Musculoskeletal:  Negative for gait problem.  Neurological:  Negative for tremors.  Psychiatric/Behavioral:         Please refer to HPI    Blood pressure 137/83, pulse 70, height 5\' 4"  (1.626 m), weight 174 lb (78.9 kg).Body mass index is 29.87 kg/m.  General Appearance: Casual, Neat, and Well Groomed  Eye Contact:  Good  Speech:  Clear and Coherent and Normal Rate  Volume:  Normal  Mood:  Anxious, Depressed, and Irritable  Affect:  Appropriate and Congruent  Thought Process:  Coherent and Descriptions of Associations: Intact  Orientation:  Full (Time, Place, and Person)  Thought Content: Logical   Suicidal Thoughts:  No  Homicidal Thoughts:  No  Memory:  WNL  Judgement:  Good  Insight:  Good  Psychomotor Activity:  Normal  Concentration:  Concentration: Good and Attention Span: Good  Recall:  Good  Fund of Knowledge: Good  Language: Good  Assets:  Communication Skills Desire for Improvement Financial Resources/Insurance Housing Intimacy Leisure Time Physical  Health Resilience Social Support Talents/Skills Transportation Vocational/Educational  ADL's:  Intact  Cognition: WNL  Prognosis:  Good   Screenings:  GAD-7    Flowsheet Row Office Visit from 07/15/2022 in [facility] Office Visit from 06/01/2022 in [facility]  Total GAD-7 Score 4 7      PHQ2-9    Flowsheet Row Office Visit from 07/15/2022 in [facility] Office Visit from 06/01/2022 in [facility] Office Visit from 02/25/2022 in [facility] Office Visit from 06/04/2020 in [facility]  PHQ-2 Total Score 2 3 1  0  PHQ-9 Total Score 2 8 9  --      Flowsheet Row ED from 04/28/2022 in [facility] at [facility]  C-SSRS RISK CATEGORY No Risk       Receiving Psychotherapy: No   Treatment Plan/Recommendations:   Plan:  PDMP reviewed  Add Prozac 10mg  daily for mood symptoms.  RTC 4 weeks  Patient advised to contact office with any questions, adverse effects, or acute worsening in signs and symptoms.      Dorothyann Gibbs, NP

## 2022-09-08 ENCOUNTER — Other Ambulatory Visit: Payer: Self-pay | Admitting: Obstetrics and Gynecology

## 2022-09-08 ENCOUNTER — Ambulatory Visit: Admission: RE | Admit: 2022-09-08 | Payer: 59 | Source: Ambulatory Visit

## 2022-09-08 ENCOUNTER — Ambulatory Visit
Admission: RE | Admit: 2022-09-08 | Discharge: 2022-09-08 | Disposition: A | Discharge status: Home / Self Care | Payer: 59 | Source: Ambulatory Visit | Attending: Obstetrics and Gynecology | Admitting: Obstetrics and Gynecology

## 2022-09-08 DIAGNOSIS — N6489 Other specified disorders of breast: Secondary | ICD-10-CM

## 2022-09-10 ENCOUNTER — Ambulatory Visit
Admission: RE | Admit: 2022-09-10 | Discharge: 2022-09-10 | Disposition: A | Discharge status: Home / Self Care | Payer: 59 | Source: Ambulatory Visit | Attending: Obstetrics and Gynecology | Admitting: Obstetrics and Gynecology

## 2022-09-10 DIAGNOSIS — N6489 Other specified disorders of breast: Secondary | ICD-10-CM

## 2022-09-10 HISTORY — PX: BREAST BIOPSY: SHX20

## 2022-10-01 ENCOUNTER — Ambulatory Visit (INDEPENDENT_AMBULATORY_CARE_PROVIDER_SITE_OTHER): Payer: 59 | Admitting: Adult Health

## 2022-10-01 ENCOUNTER — Encounter: Payer: Self-pay | Admitting: Adult Health

## 2022-10-01 DIAGNOSIS — F4310 Post-traumatic stress disorder, unspecified: Secondary | ICD-10-CM | POA: Diagnosis not present

## 2022-10-01 DIAGNOSIS — F329 Major depressive disorder, single episode, unspecified: Secondary | ICD-10-CM | POA: Diagnosis not present

## 2022-10-01 DIAGNOSIS — F411 Generalized anxiety disorder: Secondary | ICD-10-CM

## 2022-10-01 DIAGNOSIS — F410 Panic disorder [episodic paroxysmal anxiety] without agoraphobia: Secondary | ICD-10-CM

## 2022-10-01 MED ORDER — FLUOXETINE HCL 10 MG PO CAPS
10.0000 mg | ORAL_CAPSULE | Freq: Every day | ORAL | 1 refills | Status: DC
Start: 2022-10-01 — End: 2023-04-15

## 2022-10-01 NOTE — Progress Notes (Signed)
Patricia French [id number] 06-30-1975 47 y.o.  Subjective:   Patient ID:  Patricia French is a 47 y.o. (DOB 07-12-1975) female.  Chief Complaint: No chief complaint on file.   HPI Patricia French presents to the office today for follow-up of MDD, GAD, PTSD, panic attacks,   Referred by PCP Arnette Felts - PCP.  Describes mood today as "ok". Pleasant. Denies tearfulness. Mood symptoms - reports decreased anxiety and depression and irritability. Reports reduced panic attacks. Reports some worry, rumination and over thinking - "part of who I am". Mood is more consistent. Stating "I feel good".  Feels like addition of Prozac has been helpful. Improved interest and motivation. Taking medications as prescribed.  Energy levels stable. Active, has a regular exercise routine.  Enjoys some usual interests and activities. Divorced. Married 4 times. Has 3 children - 3 grandchildren. Spending time with family. Appetite adequate. Weight loss - 1 to 1.5 pounds a week - taking a semaglutide agent. Sleeping better at night - TMJ wears mouth guard. Wakes up at 4:30 a lot of mornings. Averages 7 hours during the week and longer on the weekends. Focus and concentration difficulties at times. Completing tasks. Managing aspects of household. Works full time for [employer].  Denies SI or HI. Reports a suicide attempt in high school - over dosed on tylenol. Denies AH or VH. Denies self harm. Denies substance use.  Has worked work a Paramedic  Previous medication trials:  Xanax, Lorazepam, Wellbutrin, Trintellix  GAD-7    Flowsheet Row Office Visit from 07/15/2022 in [facility] Office Visit from 06/01/2022 in [facility]  Total GAD-7 Score 4 7      PHQ2-9    Flowsheet Row Office Visit from 07/15/2022 in [facility] Office Visit from 06/01/2022 in [facility]  Office Visit from 02/25/2022 in [facility] Office Visit from 06/04/2020 in [facility]  PHQ-2 Total Score 2 3 1  0  PHQ-9 Total Score 2 8 9  --      Flowsheet Row ED from 04/28/2022 in [facility] Emergency Department at [facility]  C-SSRS RISK CATEGORY No Risk        Review of Systems:  Review of Systems  Musculoskeletal:  Negative for gait problem.  Neurological:  Negative for tremors.  Psychiatric/Behavioral:         Please refer to HPI    Medications: I have reviewed the patient's current medications.  Current Outpatient Medications  Medication Sig Dispense Refill   ALPRAZolam (XANAX) 0.5 MG tablet Take 1 tablet (0.5 mg total) by mouth 2 (two) times daily as needed for anxiety. alprazolam 0.5 mg tablet 30 tablet 0   Ascorbic Acid (VITAMIN C) 1000 MG tablet Take by mouth.     Cetirizine HCl 10 MG CAPS cetirizine 10 mg capsule  Take by oral route.     Cyanocobalamin (VITAMIN B-12 PO)      estradiol (VIVELLE-DOT) 0.075 MG/24HR Place 1 patch onto the skin daily at 6 (six) AM.     FLUoxetine (PROZAC) 10 MG capsule Take 1 capsule (10 mg total) by mouth daily. 30 capsule 2   fluticasone (FLONASE) 50 MCG/ACT nasal spray      Lysine 1000 MG TABS Take 1,000 mg by mouth daily.     Magnesium 250 MG TABS Take 1 tablet (250 mg total) by mouth daily. Take with evening meal  90 tablet 1   phentermine 37.5 MG capsule Take 1 capsule (37.5 mg total) by mouth every morning. 30 capsule 1   progesterone (PROMETRIUM) 100 MG capsule Take 100 mg by mouth daily.     rosuvastatin (CRESTOR) 5 MG tablet Take 1 tablet (5 mg total) by mouth daily. 90 tablet 2   valACYclovir (VALTREX) 500 MG tablet Take 500 mg by mouth daily at 6 (six) AM.     Vitamin D, Ergocalciferol, (DRISDOL) 1.25 MG (50000 UNIT) CAPS capsule Take 1 capsule by mouth once a week 12 capsule 1   Zinc 20 MG CAPS Take 1 capsule by mouth daily.     No current  facility-administered medications for this visit.    Medication Side Effects: None  Allergies:  Allergies  Allergen Reactions   Penicillins Hives   Pork-Derived Products     Past Medical History:  Diagnosis Date   Allergy In childhood   Anxiety    Arthritis 2023   Depression On and off for more than 10 years   GERD (gastroesophageal reflux disease) More than 10 years ago   Heart murmur N/A   HSV-2 (herpes simplex virus 2) infection    Hyperlipidemia     Past Medical History, Surgical history, Social history, and Family history were reviewed and updated as appropriate.   Please see review of systems for further details on the patient's review from today.   Objective:   Physical Exam:  There were no vitals taken for this visit.  Physical Exam Constitutional:      General: She is not in acute distress. Musculoskeletal:        General: No deformity.  Neurological:     Mental Status: She is alert and oriented to person, place, and time.     Coordination: Coordination normal.  Psychiatric:        Attention and Perception: Attention and perception normal. She does not perceive auditory or visual hallucinations.        Mood and Affect: Affect is not labile, blunt, angry or inappropriate.        Speech: Speech normal.        Behavior: Behavior normal.        Thought Content: Thought content normal. Thought content is not paranoid or delusional. Thought content does not include homicidal or suicidal ideation. Thought content does not include homicidal or suicidal plan.        Cognition and Memory: Cognition and memory normal.        Judgment: Judgment normal.     Comments: Insight intact     Lab Review:     Component Value Date/Time   NA 140 07/15/2022 0927   K 4.3 07/15/2022 0927   CL 102 07/15/2022 0927   CO2 23 07/15/2022 0927   GLUCOSE 88 07/15/2022 0927   GLUCOSE 90 04/28/2022 1854   BUN 12 07/15/2022 0927   CREATININE 0.66 07/15/2022 0927   CALCIUM 10.0  07/15/2022 0927   PROT 6.8 07/15/2022 0927   ALBUMIN 4.6 07/15/2022 0927   AST 20 07/15/2022 0927   ALT 16 07/15/2022 0927   ALKPHOS 84 07/15/2022 0927   BILITOT 0.7 07/15/2022 0927   GFRNONAA >60 04/28/2022 1854       Component Value Date/Time   WBC 6.4 07/15/2022 0927   WBC 7.9 04/28/2022 1854   RBC 4.57 07/15/2022 0927   RBC 4.31 04/28/2022 1854   HGB 13.9 07/15/2022 0927   HCT 40.1 07/15/2022 0927   PLT 311 07/15/2022  0927   MCV 88 07/15/2022 0927   MCH 30.4 07/15/2022 0927   MCH 30.4 04/28/2022 1854   MCHC 34.7 07/15/2022 0927   MCHC 35.0 04/28/2022 1854   RDW 11.9 07/15/2022 0927   LYMPHSABS 2.1 07/15/2022 0927   MONOABS 0.7 10/22/2020 1631   EOSABS 0.1 07/15/2022 0927   BASOSABS 0.0 07/15/2022 0927    No results found for: "POCLITH", "LITHIUM"   No results found for: "PHENYTOIN", "PHENOBARB", "VALPROATE", "CBMZ"   .res Assessment: Plan:    Plan:  PDMP reviewed  Prozac 10mg  daily for mood symptoms.  RTC 3 months  Patient advised to contact office with any questions, adverse effects, or acute worsening in signs and symptoms.   There are no diagnoses linked to this encounter.   Please see After Visit Summary for patient specific instructions.  Future Appointments  Date Time Provider Department Center  10/01/2022  4:20 PM Rjay Revolorio, Thereasa Solo, NP [facility] None  10/19/2022  8:20 AM Arnette Felts, FNP [facility] None  07/25/2023  8:40 AM Arnette Felts, FNP [facility] None    No orders of the defined types were placed in this encounter.   -------------------------------

## 2022-10-14 ENCOUNTER — Encounter: Payer: Self-pay | Admitting: Nurse Practitioner

## 2022-10-19 ENCOUNTER — Ambulatory Visit: Payer: Self-pay | Admitting: Nurse Practitioner

## 2022-10-20 ENCOUNTER — Ambulatory Visit: Payer: Self-pay | Admitting: Nurse Practitioner

## 2022-10-28 ENCOUNTER — Encounter: Payer: Self-pay | Admitting: Family Medicine

## 2022-10-28 ENCOUNTER — Ambulatory Visit: Payer: 59 | Admitting: Family Medicine

## 2022-10-28 VITALS — BP 122/80 | HR 71 | Temp 98.7°F | Ht 64.0 in | Wt 169.0 lb

## 2022-10-28 DIAGNOSIS — E559 Vitamin D deficiency, unspecified: Secondary | ICD-10-CM

## 2022-10-28 DIAGNOSIS — E782 Mixed hyperlipidemia: Secondary | ICD-10-CM

## 2022-10-28 DIAGNOSIS — Z2821 Immunization not carried out because of patient refusal: Secondary | ICD-10-CM

## 2022-10-28 DIAGNOSIS — F419 Anxiety disorder, unspecified: Secondary | ICD-10-CM | POA: Diagnosis not present

## 2022-10-28 NOTE — Assessment & Plan Note (Signed)
Check labs 

## 2022-10-28 NOTE — Progress Notes (Signed)
I,Jameka J Llittleton, CMA,acting as a Neurosurgeon for [employer], NP.,have documented all relevant documentation on the behalf of [name], NP,as directed by  [name], NP while in the presence of [name], NP.  Subjective:  Patient ID: Patricia French , female    DOB: 08/23/1975 , 47 y.o.   MRN: [id number]  Chief Complaint  Patient presents with   Hyperlipidemia    HPI  Patient presents today for hyperlipidemia management. Patient reports compliance with her meds, crestor 5mg  every day. Patient states that she was recently started on Prozac 10mg  every day for anxiety by R. Mozingo, NP and it has helped her greatly, relieving her anxiety symptoms.   Hyperlipidemia This is a recurrent problem. The problem is controlled. Recent lipid tests were reviewed and are high. Current antihyperlipidemic treatment includes statins. There are no compliance problems.  Risk factors for coronary artery disease include dyslipidemia.     Past Medical History:  Diagnosis Date   Allergy In childhood   Anxiety    Arthritis 2023   Depression On and off for more than 10 years   GERD (gastroesophageal reflux disease) More than 10 years ago   Heart murmur N/A   HSV-2 (herpes simplex virus 2) infection    Hyperlipidemia      Family History  Problem Relation Age of Onset   Diabetes Mother    Hypertension Mother    Hyperlipidemia Mother    Heart disease Mother    Varicose Veins Mother    Anxiety disorder Mother    Arthritis Mother    Heart attack Father    Bipolar disorder Father    Schizophrenia Father    Hyperlipidemia Father    Alzheimer's disease Father    Cancer Maternal Grandmother    Migraines Maternal Grandmother    Hypertension Maternal Grandmother    Alzheimer's disease Paternal Grandmother    Leukemia Paternal Grandfather    Colon cancer Neg Hx    Colon polyps Neg Hx    Esophageal cancer Neg Hx    Rectal cancer Neg Hx    Stomach cancer Neg Hx      Current Outpatient  Medications:    ALPRAZolam (XANAX) 0.5 MG tablet, Take 1 tablet (0.5 mg total) by mouth 2 (two) times daily as needed for anxiety. alprazolam 0.5 mg tablet, Disp: 30 tablet, Rfl: 0   Ascorbic Acid (VITAMIN C) 1000 MG tablet, Take by mouth., Disp: , Rfl:    Cetirizine HCl 10 MG CAPS, cetirizine 10 mg capsule  Take by oral route., Disp: , Rfl:    Cyanocobalamin (VITAMIN B-12 PO), , Disp: , Rfl:    estradiol (VIVELLE-DOT) 0.075 MG/24HR, Place 1 patch onto the skin daily at 6 (six) AM., Disp: , Rfl:    FLUoxetine (PROZAC) 10 MG capsule, Take 1 capsule (10 mg total) by mouth daily., Disp: 90 capsule, Rfl: 1   fluticasone (FLONASE) 50 MCG/ACT nasal spray, , Disp: , Rfl:    Lysine 1000 MG TABS, Take 1,000 mg by mouth daily., Disp: , Rfl:    Magnesium 250 MG TABS, Take 1 tablet (250 mg total) by mouth daily. Take with evening meal, Disp: 90 tablet, Rfl: 1   phentermine 37.5 MG capsule, Take 1 capsule (37.5 mg total) by mouth every morning., Disp: 30 capsule, Rfl: 1   progesterone (PROMETRIUM) 100 MG capsule, Take 100 mg by mouth daily., Disp: , Rfl:    rosuvastatin (CRESTOR) 5 MG tablet, Take 1 tablet (5 mg total) by mouth daily., Disp:  90 tablet, Rfl: 2   valACYclovir (VALTREX) 500 MG tablet, Take 500 mg by mouth daily at 6 (six) AM., Disp: , Rfl:    Vitamin D, Ergocalciferol, (DRISDOL) 1.25 MG (50000 UNIT) CAPS capsule, Take 1 capsule by mouth once a week, Disp: 12 capsule, Rfl: 1   Zinc 20 MG CAPS, Take 1 capsule by mouth daily., Disp: , Rfl:    Allergies  Allergen Reactions   Penicillins Hives   Pork-Derived Products      Review of Systems  Constitutional: Negative.   HENT: Negative.    Eyes: Negative.   Respiratory: Negative.    Musculoskeletal: Negative.   Skin: Negative.   Psychiatric/Behavioral:  The patient is nervous/anxious.      Today's Vitals   10/28/22 0832  BP: 122/80  Pulse: 71  Temp: 98.7 F (37.1 C)  Weight: 169 lb (76.7 kg)  Height: 5\' 4"  (1.626 m)  PainSc: 0-No  pain   Body mass index is 29.01 kg/m.  Wt Readings from Last 3 Encounters:  10/28/22 169 lb (76.7 kg)  07/30/22 179 lb 3.2 oz (81.3 kg)  07/15/22 179 lb (81.2 kg)    The 10-year ASCVD risk score (Arnett DK, et al., 2019) is: 1.4%   Values used to calculate the score:     Age: 50 years     Sex: Female     Is Non-Hispanic African American: Yes     Diabetic: No     Tobacco smoker: No     Systolic Blood Pressure: 122 mmHg     Is BP treated: No     HDL Cholesterol: 54 mg/dL     Total Cholesterol: 196 mg/dL  Objective:  Physical Exam Constitutional:      Appearance: Normal appearance.  Cardiovascular:     Rate and Rhythm: Normal rate and regular rhythm.     Pulses: Normal pulses.     Heart sounds: Normal heart sounds.  Pulmonary:     Effort: Pulmonary effort is normal.     Breath sounds: Normal breath sounds.  Abdominal:     General: Bowel sounds are normal.  Neurological:     Mental Status: She is alert.  Psychiatric:        Mood and Affect: Mood normal.        Behavior: Behavior normal.         Assessment And Plan:  Mixed hyperlipidemia Assessment & Plan: Chronic.Low fat diet advised, continue current treatment plan , crestor 5mg  every day.  Orders: -     CBC -     Basic metabolic panel -     Lipid panel  Vitamin D deficiency Assessment & Plan: Check labs.  Orders: -     VITAMIN D 25 Hydroxy (Vit-D Deficiency, Fractures)  Anxiety Assessment & Plan: Improved since starting Prozac 10mg  every day.   Influenza vaccination declined    Return for Keep next Appt.  Patient was given opportunity to ask questions. Patient verbalized understanding of the plan and was able to repeat key elements of the plan. All questions were answered to their satisfaction.    I, [name], NP, have reviewed all documentation for this visit. The documentation on 10/28/22 for the exam, diagnosis, procedures, and orders are all accurate and complete.   IF YOU HAVE BEEN  REFERRED TO A SPECIALIST, IT MAY TAKE 1-2 WEEKS TO SCHEDULE/PROCESS THE REFERRAL. IF YOU HAVE NOT HEARD FROM US/SPECIALIST IN TWO WEEKS, PLEASE GIVE [place] A CALL AT [phone].

## 2022-10-28 NOTE — Assessment & Plan Note (Signed)
Improved since starting Prozac 10mg  every day.

## 2022-10-28 NOTE — Assessment & Plan Note (Signed)
Chronic.Low fat diet advised, continue current treatment plan , crestor 5mg  every day.

## 2022-10-29 LAB — BASIC METABOLIC PANEL
BUN/Creatinine Ratio: 18 (ref 9–23)
BUN: 13 mg/dL (ref 6–24)
CO2: 23 mmol/L (ref 20–29)
Calcium: 10.1 mg/dL (ref 8.7–10.2)
Chloride: 104 mmol/L (ref 96–106)
Creatinine, Ser: 0.73 mg/dL (ref 0.57–1.00)
Glucose: 78 mg/dL (ref 70–99)
Potassium: 4.8 mmol/L (ref 3.5–5.2)
Sodium: 142 mmol/L (ref 134–144)
eGFR: 101 mL/min/{1.73_m2} (ref >59)

## 2022-10-29 LAB — LIPID PANEL
Chol/HDL Ratio: 3.4 ratio (ref 0.0–4.4)
Cholesterol, Total: 172 mg/dL (ref 100–199)
HDL: 51 mg/dL (ref >39)
LDL Chol Calc (NIH): 109 mg/dL — ABNORMAL HIGH (ref 0–99)
Triglycerides: 60 mg/dL (ref 0–149)
VLDL Cholesterol Cal: 12 mg/dL (ref 5–40)

## 2022-10-29 LAB — CBC
Hematocrit: 40.3 % (ref 34.0–46.6)
Hemoglobin: 13.6 g/dL (ref 11.1–15.9)
MCH: 29.2 pg (ref 26.6–33.0)
MCHC: 33.7 g/dL (ref 31.5–35.7)
MCV: 87 fL (ref 79–97)
Platelets: 332 10*3/uL (ref 150–450)
RBC: 4.65 x10E6/uL (ref 3.77–5.28)
RDW: 11.9 % (ref 11.7–15.4)
WBC: 6.3 10*3/uL (ref 3.4–10.8)

## 2022-10-29 LAB — VITAMIN D 25 HYDROXY (VIT D DEFICIENCY, FRACTURES): Vit D, 25-Hydroxy: 43.4 ng/mL (ref 30.0–100.0)

## 2022-11-05 ENCOUNTER — Encounter: Payer: Self-pay | Admitting: Family Medicine

## 2023-01-12 ENCOUNTER — Ambulatory Visit: Payer: 59 | Admitting: Family Medicine

## 2023-01-12 ENCOUNTER — Encounter: Payer: Self-pay | Admitting: Family Medicine

## 2023-01-12 ENCOUNTER — Encounter: Payer: Self-pay | Admitting: Nurse Practitioner

## 2023-01-12 VITALS — BP 110/70 | HR 76 | Temp 98.4°F | Ht 64.0 in | Wt 166.0 lb

## 2023-01-12 DIAGNOSIS — R051 Acute cough: Secondary | ICD-10-CM

## 2023-01-12 DIAGNOSIS — H6690 Otitis media, unspecified, unspecified ear: Secondary | ICD-10-CM

## 2023-01-12 DIAGNOSIS — J069 Acute upper respiratory infection, unspecified: Secondary | ICD-10-CM

## 2023-01-12 DIAGNOSIS — H9203 Otalgia, bilateral: Secondary | ICD-10-CM

## 2023-01-12 MED ORDER — BENZONATATE 100 MG PO CAPS: 100.0000 mg | ORAL_CAPSULE | Freq: Three times a day (TID) | ORAL | 1 refills | Status: DC | PRN

## 2023-01-12 MED ORDER — HYDROCORTISONE-ACETIC ACID 1-2 % OT SOLN: 2.0000 [drp] | Freq: Three times a day (TID) | OTIC | 0 refills | Status: AC

## 2023-01-12 MED ORDER — TRIAMCINOLONE ACETONIDE 40 MG/ML IJ SUSP
60.0000 mg | Freq: Once | INTRAMUSCULAR | Status: AC
Start: 2023-01-12 — End: 2023-01-12
  Administered 2023-01-12: 60 mg via INTRAMUSCULAR

## 2023-01-12 NOTE — Progress Notes (Unsigned)
I,Jameka J Llittleton, CMA,acting as a Neurosurgeon for [employer], NP.,have documented all relevant documentation on the behalf of [name], NP,as directed by  [name], NP while in the presence of [name], NP.  Subjective:  Patient ID: Patricia French , female    DOB: [birth year] , 47 y.o.   MRN: [id number]  No chief complaint on file.   HPI  Patient presents today for upper respiratory infection, she reports she was diagnosed with ear infection on Monday at Urgent care and started on Omnicef 300mg  BID X 10 days. Patient states that she has not gotten much relief from the pain in her ears  rating the pain 7/10. Patient  also states that her nose is congested, she has a cough and her throat is sore. She denies any fever.  Patient was advised to make sure she completes all the antibiotics that was prescribed for her. She voiced understanding.     Past Medical History:  Diagnosis Date   Allergy In childhood   Anxiety    Arthritis 2023   Depression On and off for more than 10 years   GERD (gastroesophageal reflux disease) More than 10 years ago   Heart murmur N/A   HSV-2 (herpes simplex virus 2) infection    Hyperlipidemia      Family History  Problem Relation Age of Onset   Diabetes Mother    Hypertension Mother    Hyperlipidemia Mother    Heart disease Mother    Varicose Veins Mother    Anxiety disorder Mother    Arthritis Mother    Heart attack Father    Bipolar disorder Father    Schizophrenia Father    Hyperlipidemia Father    Alzheimer's disease Father    Cancer Maternal Grandmother    Migraines Maternal Grandmother    Hypertension Maternal Grandmother    Alzheimer's disease Paternal Grandmother    Leukemia Paternal Grandfather    Colon cancer Neg Hx    Colon polyps Neg Hx    Esophageal cancer Neg Hx    Rectal cancer Neg Hx    Stomach cancer Neg Hx      Current Outpatient Medications:    acetic acid-hydrocortisone (VOSOL-HC) OTIC solution, Place 2 drops  into both ears 3 (three) times daily for 7 days., Disp: 2.1 mL, Rfl: 0   ALPRAZolam (XANAX) 0.5 MG tablet, Take 1 tablet (0.5 mg total) by mouth 2 (two) times daily as needed for anxiety. alprazolam 0.5 mg tablet, Disp: 30 tablet, Rfl: 0   Ascorbic Acid (VITAMIN C) 1000 MG tablet, Take by mouth., Disp: , Rfl:    benzonatate (TESSALON PERLES) 100 MG capsule, Take 1 capsule (100 mg total) by mouth 3 (three) times daily as needed for cough., Disp: 30 capsule, Rfl: 1   estradiol (VIVELLE-DOT) 0.075 MG/24HR, Place 1 patch onto the skin daily at 6 (six) AM., Disp: , Rfl:    FLUoxetine (PROZAC) 10 MG capsule, Take 1 capsule (10 mg total) by mouth daily., Disp: 90 capsule, Rfl: 1   fluticasone (FLONASE) 50 MCG/ACT nasal spray, , Disp: , Rfl:    Lysine 1000 MG TABS, Take 1,000 mg by mouth daily., Disp: , Rfl:    Magnesium 250 MG TABS, Take 1 tablet (250 mg total) by mouth daily. Take with evening meal, Disp: 90 tablet, Rfl: 1   progesterone (PROMETRIUM) 100 MG capsule, Take 100 mg by mouth daily., Disp: , Rfl:    rosuvastatin (CRESTOR) 5 MG tablet, Take 1 tablet (5 mg  total) by mouth daily., Disp: 90 tablet, Rfl: 2   valACYclovir (VALTREX) 500 MG tablet, Take 500 mg by mouth daily at 6 (six) AM., Disp: , Rfl:    Vitamin D, Ergocalciferol, (DRISDOL) 1.25 MG (50000 UNIT) CAPS capsule, Take 1 capsule by mouth once a week, Disp: 12 capsule, Rfl: 1   Zinc 20 MG CAPS, Take 1 capsule by mouth daily., Disp: , Rfl:    Allergies  Allergen Reactions   Penicillins Hives   Pork-Derived Products      Review of Systems  Constitutional: Negative.   HENT:  Positive for congestion and ear pain. Negative for ear discharge.   Respiratory:  Positive for cough.   Endocrine: Negative.   Musculoskeletal: Negative.   Skin: Negative.   Neurological: Negative.   Psychiatric/Behavioral: Negative.       Today's Vitals   01/12/23 1130  BP: 110/70  Pulse: 76  Temp: 98.4 F (36.9 C)  Weight: 166 lb (75.3 kg)  Height:  5\' 4"  (1.626 m)  PainSc: 6    Body mass index is 28.49 kg/m.  Wt Readings from Last 3 Encounters:  01/12/23 166 lb (75.3 kg)  10/28/22 169 lb (76.7 kg)  07/30/22 179 lb 3.2 oz (81.3 kg)    The 10-year ASCVD risk score (Arnett DK, et al., 2019) is: 0.9%   Values used to calculate the score:     Age: 26 years     Sex: Female     Is Non-Hispanic African American: Yes     Diabetic: No     Tobacco smoker: No     Systolic Blood Pressure: 110 mmHg     Is BP treated: No     HDL Cholesterol: 51 mg/dL     Total Cholesterol: 172 mg/dL  Objective:  Physical Exam HENT:     Right Ear: External ear normal. No decreased hearing noted. A middle ear effusion is present. There is no impacted cerumen.     Left Ear: External ear normal. No decreased hearing noted. A middle ear effusion is present. There is no impacted cerumen.     Nose: Congestion present.  Cardiovascular:     Rate and Rhythm: Normal rate and regular rhythm.  Pulmonary:     Effort: Pulmonary effort is normal.     Breath sounds: Normal breath sounds.  Abdominal:     General: Bowel sounds are normal.  Skin:    General: Skin is warm and dry.  Neurological:     General: No focal deficit present.     Mental Status: She is alert.  Psychiatric:        Mood and Affect: Mood normal.         Assessment And Plan:  URI with cough and congestion -     Triamcinolone Acetonide -     Benzonatate; Take 1 capsule (100 mg total) by mouth 3 (three) times daily as needed for cough.  Dispense: 30 capsule; Refill: 1  Otalgia of both ears -     Hydrocortisone-Acetic Acid; Place 2 drops into both ears 3 (three) times daily for 7 days.  Dispense: 2.1 mL; Refill: 0    Return if symptoms worsen or fail to improve, for Keep Scheduled appt.  Patient was given opportunity to ask questions. Patient verbalized understanding of the plan and was able to repeat key elements of the plan. All questions were answered to their satisfaction.   I, [name], NP, have reviewed all documentation for this visit. The documentation  on 01/16/2023 for the exam, diagnosis, procedures, and orders are all accurate and complete.     IF YOU HAVE BEEN REFERRED TO A SPECIALIST, IT MAY TAKE 1-2 WEEKS TO SCHEDULE/PROCESS THE REFERRAL. IF YOU HAVE NOT HEARD FROM US/SPECIALIST IN TWO WEEKS, PLEASE GIVE [place] A CALL AT [phone].

## 2023-01-18 DIAGNOSIS — J069 Acute upper respiratory infection, unspecified: Secondary | ICD-10-CM | POA: Insufficient documentation

## 2023-01-18 DIAGNOSIS — H9203 Otalgia, bilateral: Secondary | ICD-10-CM | POA: Insufficient documentation

## 2023-01-18 HISTORY — DX: Otalgia, bilateral: H92.03

## 2023-01-19 ENCOUNTER — Other Ambulatory Visit: Payer: Self-pay | Admitting: Family Medicine

## 2023-01-19 ENCOUNTER — Encounter: Payer: Self-pay | Admitting: Nurse Practitioner

## 2023-01-28 ENCOUNTER — Ambulatory Visit (HOSPITAL_COMMUNITY)
Admission: RE | Admit: 2023-01-28 | Discharge: 2023-01-28 | Disposition: A | Discharge status: Home / Self Care | Payer: 59 | Source: Ambulatory Visit | Attending: Cardiology | Admitting: Cardiology

## 2023-01-28 ENCOUNTER — Other Ambulatory Visit (HOSPITAL_COMMUNITY): Payer: Self-pay | Admitting: Medical

## 2023-01-28 ENCOUNTER — Encounter: Payer: Self-pay | Admitting: Nurse Practitioner

## 2023-01-28 DIAGNOSIS — M79604 Pain in right leg: Secondary | ICD-10-CM | POA: Insufficient documentation

## 2023-01-28 DIAGNOSIS — M7989 Other specified soft tissue disorders: Secondary | ICD-10-CM | POA: Diagnosis present

## 2023-02-02 ENCOUNTER — Other Ambulatory Visit: Payer: Self-pay

## 2023-02-02 ENCOUNTER — Emergency Department (HOSPITAL_BASED_OUTPATIENT_CLINIC_OR_DEPARTMENT_OTHER)
Admission: EM | Admit: 2023-02-02 | Discharge: 2023-02-02 | Disposition: A | Discharge status: Home / Self Care | Payer: 59 | Attending: Emergency Medicine | Admitting: Emergency Medicine

## 2023-02-02 ENCOUNTER — Emergency Department (HOSPITAL_BASED_OUTPATIENT_CLINIC_OR_DEPARTMENT_OTHER): Payer: 59

## 2023-02-02 ENCOUNTER — Encounter (HOSPITAL_BASED_OUTPATIENT_CLINIC_OR_DEPARTMENT_OTHER): Payer: Self-pay | Admitting: Emergency Medicine

## 2023-02-02 DIAGNOSIS — D72829 Elevated white blood cell count, unspecified: Secondary | ICD-10-CM | POA: Diagnosis not present

## 2023-02-02 DIAGNOSIS — D751 Secondary polycythemia: Secondary | ICD-10-CM | POA: Insufficient documentation

## 2023-02-02 DIAGNOSIS — R770 Abnormality of albumin: Secondary | ICD-10-CM | POA: Diagnosis not present

## 2023-02-02 DIAGNOSIS — R1031 Right lower quadrant pain: Secondary | ICD-10-CM | POA: Diagnosis not present

## 2023-02-02 DIAGNOSIS — R109 Unspecified abdominal pain: Secondary | ICD-10-CM | POA: Diagnosis present

## 2023-02-02 DIAGNOSIS — R1032 Left lower quadrant pain: Secondary | ICD-10-CM | POA: Insufficient documentation

## 2023-02-02 DIAGNOSIS — R112 Nausea with vomiting, unspecified: Secondary | ICD-10-CM | POA: Diagnosis not present

## 2023-02-02 DIAGNOSIS — R7989 Other specified abnormal findings of blood chemistry: Secondary | ICD-10-CM | POA: Insufficient documentation

## 2023-02-02 LAB — COMPREHENSIVE METABOLIC PANEL
ALT: 29 U/L (ref 0–44)
AST: 29 U/L (ref 15–41)
Albumin: 5.4 g/dL — ABNORMAL HIGH (ref 3.5–5.0)
Alkaline Phosphatase: 83 U/L (ref 38–126)
Anion gap: 9 (ref 5–15)
BUN: 9 mg/dL (ref 6–20)
CO2: 28 mmol/L (ref 22–32)
Calcium: 10.3 mg/dL (ref 8.9–10.3)
Chloride: 101 mmol/L (ref 98–111)
Creatinine, Ser: 0.68 mg/dL (ref 0.44–1.00)
GFR, Estimated: >60 mL/min (ref >60)
Glucose, Bld: 90 mg/dL (ref 70–99)
Potassium: 3.7 mmol/L (ref 3.5–5.1)
Sodium: 138 mmol/L (ref 135–145)
Total Bilirubin: 1.1 mg/dL (ref <1.2)
Total Protein: 8.7 g/dL — ABNORMAL HIGH (ref 6.5–8.1)

## 2023-02-02 LAB — URINALYSIS, ROUTINE W REFLEX MICROSCOPIC
Bilirubin Urine: NEGATIVE
Glucose, UA: NEGATIVE mg/dL
Hgb urine dipstick: NEGATIVE
Ketones, ur: 15 mg/dL — AB
Leukocytes,Ua: NEGATIVE
Nitrite: NEGATIVE
Specific Gravity, Urine: 1.011 (ref 1.005–1.030)
pH: 8 (ref 5.0–8.0)

## 2023-02-02 LAB — CBC
HCT: 45.4 % (ref 36.0–46.0)
Hemoglobin: 15.2 g/dL — ABNORMAL HIGH (ref 12.0–15.0)
MCH: 29.6 pg (ref 26.0–34.0)
MCHC: 33.5 g/dL (ref 30.0–36.0)
MCV: 88.5 fL (ref 80.0–100.0)
Platelets: 392 10*3/uL (ref 150–400)
RBC: 5.13 MIL/uL — ABNORMAL HIGH (ref 3.87–5.11)
RDW: 12.4 % (ref 11.5–15.5)
WBC: 11.9 10*3/uL — ABNORMAL HIGH (ref 4.0–10.5)
nRBC: 0 % (ref 0.0–0.2)

## 2023-02-02 LAB — LIPASE, BLOOD: Lipase: 14 U/L (ref 11–51)

## 2023-02-02 MED ORDER — ONDANSETRON HCL 4 MG PO TABS: 4.0000 mg | ORAL_TABLET | Freq: Three times a day (TID) | ORAL | 0 refills | Status: DC | PRN

## 2023-02-02 MED ORDER — IOHEXOL 300 MG/ML  SOLN
100.0000 mL | Freq: Once | INTRAMUSCULAR | Status: AC | PRN
  Administered 2023-02-02: 85 mL via INTRAVENOUS

## 2023-02-02 MED ORDER — ONDANSETRON HCL 4 MG/2ML IJ SOLN
4.0000 mg | Freq: Once | INTRAMUSCULAR | Status: AC
  Administered 2023-02-02: 4 mg via INTRAVENOUS
  Filled 2023-02-02: qty 2

## 2023-02-02 MED ORDER — MORPHINE SULFATE (PF) 4 MG/ML IV SOLN
4.0000 mg | Freq: Once | INTRAVENOUS | Status: AC
  Administered 2023-02-02: 4 mg via INTRAVENOUS
  Filled 2023-02-02: qty 1

## 2023-02-02 MED ORDER — KETOROLAC TROMETHAMINE 30 MG/ML IJ SOLN
30.0000 mg | Freq: Once | INTRAMUSCULAR | Status: AC
  Administered 2023-02-02: 30 mg via INTRAVENOUS
  Filled 2023-02-02: qty 1

## 2023-02-02 NOTE — ED Provider Notes (Signed)
[place] EMERGENCY DEPARTMENT AT [facility] Provider Note   CSN: [id number] Arrival date & time: 02/02/23  1536     History  Chief Complaint  Patient presents with   Emesis   Abdominal Pain    Patricia French is a 47 y.o. female presents today for abdominal pain and emesis x 6 hours.  Patient states that she has sharp, spasm-like pain in her perineum.  Patient states she has had episodes like this before that resolved on their own.  Patient denies diarrhea, constipation, hematuria, blood in stool, dysuria, fever, shortness of breath, or chest pain.  Patient has had gallbladder surgery but no other abdominal surgeries.   Emesis Associated symptoms: abdominal pain   Abdominal Pain Associated symptoms: nausea and vomiting        Home Medications Prior to Admission medications   Medication Sig Start Date End Date Taking? Authorizing Provider  ondansetron (ZOFRAN) 4 MG tablet Take 1 tablet (4 mg total) by mouth every 8 (eight) hours as needed for nausea or vomiting. 02/02/23  Yes Dolphus Jenny, PA-C  ALPRAZolam Prudy Feeler) 0.5 MG tablet Take 1 tablet (0.5 mg total) by mouth 2 (two) times daily as needed for anxiety. alprazolam 0.5 mg tablet 09/03/22   Mozingo, Thereasa Solo, NP  Ascorbic Acid (VITAMIN C) 1000 MG tablet Take by mouth.    [provider]  benzonatate (TESSALON PERLES) 100 MG capsule Take 1 capsule (100 mg total) by mouth 3 (three) times daily as needed for cough. 01/12/23 01/12/24  [name], NP  estradiol (VIVELLE-DOT) 0.075 MG/24HR Place 1 patch onto the skin daily at 6 (six) AM.    [provider]  FLUoxetine (PROZAC) 10 MG capsule Take 1 capsule (10 mg total) by mouth daily. 10/01/22   Mozingo, Thereasa Solo, NP  fluticasone Aleda Grana) 50 MCG/ACT nasal spray  10/30/19   [provider]  Lysine 1000 MG TABS Take 1,000 mg by mouth daily.    [provider]  Magnesium 250 MG TABS Take 1 tablet (250 mg total) by mouth  daily. Take with evening meal 06/04/20   Arnette Felts, FNP  progesterone (PROMETRIUM) 100 MG capsule Take 100 mg by mouth daily.    [provider]  rosuvastatin (CRESTOR) 5 MG tablet Take 1 tablet (5 mg total) by mouth daily. 07/27/22 07/27/23  Arnette Felts, FNP  valACYclovir (VALTREX) 500 MG tablet Take 500 mg by mouth daily at 6 (six) AM.    [provider]  Vitamin D, Ergocalciferol, (DRISDOL) 1.25 MG (50000 UNIT) CAPS capsule Take 1 capsule by mouth once a week 07/22/22   Arnette Felts, FNP  Zinc 20 MG CAPS Take 1 capsule by mouth daily.    [provider]      Allergies    Penicillins and Pork-derived products    Review of Systems   Review of Systems  Gastrointestinal:  Positive for abdominal pain, nausea and vomiting.    Physical Exam Updated Vital Signs BP (!) 180/97 (BP Location: Left Arm)   Pulse 73   Temp 99.1 F (37.3 C) (Oral)   Resp 18   Ht 5\' 4"  (1.626 m)   Wt 75.8 kg   SpO2 100%   BMI 28.67 kg/m  Physical Exam Vitals and nursing note reviewed.  Constitutional:      General: She is not in acute distress.    Appearance: She is well-developed.  HENT:     Head: Normocephalic and atraumatic.  Eyes:     Conjunctiva/sclera: Conjunctivae  normal.  Cardiovascular:     Rate and Rhythm: Normal rate and regular rhythm.     Heart sounds: Normal heart sounds. No murmur heard. Pulmonary:     Effort: Pulmonary effort is normal. No respiratory distress.     Breath sounds: Normal breath sounds.  Abdominal:     General: Bowel sounds are normal.     Palpations: Abdomen is soft.     Tenderness: There is abdominal tenderness in the right lower quadrant, suprapubic area and left lower quadrant. There is no right CVA tenderness or left CVA tenderness.  Musculoskeletal:        General: No swelling.     Cervical back: Neck supple.  Skin:    General: Skin is warm and dry.     Capillary Refill: Capillary refill takes less than 2 seconds.  Neurological:      Mental Status: She is alert.  Psychiatric:        Mood and Affect: Mood normal.     ED Results / Procedures / Treatments   Labs (all labs ordered are listed, but only abnormal results are displayed) Labs Reviewed  COMPREHENSIVE METABOLIC PANEL - Abnormal; Notable for the following components:      Result Value   Total Protein 8.7 (*)    Albumin 5.4 (*)    All other components within normal limits  CBC - Abnormal; Notable for the following components:   WBC 11.9 (*)    RBC 5.13 (*)    Hemoglobin 15.2 (*)    All other components within normal limits  URINALYSIS, ROUTINE W REFLEX MICROSCOPIC - Abnormal; Notable for the following components:   Color, Urine COLORLESS (*)    Ketones, ur 15 (*)    Protein, ur TRACE (*)    All other components within normal limits  LIPASE, BLOOD    EKG None  Radiology CT ABDOMEN PELVIS W CONTRAST  Result Date: 02/02/2023 CLINICAL DATA:  Abdominal pain. EXAM: CT ABDOMEN AND PELVIS WITH CONTRAST TECHNIQUE: Multidetector CT imaging of the abdomen and pelvis was performed using the standard protocol following bolus administration of intravenous contrast. RADIATION DOSE REDUCTION: This exam was performed according to the departmental dose-optimization program which includes automated exposure control, adjustment of the mA and/or kV according to patient size and/or use of iterative reconstruction technique. CONTRAST:  85mL OMNIPAQUE IOHEXOL 300 MG/ML  SOLN COMPARISON:  None Available. FINDINGS: Lower chest: The visualized lung bases are clear. No intra-abdominal free air or free fluid. Hepatobiliary: The liver is unremarkable. There is mild biliary dilatation, post cholecystectomy. No retained calcified stone noted in the central CBD. Pancreas: Unremarkable. No pancreatic ductal dilatation or surrounding inflammatory changes. Spleen: Normal in size without focal abnormality. Adrenals/Urinary Tract: The adrenal glands unremarkable. The kidneys, visualized  ureters, and urinary bladder appear unremarkable. Stomach/Bowel: There is sigmoid diverticulosis and scattered colonic diverticula without active inflammatory changes. There is no bowel obstruction or active inflammation. The appendix is normal. Vascular/Lymphatic: Mild atherosclerotic calcification of the abdominal aorta. The IVC is unremarkable. No portal venous gas. There is no adenopathy. Reproductive: Hysterectomy.  No adnexal masses. Other: None Musculoskeletal: No acute or significant osseous findings. IMPRESSION: 1. No acute intra-abdominal or pelvic pathology. 2. Colonic diverticulosis. No bowel obstruction. Normal appendix. 3.  Aortic Atherosclerosis (ICD10-I70.0). Electronically Signed   By: Elgie Collard M.D.   On: 02/02/2023 20:49    Procedures Procedures    Medications Ordered in ED Medications  ketorolac (TORADOL) 30 MG/ML injection 30 mg (has no administration in time  range)  ondansetron (ZOFRAN) injection 4 mg (4 mg Intravenous Given 02/02/23 1850)  morphine (PF) 4 MG/ML injection 4 mg (4 mg Intravenous Given 02/02/23 1851)  iohexol (OMNIPAQUE) 300 MG/ML solution 100 mL (85 mLs Intravenous Contrast Given 02/02/23 1957)    ED Course/ Medical Decision Making/ A&P                                 Medical Decision Making Amount and/or Complexity of Data Reviewed Labs: ordered. Radiology: ordered.  Risk Prescription drug management.   This patient presents to the ED with chief complaint(s) of abdominal pain and emesis with pertinent past medical history of none which further complicates the presenting complaint. The complaint involves an extensive differential diagnosis and also carries with it a high risk of complications and morbidity.    The differential diagnosis includes gastroenteritis, diverticulitis  Additional history obtained: Records reviewed Primary Care Documents  ED Course and Reassessment:   Independent labs interpretation:  The following labs were  independently interpreted:  CBC: Mild leukocytosis at 11.9, mild polycythemia CMP: Mildly elevated albumin, mildly elevated total protein Lipase: 14 UA: 15 ketones, trace protein  Independent visualization of imaging: - I independently visualized the following imaging with scope of interpretation limited to determining acute life threatening conditions related to emergency care: CT abdomen pelvis with contrast, which revealed no acute intra-abdominal or pelvic pathology.  Colonic diverticulosis.  No bowel obstruction.  Normal appendix  Consultation: - Consulted or discussed management/test interpretation w/ external professional: None  Consideration for admission or further workup: Considered for admission or further workup however patient's vital signs, physical exam, labs, and imaging of all been reassuring.  Patient symptoms likely due to gastrointestinal illness.  Patient will be given outpatient Zofran for nausea and vomiting as needed.  Patient should follow-up with PCP if symptoms persist for further evaluation and treatment.         Final Clinical Impression(s) / ED Diagnoses Final diagnoses:  Nausea and vomiting, unspecified vomiting type  Abdominal pain, unspecified abdominal location    Rx / DC Orders ED Discharge Orders          Ordered    ondansetron (ZOFRAN) 4 MG tablet  Every 8 hours PRN        02/02/23 2106              Dolphus Jenny, PA-C 02/02/23 2107    Rondel Baton, MD 02/04/23 1140

## 2023-02-02 NOTE — ED Triage Notes (Signed)
Pt via pov from home with emesis and abdominal pain today. Denies diarrhea, constipation.  Pt reports that she also has perineal "spasms." Pt alert & oriented, nad noted.

## 2023-02-02 NOTE — Discharge Instructions (Signed)
Today you were seen for abdominal pain and vomiting.  Please pick up your prescription and take as needed for nausea and vomiting.  Please follow-up with your PCP for further evaluation and treatment if your symptoms persist.  Thank you for letting us treat you today. After reviewing your labs and imaging, I feel you are safe to go home. Please follow up with your PCP in the next several days and provide them with your records from this visit. Return to the Emergency Room if pain becomes severe or symptoms worsen.

## 2023-03-10 ENCOUNTER — Ambulatory Visit: Payer: 59 | Admitting: Family Medicine

## 2023-03-10 ENCOUNTER — Encounter: Payer: Self-pay | Admitting: Family Medicine

## 2023-03-10 VITALS — BP 120/70 | HR 72 | Temp 98.2°F | Ht 64.0 in | Wt 167.0 lb

## 2023-03-10 DIAGNOSIS — J069 Acute upper respiratory infection, unspecified: Secondary | ICD-10-CM

## 2023-03-10 DIAGNOSIS — E782 Mixed hyperlipidemia: Secondary | ICD-10-CM | POA: Diagnosis not present

## 2023-03-10 MED ORDER — ROSUVASTATIN CALCIUM 5 MG PO TABS
5.0000 mg | ORAL_TABLET | Freq: Every day | ORAL | 1 refills | Status: DC
Start: 2023-03-10 — End: 2023-09-28

## 2023-03-10 MED ORDER — PROMETHAZINE-DM 6.25-15 MG/5ML PO SYRP
5.0000 mL | ORAL_SOLUTION | Freq: Two times a day (BID) | ORAL | 0 refills | Status: DC | PRN
Start: 2023-03-10 — End: 2023-07-25

## 2023-03-10 MED ORDER — AZITHROMYCIN 250 MG PO TABS: ORAL_TABLET | ORAL | 0 refills | Status: AC

## 2023-03-10 MED ORDER — PREDNISONE 5 MG PO TABS: ORAL_TABLET | ORAL | 0 refills | Status: DC

## 2023-03-10 NOTE — Progress Notes (Signed)
I,Patricia French, CMA,acting as a Neurosurgeon for [employer], NP.,have documented all relevant documentation on the behalf of Patricia Hose, NP,as directed by  Patricia Hose, NP while in the presence of Patricia Hose, NP.  Subjective:  Patient ID: Patricia French , female    DOB: [birth year] , 48 y.o.   MRN: [id number]  Chief Complaint  Patient presents with   URI    HPI  Patient is a 48 year female who presents today for a productive cough that started about  2 weeks ago. She reports the cough has not gotten any better she is congested and having ear aches too. Patient states that her son was initially sick , then she got sick too. She denies any fever or chills.   URI  This is a new problem. The current episode started 1 to 4 weeks ago. The problem has been gradually worsening. There has been no fever. Associated symptoms include congestion, coughing, ear pain, headaches and sinus pain. She has tried decongestant for the symptoms. The treatment provided mild relief.     Past Medical History:  Diagnosis Date   Allergy In childhood   Anxiety    Arthritis 2023   Depression On and off for more than 10 years   GERD (gastroesophageal reflux disease) More than 10 years ago   Heart murmur N/A   HSV-2 (herpes simplex virus 2) infection    Hyperlipidemia      Family History  Problem Relation Age of Onset   Diabetes Mother    Hypertension Mother    Hyperlipidemia Mother    Heart disease Mother    Varicose Veins Mother    Anxiety disorder Mother    Arthritis Mother    Heart attack Father    Bipolar disorder Father    Schizophrenia Father    Hyperlipidemia Father    Alzheimer's disease Father    Cancer Maternal Grandmother    Migraines Maternal Grandmother    Hypertension Maternal Grandmother    Alzheimer's disease Paternal Grandmother    Leukemia Paternal Grandfather    Colon cancer Neg Hx    Colon polyps Neg Hx    Esophageal cancer Neg Hx    Rectal cancer Neg Hx    Stomach cancer  Neg Hx      Current Outpatient Medications:    ALPRAZolam (XANAX) 0.5 MG tablet, Take 1 tablet (0.5 mg total) by mouth 2 (two) times daily as needed for anxiety. alprazolam 0.5 mg tablet, Disp: 30 tablet, Rfl: 0   Ascorbic Acid (VITAMIN C) 1000 MG tablet, Take by mouth., Disp: , Rfl:    azithromycin (ZITHROMAX) 250 MG tablet, Take 2 tablets (500 mg) on  Day 1,  followed by 1 tablet (250 mg) once daily on Days 2 through 5., Disp: 6 each, Rfl: 0   estradiol (VIVELLE-DOT) 0.075 MG/24HR, Place 1 patch onto the skin daily at 6 (six) AM., Disp: , Rfl:    FLUoxetine (PROZAC) 10 MG capsule, Take 1 capsule (10 mg total) by mouth daily., Disp: 90 capsule, Rfl: 1   fluticasone (FLONASE) 50 MCG/ACT nasal spray, , Disp: , Rfl:    Lysine 1000 MG TABS, Take 1,000 mg by mouth daily., Disp: , Rfl:    Magnesium 250 MG TABS, Take 1 tablet (250 mg total) by mouth daily. Take with evening meal, Disp: 90 tablet, Rfl: 1   predniSONE (DELTASONE) 5 MG tablet, Take as prescribed, Disp: 21 tablet, Rfl: 0   progesterone (PROMETRIUM) 100 MG capsule, Take 100 mg  by mouth daily., Disp: , Rfl:    promethazine-dextromethorphan (PROMETHAZINE-DM) 6.25-15 MG/5ML syrup, Take 5 mLs by mouth 2 (two) times daily as needed for cough., Disp: 118 mL, Rfl: 0   traMADol (ULTRAM) 50 MG tablet, Take 50 mg by mouth 4 (four) times daily., Disp: , Rfl:    valACYclovir (VALTREX) 500 MG tablet, Take 500 mg by mouth daily at 6 (six) AM., Disp: , Rfl:    Vitamin D, Ergocalciferol, (DRISDOL) 1.25 MG (50000 UNIT) CAPS capsule, Take 1 capsule by mouth once a week, Disp: 12 capsule, Rfl: 1   Zinc 20 MG CAPS, Take 1 capsule by mouth daily., Disp: , Rfl:    rosuvastatin (CRESTOR) 5 MG tablet, Take 1 tablet (5 mg total) by mouth daily., Disp: 90 tablet, Rfl: 1   Allergies  Allergen Reactions   Penicillins Hives   Pork-Derived Products      Review of Systems  Constitutional: Negative.   HENT:  Positive for congestion, ear pain and sinus pain.    Eyes: Negative.   Respiratory:  Positive for cough.   Cardiovascular: Negative.   Gastrointestinal: Negative.   Musculoskeletal: Negative.   Skin: Negative.   Neurological:  Positive for headaches.  Psychiatric/Behavioral: Negative.       Today's Vitals   03/10/23 0848  BP: 120/70  Pulse: 72  Temp: 98.2 F (36.8 C)  Weight: 167 lb (75.8 kg)  Height: 5\' 4"  (1.626 m)  PainSc: 0-No pain   Body mass index is 28.67 kg/m.  Wt Readings from Last 3 Encounters:  03/10/23 167 lb (75.8 kg)  02/02/23 167 lb (75.8 kg)  01/12/23 166 lb (75.3 kg)    The 10-year ASCVD risk score (Arnett DK, et al., 2019) is: 1.3%   Values used to calculate the score:     Age: 67 years     Sex: Female     Is Non-Hispanic African American: Yes     Diabetic: No     Tobacco smoker: No     Systolic Blood Pressure: 120 mmHg     Is BP treated: No     HDL Cholesterol: 51 mg/dL     Total Cholesterol: 172 mg/dL  Objective:  Physical Exam HENT:     Head: Normocephalic.     Right Ear: Tympanic membrane normal.  Cardiovascular:     Rate and Rhythm: Normal rate.     Pulses: Normal pulses.  Pulmonary:     Effort: Pulmonary effort is normal.  Musculoskeletal:        General: Normal range of motion.  Skin:    General: Skin is warm.  Neurological:     Mental Status: She is alert and oriented to person, place, and time.  Psychiatric:        Mood and Affect: Mood normal.        Behavior: Behavior normal.         Assessment And Plan:  URI with cough and congestion -     Azithromycin; Take 2 tablets (500 mg) on  Day 1,  followed by 1 tablet (250 mg) once daily on Days 2 through 5.  Dispense: 6 each; Refill: 0 -     Promethazine-DM; Take 5 mLs by mouth 2 (two) times daily as needed for cough.  Dispense: 118 mL; Refill: 0 -     predniSONE; Take as prescribed  Dispense: 21 tablet; Refill: 0  Mixed hyperlipidemia -     Rosuvastatin Calcium; Take 1 tablet (5 mg total) by mouth daily.  Dispense: 90 tablet;  Refill: 1    Return if symptoms worsen or fail to improve, for keep next appt.  Patient was given opportunity to ask questions. Patient verbalized understanding of the plan and was able to repeat key elements of the plan. All questions were answered to their satisfaction.    I, Patricia Hose, NP, have reviewed all documentation for this visit. The documentation on 03/13/2023 for the exam, diagnosis, procedures, and orders are all accurate and complete.    IF YOU HAVE BEEN REFERRED TO A SPECIALIST, IT MAY TAKE 1-2 WEEKS TO SCHEDULE/PROCESS THE REFERRAL. IF YOU HAVE NOT HEARD FROM US/SPECIALIST IN TWO WEEKS, PLEASE GIVE [place] A CALL AT [phone].

## 2023-04-15 ENCOUNTER — Ambulatory Visit: Payer: 59 | Admitting: Adult Health

## 2023-04-15 ENCOUNTER — Encounter: Payer: Self-pay | Admitting: Adult Health

## 2023-04-15 DIAGNOSIS — F410 Panic disorder [episodic paroxysmal anxiety] without agoraphobia: Secondary | ICD-10-CM

## 2023-04-15 DIAGNOSIS — F411 Generalized anxiety disorder: Secondary | ICD-10-CM

## 2023-04-15 DIAGNOSIS — F4310 Post-traumatic stress disorder, unspecified: Secondary | ICD-10-CM | POA: Diagnosis not present

## 2023-04-15 DIAGNOSIS — F331 Major depressive disorder, recurrent, moderate: Secondary | ICD-10-CM | POA: Diagnosis not present

## 2023-04-15 MED ORDER — FLUOXETINE HCL 10 MG PO CAPS
10.0000 mg | ORAL_CAPSULE | Freq: Every day | ORAL | 1 refills | Status: AC
Start: 2023-04-15 — End: ?

## 2023-04-15 NOTE — Progress Notes (Signed)
[name] [id number] Jun 30, 1975 48 y.o.  Subjective:   Patient ID:  Patricia French is a 48 y.o. (DOB March 14, 1975) female.  Chief Complaint: No chief complaint on file.   HPI Patricia French presents to the office today for follow-up of MDD, GAD, PTSD, panic attacks,   Referred by PCP Arnette Felts - PCP.  Describes mood today as "ok". Pleasant. Denies tearfulness. Mood symptoms - reports decreased anxiety, depression and irritability. Reports improved interest and motivation. Reports decreased panic attacks - "subsided a lot". Reports some worry, rumination and over thinking. Mood is more consistent. Stating "I feel like I'm doing ok".  Feels like addition of Prozac has been helpful to manage mood symptoms. Taking medications as prescribed.  Energy levels stable. Active, does not have a regular exercise routine.  Enjoys some usual interests and activities. Divorced. Engaged. Has 3 children - 3 grandchildren. Spending time with family. Appetite adequate. Weight gain. Sleeping better at night - TMJ wears mouth guard. Wakes up at 4:30 a lot of mornings. Averages 7 hours during the week and longer on the weekends. Reports focus and concentration difficulties - starting multiple tasks. Completing tasks. Managing aspects of household. Works full time for [employer].  Denies SI or HI. Reports a suicide attempt in high school - over dosed on tylenol. Denies AH or VH. Denies self harm. Denies substance use.  Has worked work a Paramedic  Previous medication trials:  Xanax, Lorazepam, Wellbutrin, Trintellix   GAD-7    Flowsheet Row Office Visit from 07/15/2022 in [facility] Office Visit from 06/01/2022 in [facility]  Total GAD-7 Score 4 7      PHQ2-9    Flowsheet Row Office Visit from 01/12/2023 in [facility] Office Visit from 07/15/2022 in [facility] Office Visit from 06/01/2022 in [facility] Office Visit from 02/25/2022 in [facility] Office Visit from 06/04/2020 in [facility]  PHQ-2 Total Score 0 2 3 1  0  PHQ-9 Total Score 0 2 8 9  --      Flowsheet Row ED from 02/02/2023 in [facility] Emergency Department at [facility] ED from 04/28/2022 in [facility] Emergency Department at [facility]  C-SSRS RISK CATEGORY No Risk No Risk        Review of Systems:  Review of Systems  Musculoskeletal:  Negative for gait problem.  Neurological:  Negative for tremors.  Psychiatric/Behavioral:         Please refer to HPI    Medications: I have reviewed the patient's current medications.  Current Outpatient Medications  Medication Sig Dispense Refill   ALPRAZolam (XANAX) 0.5 MG tablet Take 1 tablet (0.5 mg total) by mouth 2 (two) times daily as needed for anxiety. alprazolam 0.5 mg tablet 30 tablet 0   Ascorbic Acid (VITAMIN C) 1000 MG tablet Take by mouth.     estradiol (VIVELLE-DOT) 0.075 MG/24HR Place 1 patch onto the skin daily at 6 (six) AM.     FLUoxetine (PROZAC) 10 MG capsule Take 1 capsule (10 mg total) by mouth daily. 90 capsule 1   fluticasone (FLONASE) 50 MCG/ACT nasal spray      Lysine 1000 MG TABS Take 1,000 mg by mouth daily.     Magnesium 250 MG TABS Take 1 tablet (250 mg total) by mouth daily. Take with evening meal 90 tablet 1  predniSONE (DELTASONE) 5 MG tablet Take as prescribed 21 tablet 0   progesterone (PROMETRIUM) 100 MG capsule Take 100 mg by mouth daily.     promethazine-dextromethorphan (PROMETHAZINE-DM) 6.25-15 MG/5ML syrup Take 5 mLs by mouth 2 (two) times daily as needed for cough. 118 mL 0   rosuvastatin (CRESTOR) 5 MG tablet Take 1 tablet (5 mg total) by mouth daily. 90 tablet 1   traMADol (ULTRAM) 50 MG tablet Take 50 mg by mouth 4 (four) times daily.     valACYclovir  (VALTREX) 500 MG tablet Take 500 mg by mouth daily at 6 (six) AM.     Vitamin D, Ergocalciferol, (DRISDOL) 1.25 MG (50000 UNIT) CAPS capsule Take 1 capsule by mouth once a week 12 capsule 1   Zinc 20 MG CAPS Take 1 capsule by mouth daily.     No current facility-administered medications for this visit.    Medication Side Effects: None  Allergies:  Allergies  Allergen Reactions   Penicillins Hives   Pork-Derived Products     Past Medical History:  Diagnosis Date   Allergy In childhood   Anxiety    Arthritis 2023   Depression On and off for more than 10 years   GERD (gastroesophageal reflux disease) More than 10 years ago   Heart murmur N/A   HSV-2 (herpes simplex virus 2) infection    Hyperlipidemia     Past Medical History, Surgical history, Social history, and Family history were reviewed and updated as appropriate.   Please see review of systems for further details on the patient's review from today.   Objective:   Physical Exam:  There were no vitals taken for this visit.  Physical Exam Constitutional:      General: She is not in acute distress. Musculoskeletal:        General: No deformity.  Neurological:     Mental Status: She is alert and oriented to person, place, and time.     Coordination: Coordination normal.  Psychiatric:        Attention and Perception: Attention and perception normal. She does not perceive auditory or visual hallucinations.        Mood and Affect: Affect is not labile, blunt, angry or inappropriate.        Speech: Speech normal.        Behavior: Behavior normal.        Thought Content: Thought content normal. Thought content is not paranoid or delusional. Thought content does not include homicidal or suicidal ideation. Thought content does not include homicidal or suicidal plan.        Cognition and Memory: Cognition and memory normal.        Judgment: Judgment normal.     Comments: Insight intact     Lab Review:     Component  Value Date/Time   NA 138 02/02/2023 1709   NA 142 10/28/2022 0857   K 3.7 02/02/2023 1709   CL 101 02/02/2023 1709   CO2 28 02/02/2023 1709   GLUCOSE 90 02/02/2023 1709   BUN 9 02/02/2023 1709   BUN 13 10/28/2022 0857   CREATININE 0.68 02/02/2023 1709   CALCIUM 10.3 02/02/2023 1709   PROT 8.7 (H) 02/02/2023 1709   PROT 6.8 07/15/2022 0927   ALBUMIN 5.4 (H) 02/02/2023 1709   ALBUMIN 4.6 07/15/2022 0927   AST 29 02/02/2023 1709   ALT 29 02/02/2023 1709   ALKPHOS 83 02/02/2023 1709   BILITOT 1.1 02/02/2023 1709   BILITOT 0.7 07/15/2022 7829  GFRNONAA >60 02/02/2023 1709       Component Value Date/Time   WBC 11.9 (H) 02/02/2023 1709   RBC 5.13 (H) 02/02/2023 1709   HGB 15.2 (H) 02/02/2023 1709   HGB 13.6 10/28/2022 0857   HCT 45.4 02/02/2023 1709   HCT 40.3 10/28/2022 0857   PLT 392 02/02/2023 1709   PLT 332 10/28/2022 0857   MCV 88.5 02/02/2023 1709   MCV 87 10/28/2022 0857   MCH 29.6 02/02/2023 1709   MCHC 33.5 02/02/2023 1709   RDW 12.4 02/02/2023 1709   RDW 11.9 10/28/2022 0857   LYMPHSABS 2.1 07/15/2022 0927   MONOABS 0.7 10/22/2020 1631   EOSABS 0.1 07/15/2022 0927   BASOSABS 0.0 07/15/2022 0927    No results found for: "POCLITH", "LITHIUM"   No results found for: "PHENYTOIN", "PHENOBARB", "VALPROATE", "CBMZ"   .res Assessment: Plan:    Plan:  PDMP reviewed  Prozac 10mg  daily for mood symptoms.  RTC 3 months  Patient advised to contact office with any questions, adverse effects, or acute worsening in signs and symptoms.  There are no diagnoses linked to this encounter.   Please see After Visit Summary for patient specific instructions.  Future Appointments  Date Time Provider Department Center  04/15/2023  9:00 AM Ranae Casebier, Thereasa Solo, NP [facility] None  07/25/2023  8:40 AM Arnette Felts, FNP [facility] None    No orders of the defined types were placed in this encounter.   -------------------------------

## 2023-05-17 ENCOUNTER — Telehealth: Payer: Self-pay | Admitting: Adult Health

## 2023-05-17 NOTE — Telephone Encounter (Signed)
 FYI  Pt called to report apt with [facility] is 5/23 for testing, per your referral. Needed to see you after that so RS with you  6/16. First available in office appt.

## 2023-07-13 ENCOUNTER — Telehealth: Payer: 59 | Admitting: Adult Health

## 2023-07-15 ENCOUNTER — Telehealth: Payer: Self-pay | Admitting: Adult Health

## 2023-07-15 NOTE — Telephone Encounter (Signed)
 Received via fax from [facility]. ADHD test results. Per provider scanned copy to chart and paper copy in provider box for next apt 6/17.  For review

## 2023-07-21 ENCOUNTER — Other Ambulatory Visit: Payer: Self-pay | Admitting: Obstetrics and Gynecology

## 2023-07-21 DIAGNOSIS — Z1231 Encounter for screening mammogram for malignant neoplasm of breast: Secondary | ICD-10-CM

## 2023-07-25 ENCOUNTER — Encounter: Payer: Self-pay | Admitting: Nurse Practitioner

## 2023-07-25 ENCOUNTER — Ambulatory Visit (INDEPENDENT_AMBULATORY_CARE_PROVIDER_SITE_OTHER): Payer: 59 | Admitting: Nurse Practitioner

## 2023-07-25 VITALS — BP 120/70 | HR 98 | Temp 97.9°F | Ht 64.0 in | Wt 183.0 lb

## 2023-07-25 DIAGNOSIS — E559 Vitamin D deficiency, unspecified: Secondary | ICD-10-CM | POA: Diagnosis not present

## 2023-07-25 DIAGNOSIS — F330 Major depressive disorder, recurrent, mild: Secondary | ICD-10-CM

## 2023-07-25 DIAGNOSIS — E66811 Obesity, class 1: Secondary | ICD-10-CM

## 2023-07-25 DIAGNOSIS — F419 Anxiety disorder, unspecified: Secondary | ICD-10-CM

## 2023-07-25 DIAGNOSIS — E782 Mixed hyperlipidemia: Secondary | ICD-10-CM

## 2023-07-25 DIAGNOSIS — E6609 Other obesity due to excess calories: Secondary | ICD-10-CM

## 2023-07-25 DIAGNOSIS — Z6831 Body mass index (BMI) 31.0-31.9, adult: Secondary | ICD-10-CM

## 2023-07-25 DIAGNOSIS — Z2821 Immunization not carried out because of patient refusal: Secondary | ICD-10-CM

## 2023-07-25 DIAGNOSIS — Z79899 Other long term (current) drug therapy: Secondary | ICD-10-CM

## 2023-07-25 DIAGNOSIS — Z0000 Encounter for general adult medical examination without abnormal findings: Secondary | ICD-10-CM

## 2023-07-25 NOTE — Progress Notes (Unsigned)
 [place], CMA,acting as a Neurosurgeon for Susanna Epley, FNP.,have documented all relevant documentation on the behalf of Susanna Epley, FNP,as directed by  Susanna Epley, FNP while in the presence of Susanna Epley, FNP.  Subjective:    Patient ID: Patricia French , female    DOB: October 13, 1975 , 48 y.o.   MRN: [id number]  Chief Complaint  Patient presents with   Annual Exam    Patient presents today for HM, Patient reports compliance with medication. Patient denies any chest pain, SOB, or headaches. Patient has no concerns today.     HPI  She went to a weight loss clinic called [facility]. She did that for 3 months - she was down to 167 lbs. She has gained her weight back. She has been seeing Gina at [facility] and is on the lowest dose of prozac . She has taken the test for ADHD but found out likely menopause. She is seeing Ortho for her right knee for possible tear due for an MRI next week. She has been referred to rheumatology, her test showed possible autoimmune.       Past Medical History:  Diagnosis Date   Allergy In childhood   Anxiety    Arthritis 2023   Depression On and off for more than 10 years   GERD (gastroesophageal reflux disease) More than 10 years ago   Heart murmur N/A   HSV-2 (herpes simplex virus 2) infection    Hyperlipidemia    Otalgia of both ears 01/18/2023     Family History  Problem Relation Age of Onset   Diabetes Mother    Hypertension Mother    Hyperlipidemia Mother    Heart disease Mother    Varicose Veins Mother    Anxiety disorder Mother    Arthritis Mother    Heart attack Father    Bipolar disorder Father    Schizophrenia Father    Hyperlipidemia Father    Alzheimer's disease Father    Cancer Maternal Grandmother    Migraines Maternal Grandmother    Hypertension Maternal Grandmother    Alzheimer's disease Paternal Grandmother    Leukemia Paternal Grandfather    Colon cancer Neg Hx    Colon polyps Neg Hx    Esophageal cancer Neg Hx    Rectal  cancer Neg Hx    Stomach cancer Neg Hx      Current Outpatient Medications:    ALPRAZolam  (XANAX ) 0.5 MG tablet, Take 1 tablet (0.5 mg total) by mouth 2 (two) times daily as needed for anxiety. alprazolam  0.5 mg tablet, Disp: 30 tablet, Rfl: 0   Ascorbic Acid (VITAMIN C) 1000 MG tablet, Take by mouth., Disp: , Rfl:    estradiol (VIVELLE-DOT) 0.075 MG/24HR, Place 1 patch onto the skin daily at 6 (six) AM., Disp: , Rfl:    FLUoxetine  (PROZAC ) 10 MG capsule, Take 1 capsule (10 mg total) by mouth daily., Disp: 90 capsule, Rfl: 1   fluticasone (FLONASE) 50 MCG/ACT nasal spray, , Disp: , Rfl:    Lysine 1000 MG TABS, Take 1,000 mg by mouth daily., Disp: , Rfl:    Magnesium  250 MG TABS, Take 1 tablet (250 mg total) by mouth daily. Take with evening meal, Disp: 90 tablet, Rfl: 1   progesterone (PROMETRIUM) 100 MG capsule, Take 100 mg by mouth daily., Disp: , Rfl:    rosuvastatin  (CRESTOR ) 5 MG tablet, Take 1 tablet (5 mg total) by mouth daily., Disp: 90 tablet, Rfl: 1   traMADol (ULTRAM) 50 MG tablet, Take 50  mg by mouth 4 (four) times daily., Disp: , Rfl:    valACYclovir (VALTREX) 500 MG tablet, Take 500 mg by mouth daily at 6 (six) AM., Disp: , Rfl:    Zinc 20 MG CAPS, Take 1 capsule by mouth daily., Disp: , Rfl:    Vitamin D , Ergocalciferol , (DRISDOL ) 1.25 MG (50000 UNIT) CAPS capsule, Take 1 capsule by mouth once a week, Disp: 12 capsule, Rfl: 1   Allergies  Allergen Reactions   Penicillins Hives   Pork-Derived Products       The patient states she uses status post hysterectomy for birth control. No LMP recorded. Patient has had a hysterectomy. Negative for Dysmenorrhea and Negative for Menorrhagia. Negative for: breast discharge, breast lump(s), breast pain and breast self exam. Associated symptoms include abnormal vaginal bleeding. Pertinent negatives include abnormal bleeding (hematology), anxiety, decreased libido, depression, difficulty falling sleep, dyspareunia, history of infertility,  nocturia, sexual dysfunction, sleep disturbances, urinary incontinence, urinary urgency, vaginal discharge and vaginal itching. Diet regular; feels like her diet is better due to her boyfriend finding out he has congestive heart failure so trying to eat better. The patient states her exercise level is minimal 3 times a week with walking. Most times she has pain to her right knee when sitting to standing position.   The patient's tobacco use is:  Social History   Tobacco Use  Smoking Status Never  Smokeless Tobacco Never  Tobacco Comments   Never used   She has been exposed to passive smoke. The patient's alcohol use is:  Social History   Substance and Sexual Activity  Alcohol Use Yes   Comment: Socially    Review of Systems  Constitutional: Negative.   HENT: Negative.    Eyes: Negative.   Respiratory: Negative.    Cardiovascular: Negative.   Gastrointestinal: Negative.   Endocrine: Negative.   Genitourinary: Negative.   Musculoskeletal: Negative.   Skin: Negative.   Allergic/Immunologic: Negative.   Neurological: Negative.   Hematological: Negative.   Psychiatric/Behavioral: Negative.       Today's Vitals   07/25/23 0856  BP: 120/70  Pulse: 98  Temp: 97.9 F (36.6 C)  TempSrc: Oral  Weight: 183 lb (83 kg)  Height: 5\' 4"  (1.626 m)  PainSc: 0-No pain   Body mass index is 31.41 kg/m.  Wt Readings from Last 3 Encounters:  07/25/23 183 lb (83 kg)  03/10/23 167 lb (75.8 kg)  02/02/23 167 lb (75.8 kg)     Objective:  Physical Exam Vitals and nursing note reviewed.  Constitutional:      General: She is not in acute distress.    Appearance: Normal appearance. She is well-developed. She is obese.  HENT:     Head: Normocephalic and atraumatic.     Right Ear: Hearing, tympanic membrane, ear canal and external ear normal. There is no impacted cerumen.     Left Ear: Hearing, tympanic membrane, ear canal and external ear normal. There is no impacted cerumen.      Nose: Nose normal.     Mouth/Throat:     Mouth: Mucous membranes are moist.  Eyes:     General: Lids are normal.     Extraocular Movements: Extraocular movements intact.     Conjunctiva/sclera: Conjunctivae normal.     Pupils: Pupils are equal, round, and reactive to light.     Funduscopic exam:    Right eye: No papilledema.        Left eye: No papilledema.  Neck:  Thyroid: No thyroid mass.     Vascular: No carotid bruit.  Cardiovascular:     Rate and Rhythm: Normal rate and regular rhythm.     Pulses: Normal pulses.     Heart sounds: Normal heart sounds. No murmur heard. Pulmonary:     Effort: Pulmonary effort is normal. No respiratory distress.     Breath sounds: Normal breath sounds. No wheezing.  Chest:     Chest wall: No mass.  Breasts:    Tanner Score is 5.     Right: Normal. No mass or tenderness.     Left: Normal. No mass or tenderness.  Abdominal:     General: Abdomen is flat. Bowel sounds are normal. There is no distension.     Palpations: Abdomen is soft.     Tenderness: There is no abdominal tenderness.  Musculoskeletal:        General: No swelling. Normal range of motion.     Cervical back: Full passive range of motion without pain, normal range of motion and neck supple.     Right lower leg: No edema.     Left lower leg: No edema.  Lymphadenopathy:     Upper Body:     Right upper body: No supraclavicular, axillary or pectoral adenopathy.     Left upper body: No supraclavicular, axillary or pectoral adenopathy.  Skin:    General: Skin is warm and dry.     Capillary Refill: Capillary refill takes less than 2 seconds.  Neurological:     General: No focal deficit present.     Mental Status: She is alert and oriented to person, place, and time.     Cranial Nerves: No cranial nerve deficit.     Sensory: No sensory deficit.     Motor: No weakness.  Psychiatric:        Mood and Affect: Mood normal.        Behavior: Behavior normal.        Thought Content:  Thought content normal.        Judgment: Judgment normal.        07/25/2023    9:12 AM 07/15/2022    8:52 AM 06/01/2022   12:21 PM  GAD 7 : Generalized Anxiety Score  Nervous, Anxious, on Edge 1 1 3   Control/stop worrying 0 1 1  Worry too much - different things 0 1 1  Trouble relaxing 0 0 0  Restless 0 0 1  Easily annoyed or irritable 1 1 1   Afraid - awful might happen 0 0 0  Total GAD 7 Score 2 4 7   Anxiety Difficulty Not difficult at all Not difficult at all Not difficult at all       07/25/2023    9:11 AM 01/12/2023   11:33 AM 07/15/2022    8:51 AM 06/01/2022   12:19 PM 06/01/2022   12:18 PM  Depression screen PHQ 2/9  Decreased Interest 0 0 1 1 1   Down, Depressed, Hopeless 0 0 1 2 2   PHQ - 2 Score 0 0 2 3 3   Altered sleeping 0 0 0 1   Tired, decreased energy 0 0 0 1   Change in appetite 0 0 0 1   Feeling bad or failure about yourself  0 0 0 0   Trouble concentrating 3 0 0 2   Moving slowly or fidgety/restless 0 0 0 0   Suicidal thoughts 0 0 0 0   PHQ-9 Score 3 0 2 8  Difficult doing work/chores Somewhat difficult Not difficult at all Not difficult at all Somewhat difficult      Assessment And Plan:     Encounter for annual health examination Assessment & Plan: Behavior modifications discussed and diet history reviewed.   Pt will continue to exercise regularly and modify diet with low GI, plant based foods and decrease intake of processed foods.  Recommend intake of daily multivitamin, Vitamin D , and calcium .  Recommend mammogram and colonoscopy for preventive screenings, as well as recommend immunizations that include influenza, TDAP    Mixed hyperlipidemia Assessment & Plan: Cholesterol levels were slightly improved at last visit.  Continue statin, tolerating well.  Encouraged to eat a low-fat diet and to increase her fiber intake.  Orders: -     CMP14+EGFR -     Lipid panel  Vitamin D  deficiency Assessment & Plan: Will check vitamin D  level and supplement  as needed.    Also encouraged to spend 15 minutes in the sun daily.    Orders: -     VITAMIN D  25 Hydroxy (Vit-D Deficiency, Fractures)  Anxiety Assessment & Plan: Her anxiety level has improved since last year. Continue f/u with Bonnell Butcher at [facility]   Mild episode of recurrent major depressive disorder [facility]) Assessment & Plan: Her depression score is 3 but she feels like she is doing well.    COVID-19 vaccination declined Assessment & Plan: Declines covid 19 vaccine. Discussed risk of covid 47 and if she changes her mind about the vaccine to call the office. Education has been provided regarding the importance of this vaccine but patient still declined. Advised may receive this vaccine at local pharmacy or Health Dept.or vaccine clinic. Aware to provide a copy of the vaccination record if obtained from local pharmacy or Health Dept.  Encouraged to take multivitamin, vitamin d , vitamin c and zinc to increase immune system. Aware can call office if would like to have vaccine here at office. Verbalized acceptance and understanding.    Class 1 obesity due to excess calories without serious comorbidity with body mass index (BMI) of 31.0 to 31.9 in adult Assessment & Plan: She is no longer taking the compounding GLP1, encouraged to increase physical activity and eat a healthy diet. I did discuss cash pay options for Zepbound and Wegovy     Other long term (current) drug therapy -     CBC with Differential/Platelet   Return for 1 year physical, 6 month chol check. Patient was given opportunity to ask questions. Patient verbalized understanding of the plan and was able to repeat key elements of the plan. All questions were answered to their satisfaction.   Susanna Epley, FNP  I, Susanna Epley, FNP, have reviewed all documentation for this visit. The documentation on 07/25/23 for the exam, diagnosis, procedures, and orders are all accurate and complete.

## 2023-07-26 ENCOUNTER — Other Ambulatory Visit: Payer: Self-pay | Admitting: Nurse Practitioner

## 2023-07-26 ENCOUNTER — Ambulatory Visit: Payer: Self-pay | Admitting: Nurse Practitioner

## 2023-07-26 DIAGNOSIS — E559 Vitamin D deficiency, unspecified: Secondary | ICD-10-CM

## 2023-07-26 LAB — CBC WITH DIFFERENTIAL/PLATELET
Basophils Absolute: 0 10*3/uL (ref 0.0–0.2)
Basos: 1 %
EOS (ABSOLUTE): 0.1 10*3/uL (ref 0.0–0.4)
Eos: 2 %
Hematocrit: 43.9 % (ref 34.0–46.6)
Hemoglobin: 14.5 g/dL (ref 11.1–15.9)
Immature Grans (Abs): 0 10*3/uL (ref 0.0–0.1)
Immature Granulocytes: 0 %
Lymphocytes Absolute: 2.3 10*3/uL (ref 0.7–3.1)
Lymphs: 36 %
MCH: 30.3 pg (ref 26.6–33.0)
MCHC: 33 g/dL (ref 31.5–35.7)
MCV: 92 fL (ref 79–97)
Monocytes Absolute: 0.4 10*3/uL (ref 0.1–0.9)
Monocytes: 6 %
Neutrophils Absolute: 3.5 10*3/uL (ref 1.4–7.0)
Neutrophils: 55 %
Platelets: 308 10*3/uL (ref 150–450)
RBC: 4.79 x10E6/uL (ref 3.77–5.28)
RDW: 12.1 % (ref 11.7–15.4)
WBC: 6.2 10*3/uL (ref 3.4–10.8)

## 2023-07-26 LAB — CMP14+EGFR
ALT: 22 IU/L (ref 0–32)
AST: 24 IU/L (ref 0–40)
Albumin: 4.8 g/dL (ref 3.9–4.9)
Alkaline Phosphatase: 86 IU/L (ref 44–121)
BUN/Creatinine Ratio: 21 (ref 9–23)
BUN: 13 mg/dL (ref 6–24)
Bilirubin Total: 0.5 mg/dL (ref 0.0–1.2)
CO2: 24 mmol/L (ref 20–29)
Calcium: 9.6 mg/dL (ref 8.7–10.2)
Chloride: 102 mmol/L (ref 96–106)
Creatinine, Ser: 0.61 mg/dL (ref 0.57–1.00)
Globulin, Total: 2 g/dL (ref 1.5–4.5)
Glucose: 80 mg/dL (ref 70–99)
Potassium: 4.7 mmol/L (ref 3.5–5.2)
Sodium: 142 mmol/L (ref 134–144)
Total Protein: 6.8 g/dL (ref 6.0–8.5)
eGFR: 110 mL/min/{1.73_m2} (ref >59)

## 2023-07-26 LAB — LIPID PANEL
Chol/HDL Ratio: 3.4 ratio (ref 0.0–4.4)
Cholesterol, Total: 195 mg/dL (ref 100–199)
HDL: 58 mg/dL (ref >39)
LDL Chol Calc (NIH): 124 mg/dL — ABNORMAL HIGH (ref 0–99)
Triglycerides: 70 mg/dL (ref 0–149)
VLDL Cholesterol Cal: 13 mg/dL (ref 5–40)

## 2023-07-26 LAB — VITAMIN D 25 HYDROXY (VIT D DEFICIENCY, FRACTURES): Vit D, 25-Hydroxy: 32.3 ng/mL (ref 30.0–100.0)

## 2023-07-26 MED ORDER — VITAMIN D (ERGOCALCIFEROL) 1.25 MG (50000 UNIT) PO CAPS: ORAL_CAPSULE | ORAL | 1 refills | Status: DC

## 2023-07-26 NOTE — Assessment & Plan Note (Signed)
 Her anxiety level has improved since last year.

## 2023-07-26 NOTE — Assessment & Plan Note (Signed)
 Cholesterol levels were slightly improved at last visit.  Continue statin, tolerating well.  Encouraged to eat a low-fat diet and to increase her fiber intake.

## 2023-07-26 NOTE — Assessment & Plan Note (Signed)
 Will check vitamin D level and supplement as needed.    Also encouraged to spend 15 minutes in the sun daily.

## 2023-07-27 ENCOUNTER — Encounter: Payer: Self-pay | Admitting: Nurse Practitioner

## 2023-07-27 DIAGNOSIS — Z0000 Encounter for general adult medical examination without abnormal findings: Secondary | ICD-10-CM | POA: Insufficient documentation

## 2023-07-27 DIAGNOSIS — Z2821 Immunization not carried out because of patient refusal: Secondary | ICD-10-CM | POA: Insufficient documentation

## 2023-07-27 NOTE — Assessment & Plan Note (Addendum)
 She is no longer taking the compounding GLP1, encouraged to increase physical activity and eat a healthy diet. I did discuss cash pay options for Zepbound and Wegovy 

## 2023-07-27 NOTE — Assessment & Plan Note (Signed)
 Her depression score is 3 but she feels like she is doing well.

## 2023-07-27 NOTE — Assessment & Plan Note (Signed)

## 2023-07-27 NOTE — Assessment & Plan Note (Signed)

## 2023-08-08 ENCOUNTER — Ambulatory Visit: Admitting: Adult Health

## 2023-08-09 ENCOUNTER — Encounter: Payer: Self-pay | Admitting: Nurse Practitioner

## 2023-08-09 ENCOUNTER — Ambulatory Visit: Admitting: Adult Health

## 2023-08-09 ENCOUNTER — Encounter: Payer: Self-pay | Admitting: Adult Health

## 2023-08-09 DIAGNOSIS — F4310 Post-traumatic stress disorder, unspecified: Secondary | ICD-10-CM

## 2023-08-09 DIAGNOSIS — F410 Panic disorder [episodic paroxysmal anxiety] without agoraphobia: Secondary | ICD-10-CM | POA: Diagnosis not present

## 2023-08-09 DIAGNOSIS — F331 Major depressive disorder, recurrent, moderate: Secondary | ICD-10-CM | POA: Diagnosis not present

## 2023-08-09 DIAGNOSIS — F411 Generalized anxiety disorder: Secondary | ICD-10-CM | POA: Diagnosis not present

## 2023-08-09 MED ORDER — FLUOXETINE HCL 10 MG PO CAPS
10.0000 mg | ORAL_CAPSULE | Freq: Every day | ORAL | 1 refills | Status: DC
Start: 2023-08-09 — End: 2023-12-20

## 2023-08-09 NOTE — Progress Notes (Signed)
 Patricia French [id number] 23-Oct-1975 48 y.o.  Subjective:   Patient ID:  Patricia French is a 48 y.o. (DOB 1975/09/10) female.  Chief Complaint: No chief complaint on file.   HPI [name] presents to the office today for follow-up of MDD, GAD, PTSD, panic attacks,   Referred by PCP Susanna Epley - PCP.  Describes mood today as ok. Pleasant. Denies tearfulness. Mood symptoms - denies anxiety, depression and irritability. Reports improved interest and motivation. Reports decreased panic attacks - calmer. Reports decreased worry, rumination and over thinking. Reports mood is stable. Stating I feel like I'm doing ok. Taking medications as prescribed.  Energy levels stable. Active, does not have a regular exercise routine.  Enjoys some usual interests and activities. Divorced. Engaged. Has 3 children - 3 grandchildren. Spending time with family. Appetite adequate. Weight gain - 180 pounds. Sleeping better at night - TMJ wears mouth guard.  Averages 7 hours.  Reports focus and concentration difficulties. Completing tasks. Managing aspects of household. Works full time for [employer].  Denies SI or HI. Reports a suicide attempt in high school - over dosed on tylenol . Denies AH or VH. Denies self harm. Denies substance use  Previous medication trials:  Xanax , Lorazepam , Wellbutrin , Trintellix    GAD-7    Flowsheet Row Office Visit from 07/25/2023 in [facility] Office Visit from 07/15/2022 in [facility] Office Visit from 06/01/2022 in [facility]  Total GAD-7 Score 2 4 7    PHQ2-9    Flowsheet Row Office Visit from 07/25/2023 in [facility] Office Visit from 01/12/2023 in [facility] Office Visit from 07/15/2022 in [facility] Office Visit from 06/01/2022 in [facility]  [facility] Office Visit from 02/25/2022 in [facility]  PHQ-2 Total Score 0 0 2 3 1   PHQ-9 Total Score 3 0 2 8 9    Flowsheet Row ED from 02/02/2023 in [facility] Emergency Department at [facility] ED from 04/28/2022 in [facility] Emergency Department at [facility]  C-SSRS RISK CATEGORY No Risk No Risk     Review of Systems:  Review of Systems  Musculoskeletal:  Negative for gait problem.  Neurological:  Negative for tremors.  Psychiatric/Behavioral:         Please refer to HPI    Medications: I have reviewed the patient's current medications.  Current Outpatient Medications  Medication Sig Dispense Refill   ALPRAZolam  (XANAX ) 0.5 MG tablet Take 1 tablet (0.5 mg total) by mouth 2 (two) times daily as needed for anxiety. alprazolam  0.5 mg tablet 30 tablet 0   Ascorbic Acid (VITAMIN C) 1000 MG tablet Take by mouth.     estradiol (VIVELLE-DOT) 0.075 MG/24HR Place 1 patch onto the skin daily at 6 (six) AM.     FLUoxetine  (PROZAC ) 10 MG capsule Take 1 capsule (10 mg total) by mouth daily. 90 capsule 1   fluticasone (FLONASE) 50 MCG/ACT nasal spray      Lysine 1000 MG TABS Take 1,000 mg by mouth daily.     Magnesium  250 MG TABS Take 1 tablet (250 mg total) by mouth daily. Take with evening meal 90 tablet 1   progesterone (PROMETRIUM) 100 MG capsule Take 100 mg by mouth daily.     rosuvastatin  (CRESTOR ) 5 MG tablet Take 1 tablet (5 mg total) by mouth daily. 90 tablet 1  traMADol (ULTRAM) 50 MG tablet Take 50 mg by mouth 4 (four) times daily.     valACYclovir (VALTREX) 500 MG tablet Take 500 mg by mouth daily at 6 (six) AM.     Vitamin D , Ergocalciferol , (DRISDOL ) 1.25 MG (50000 UNIT) CAPS capsule Take 1 capsule by mouth once a week 12 capsule 1   Zinc 20 MG CAPS Take 1 capsule by mouth daily.     No current facility-administered medications for this visit.    Medication Side Effects: None  Allergies:  Allergies   Allergen Reactions   Penicillins Hives   Pork-Derived Products     Past Medical History:  Diagnosis Date   Allergy In childhood   Anxiety    Arthritis 2023   Depression On and off for more than 10 years   GERD (gastroesophageal reflux disease) More than 10 years ago   Heart murmur N/A   HSV-2 (herpes simplex virus 2) infection    Hyperlipidemia    Otalgia of both ears 01/18/2023    Past Medical History, Surgical history, Social history, and Family history were reviewed and updated as appropriate.   Please see review of systems for further details on the patient's review from today.   Objective:   Physical Exam:  There were no vitals taken for this visit.  Physical Exam Constitutional:      General: She is not in acute distress.  Musculoskeletal:        General: No deformity.   Neurological:     Mental Status: She is alert and oriented to person, place, and time.     Coordination: Coordination normal.   Psychiatric:        Attention and Perception: Attention and perception normal. She does not perceive auditory or visual hallucinations.        Mood and Affect: Mood normal. Mood is not anxious or depressed. Affect is not labile, blunt, angry or inappropriate.        Speech: Speech normal.        Behavior: Behavior normal.        Thought Content: Thought content normal. Thought content is not paranoid or delusional. Thought content does not include homicidal or suicidal ideation. Thought content does not include homicidal or suicidal plan.        Cognition and Memory: Cognition and memory normal.        Judgment: Judgment normal.     Comments: Insight intact     Lab Review:     Component Value Date/Time   NA 142 07/25/2023 1008   K 4.7 07/25/2023 1008   CL 102 07/25/2023 1008   CO2 24 07/25/2023 1008   GLUCOSE 80 07/25/2023 1008   GLUCOSE 90 02/02/2023 1709   BUN 13 07/25/2023 1008   CREATININE 0.61 07/25/2023 1008   CALCIUM  9.6 07/25/2023 1008   PROT 6.8  07/25/2023 1008   ALBUMIN 4.8 07/25/2023 1008   AST 24 07/25/2023 1008   ALT 22 07/25/2023 1008   ALKPHOS 86 07/25/2023 1008   BILITOT 0.5 07/25/2023 1008   GFRNONAA >60 02/02/2023 1709       Component Value Date/Time   WBC 6.2 07/25/2023 1008   WBC 11.9 (H) 02/02/2023 1709   RBC 4.79 07/25/2023 1008   RBC 5.13 (H) 02/02/2023 1709   HGB 14.5 07/25/2023 1008   HCT 43.9 07/25/2023 1008   PLT 308 07/25/2023 1008   MCV 92 07/25/2023 1008   MCH 30.3 07/25/2023 1008   MCH 29.6 02/02/2023 1709  MCHC 33.0 07/25/2023 1008   MCHC 33.5 02/02/2023 1709   RDW 12.1 07/25/2023 1008   LYMPHSABS 2.3 07/25/2023 1008   MONOABS 0.7 10/22/2020 1631   EOSABS 0.1 07/25/2023 1008   BASOSABS 0.0 07/25/2023 1008    No results found for: POCLITH, LITHIUM   No results found for: PHENYTOIN, PHENOBARB, VALPROATE, CBMZ   .res Assessment: Plan:    Plan:  PDMP reviewed  Prozac  10mg  daily for mood symptoms.  RTC 6 months  25 minutes spent dedicated to the care of this patient on the date of this encounter to include pre-visit review of records, ordering of medication, post visit documentation, and face-to-face time with the patient discussing depression, anxiety, panic attacks and PTSD. Discussed continuing current medication regimen.  Patient advised to contact office with any questions, adverse effects, or acute worsening in signs and symptoms.  There are no diagnoses linked to this encounter.   Please see After Visit Summary for patient specific instructions.  Future Appointments  Date Time Provider Department Center  09/22/2023  2:20 PM [facility] 2 [facility]  01/24/2024  8:20 AM Susanna Epley, FNP [facility] None  07/25/2024  8:20 AM Susanna Epley, FNP [facility] None    No orders of the defined types were placed in this encounter.   -------------------------------

## 2023-08-10 ENCOUNTER — Other Ambulatory Visit: Payer: Self-pay | Admitting: Nurse Practitioner

## 2023-08-10 DIAGNOSIS — E66811 Obesity, class 1: Secondary | ICD-10-CM

## 2023-08-10 DIAGNOSIS — G2581 Restless legs syndrome: Secondary | ICD-10-CM

## 2023-09-12 ENCOUNTER — Ambulatory Visit: Admitting: Orthopedic Surgery

## 2023-09-12 ENCOUNTER — Encounter: Payer: Self-pay | Admitting: Orthopedic Surgery

## 2023-09-12 DIAGNOSIS — S83231A Complex tear of medial meniscus, current injury, right knee, initial encounter: Secondary | ICD-10-CM | POA: Diagnosis not present

## 2023-09-12 NOTE — Progress Notes (Signed)
 Office Visit Note   Patient: Patricia French           Date of Birth: May 21, 1975           MRN: [id number] Visit Date: 09/12/2023 Requested by: Georgina Speaks, FNP [address] [place],  [place] [address] PCP: Georgina Speaks, FNP  Subjective: Chief Complaint  Patient presents with   Right Knee - Pain    HPI: Patricia French is a 48 y.o. female who presents to the office reporting right knee pain since December 2024.  Does not report a discrete injury but has had increasingly debilitating pain mechanical symptoms and swelling in that right knee.  She describes locking popping weakness and giving way.  Does have stiffness after rest.  No prior right knee surgery.  Has not had any injections yet but she does use a brace.  Also takes Celebrex  and Ultram.  No personal history of pulmonary embolism or DVT but her mother does have a history of DVT.  Increasing angles of flexion do hurt.  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  MRI scan has been performed.  This does show radial tear of the medial meniscus involving about 50% of its meniscal volume.  Small effusion with mild to minimal medial and patellofemoral chondromalacia..                ROS: All systems reviewed are negative as they relate to the chief complaint within the history of present illness.  Patient denies fevers or chills.  Assessment & Plan: Visit Diagnoses:  1. Complex tear of medial meniscus of right knee as current injury, initial encounter     Plan: Impression is symptomatic right knee medial meniscal tear..  Plan is right knee arthroscopy and debridement.  Patient has been doing exercises.  She has excellent strength and range of motion.  She does report continued medial sided pain with mechanical symptoms.  Has identifiable pathology on the MRI scan.  Symptoms ongoing for 7 months.  Plan is as described.  The risk and benefits are discussed with the patient including not limited  to infection nerve vessel damage incomplete pain relief as well as incomplete functional restoration.  The expected nature of the rehabilitative process is also discussed.  All questions answered.  Follow-Up Instructions: No follow-ups on file.   Orders:  No orders of the defined types were placed in this encounter.  No orders of the defined types were placed in this encounter.     Procedures: No procedures performed   Clinical Data: No additional findings.  Objective: Vital Signs: There were no vitals taken for this visit.  Physical Exam:  Constitutional: Patient appears well-developed HEENT:  Head: Normocephalic Eyes:EOM are normal Neck: Normal range of motion Cardiovascular: Normal rate Pulmonary/chest: Effort normal Neurologic: Patient is alert Skin: Skin is warm Psychiatric: Patient has normal mood and affect  Ortho Exam: Ortho exam demonstrates trace effusion in the right knee.  Does have medial greater than lateral joint.  Collateral and cruciate ligaments are stable.  No masses lymphadenopathy or skin changes noted in that region.  No groin pain with internal or external rotation of the leg.  Specialty Comments:  No specialty comments available.  Imaging: No results found.   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   Tension-type headache, not intractable 10/30/2019   Vitamin D  deficiency 04/03/2018   History of  hysterectomy 07/15/2017   Fibroid 07/02/2013   Menorrhagia 07/02/2013   Past Medical History:  Diagnosis Date   Allergy In childhood   Anxiety    Arthritis 2023   Depression On and off for more than 10 years   GERD (gastroesophageal reflux disease) More than 10 years ago   Heart murmur N/A   HSV-2 (herpes simplex virus 2) infection    Hyperlipidemia    Otalgia of both ears 01/18/2023    Family History  Problem Relation Age of Onset   Diabetes Mother    Hypertension Mother    Hyperlipidemia Mother    Heart disease Mother    Varicose Veins Mother    Anxiety disorder Mother    Arthritis Mother    Heart attack Father    Bipolar disorder Father    Schizophrenia Father    Hyperlipidemia Father    Alzheimer's disease Father    Cancer Maternal Grandmother    Migraines Maternal Grandmother    Hypertension Maternal Grandmother    Alzheimer's disease Paternal Grandmother    Leukemia Paternal Grandfather    Colon cancer Neg Hx    Colon polyps Neg Hx    Esophageal cancer Neg Hx    Rectal cancer Neg Hx    Stomach cancer Neg Hx     Past Surgical History:  Procedure Laterality Date   ABDOMINAL HYSTERECTOMY  2015   BREAST BIOPSY Left 09/10/2022   MM LT BREAST BX W LOC DEV 1ST LESION IMAGE BX SPEC STEREO GUIDE 09/10/2022 [facility]   CESAREAN SECTION     CHOLECYSTECTOMY     COSMETIC SURGERY  2015 tummy tuck   EYE SURGERY  More than 15 years ago   keloid removal     PARTIAL HYSTERECTOMY     TONSILLECTOMY     TUBAL LIGATION  2012   tummy tuck     Social History   Occupational History   Not on file  Tobacco Use   Smoking status: Never   Smokeless tobacco: Never   Tobacco comments:    Never used  Vaping Use   Vaping status: Never Used  Substance and Sexual Activity   Alcohol use: Yes    Comment: Socially   Drug use: No   Sexual activity: Yes    Birth control/protection: Condom

## 2023-09-14 ENCOUNTER — Encounter: Payer: Self-pay | Admitting: Neurology

## 2023-09-14 ENCOUNTER — Ambulatory Visit: Admitting: Neurology

## 2023-09-14 VITALS — BP 149/85 | HR 79 | Ht 63.0 in | Wt 188.0 lb

## 2023-09-14 DIAGNOSIS — R41840 Attention and concentration deficit: Secondary | ICD-10-CM

## 2023-09-14 DIAGNOSIS — E669 Obesity, unspecified: Secondary | ICD-10-CM

## 2023-09-14 DIAGNOSIS — G4719 Other hypersomnia: Secondary | ICD-10-CM

## 2023-09-14 DIAGNOSIS — G4761 Periodic limb movement disorder: Secondary | ICD-10-CM | POA: Diagnosis not present

## 2023-09-14 DIAGNOSIS — R0683 Snoring: Secondary | ICD-10-CM

## 2023-09-14 DIAGNOSIS — R519 Headache, unspecified: Secondary | ICD-10-CM

## 2023-09-14 DIAGNOSIS — Z9189 Other specified personal risk factors, not elsewhere classified: Secondary | ICD-10-CM

## 2023-09-14 DIAGNOSIS — R351 Nocturia: Secondary | ICD-10-CM

## 2023-09-14 NOTE — Patient Instructions (Signed)

## 2023-09-14 NOTE — Progress Notes (Addendum)
 Subjective:    Patient ID: Patricia French is a 48 y.o. female.  HPI    True Mar, MD, PhD [facility] [address], [address] [address] [place], [place] [address]  Dear Gaines,  I saw your patient, Patricia French, upon your kind request in my sleep clinic today for initial consultation of her sleep disorder, in particular, concern for underlying obstructive sleep apnea and restless leg syndrome.  The patient is accompanied by her Fiance today.  As you know, Ms. Ang is a 48 year old female with an underlying medical history of vitamin D  deficiency, allergies, arthritis, reflux disease, hyperlipidemia, anxiety, depression, and obesity, who reports snoring and excessive daytime somnolence.   Her Epworth sleepiness score is 9 out of 24, fatigue severity score is 19 out of 63.  She had recent evaluation for ADHD or ADD with testing, she was not diagnosed with ADHD or ADD but was noted to be physically restless.  She denies any telltale symptoms of restless leg syndrome as such but for the longest time, even back in childhood, she has had leg movements and twitching during sleep.  She is not aware of any family history of sleep apnea but her mom had a sleep study in the past.  Mom passed away a little over a year and a half ago.  Patient has been on Prozac  for the past year, essentially since her mom passed away.  Prior to that, she did try Lexapro and had twitching from it and stopped it fairly quickly. She does not wake up fully rested, she does sleep through the night with the exception of having nocturia about 2-3 times per average night, has rare morning headaches, particularly during allergy season.  She goes to bed generally around 11 and rise time is 6:30 AM.  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  She lives with her fianc and her 55 year old son, no pets in the household.  She does have a TV in the bedroom but turns it off before  falling asleep.  She has TMJ issues and tooth grinding, she uses an occlusal guard. I reviewed your office note from 07/25/2023.  Her Past Medical History Is Significant For: Past Medical History:  Diagnosis Date   Allergy In childhood   Anxiety    Arthritis 2023   Depression On and off for more than 10 years   GERD (gastroesophageal reflux disease) More than 10 years ago   Heart murmur N/A   HSV-2 (herpes simplex virus 2) infection    Hyperlipidemia    Otalgia of both ears 01/18/2023    Her Past Surgical History Is Significant For: Past Surgical History:  Procedure Laterality Date   ABDOMINAL HYSTERECTOMY  2015   BREAST BIOPSY Left 09/10/2022   MM LT BREAST BX W LOC DEV 1ST LESION IMAGE BX SPEC STEREO GUIDE 09/10/2022 [facility]   CESAREAN SECTION     CHOLECYSTECTOMY     COSMETIC SURGERY  2015 tummy tuck   EYE SURGERY  More than 15 years ago   keloid removal     PARTIAL HYSTERECTOMY     TONSILLECTOMY     TUBAL LIGATION  2012   tummy tuck      Her Family History Is Significant For: Family History  Problem Relation Age of Onset   Diabetes Mother    Hypertension Mother    Hyperlipidemia Mother    Heart disease Mother    Varicose Veins Mother    Anxiety  disorder Mother    Arthritis Mother    Heart attack Father    Bipolar disorder Father    Schizophrenia Father    Hyperlipidemia Father    Alzheimer's disease Father    Cancer Maternal Grandmother    Migraines Maternal Grandmother    Hypertension Maternal Grandmother    Alzheimer's disease Paternal Grandmother    Leukemia Paternal Grandfather    Colon cancer Neg Hx    Colon polyps Neg Hx    Esophageal cancer Neg Hx    Rectal cancer Neg Hx    Stomach cancer Neg Hx    Sleep apnea Neg Hx     Her Social History Is Significant For: Social History   Socioeconomic History   Marital status: Divorced    Spouse name: Not on file   Number of children: Not on file   Years of education: Not on file   Highest  education level: Not on file  Occupational History   Not on file  Tobacco Use   Smoking status: Never   Smokeless tobacco: Never   Tobacco comments:    Never used  Vaping Use   Vaping status: Never Used  Substance and Sexual Activity   Alcohol use: Yes    Comment: Socially   Drug use: No   Sexual activity: Yes    Birth control/protection: Condom  Other Topics Concern   Not on file  Social History Narrative   Pt works    Pt lives with family    Social Drivers of Corporate investment banker Strain: Not on file  Food Insecurity: Not on file  Transportation Needs: Not on file  Physical Activity: Not on file  Stress: Not on file  Social Connections: Not on file    Her Allergies Are:  Allergies  Allergen Reactions   Penicillins Hives   Pork-Derived Products   :   Her Current Medications Are:  Outpatient Encounter Medications as of 09/14/2023  Medication Sig   ALPRAZolam  (XANAX ) 0.5 MG tablet Take 1 tablet (0.5 mg total) by mouth 2 (two) times daily as needed for anxiety. alprazolam  0.5 mg tablet   Ascorbic Acid (VITAMIN C) 1000 MG tablet Take by mouth.   estradiol (VIVELLE-DOT) 0.075 MG/24HR Place 1 patch onto the skin daily at 6 (six) AM.   FLUoxetine  (PROZAC ) 10 MG capsule Take 1 capsule (10 mg total) by mouth daily.   fluticasone (FLONASE) 50 MCG/ACT nasal spray    Lysine 1000 MG TABS Take 1,000 mg by mouth daily.   Magnesium  250 MG TABS Take 1 tablet (250 mg total) by mouth daily. Take with evening meal   progesterone (PROMETRIUM) 100 MG capsule Take 100 mg by mouth daily.   rosuvastatin  (CRESTOR ) 5 MG tablet Take 1 tablet (5 mg total) by mouth daily.   traMADol (ULTRAM) 50 MG tablet Take 50 mg by mouth 4 (four) times daily.   valACYclovir (VALTREX) 500 MG tablet Take 500 mg by mouth daily at 6 (six) AM.   Vitamin D , Ergocalciferol , (DRISDOL ) 1.25 MG (50000 UNIT) CAPS capsule Take 1 capsule by mouth once a week   Zinc 20 MG CAPS Take 1 capsule by mouth daily.   No  facility-administered encounter medications on file as of 09/14/2023.  :   Review of Systems:  Out of a complete 14 point review of systems, all are reviewed and negative with the exception of these symptoms as listed below:  Review of Systems  Neurological:  Pt here for sleep consult Pt snores,fatigue Pt denies sleep study,cpap machine,headaches hypertension     ESS:9  FSS     Objective:  Neurological Exam  Physical Exam Physical Examination:   Vitals:   09/14/23 1417  BP: (!) 149/85  Pulse: 79    General Examination: The patient is a very pleasant 48 y.o. female in no acute distress. She appears well-developed and well-nourished and well groomed.   HEENT: Normocephalic, atraumatic, pupils are equal, round and reactive to light, extraocular tracking is good without limitation to gaze excursion or nystagmus noted. Hearing is grossly intact. Face is symmetric with normal facial animation. Speech is clear with no dysarthria noted. There is no hypophonia. There is no lip, neck/head, jaw or voice tremor. Neck is supple with full range of passive and active motion. There are no carotid bruits on auscultation. Oropharynx exam reveals: No significant mouth dryness, good dental hygiene, mild airway crowding secondary to small airway entry, tonsils absent, elongated tongue.  Tongue protrudes centrally and palate elevates symmetrically, Mallampati class III, neck circumference 14-7/8 inches.  She has a mild to moderate overbite.   Chest: Clear to auscultation without wheezing, rhonchi or crackles noted.  Heart: S1+S2+0, regular, with 3/6 systolic murmur noted, not new per patient.    Abdomen: Soft, non-tender and non-distended.  Extremities: There is no pitting edema in the distal lower extremities bilaterally.   Skin: Warm and dry without trophic changes noted.   Musculoskeletal: exam reveals no obvious joint deformities.   Neurologically:  Mental status: The patient is  awake, alert and oriented in all 4 spheres. Her immediate and remote memory, attention, language skills and fund of knowledge are appropriate. There is no evidence of aphasia, agnosia, apraxia or anomia. Speech is clear with normal prosody and enunciation. Thought process is linear. Mood is normal and affect is normal.  Cranial nerves II - XII are as described above under HEENT exam.  Motor exam: Normal bulk, strength and tone is noted. There is no obvious action or resting tremor.  Fine motor skills and coordination: grossly intact.  Cerebellar testing: No dysmetria or intention tremor. There is no truncal or gait ataxia.  Sensory exam: intact to light touch in the upper and lower extremities.  Gait, station and balance: She stands upeasily. No veering to one side is noted. No leaning to one side is noted. Posture is age-appropriate and stance is narrow based. Gait shows normal stride length and normal pace. No problems turning are noted.   Assessment and Plan:  In summary, Mckinzey Entwistle is a very pleasant 48 y.o.-year old female with an underlying medical history of vitamin D  deficiency, allergies, arthritis, reflux disease, hyperlipidemia, anxiety, depression, and obesity, whose history and physical exam are concerning for sleep disordered breathing, particularly obstructive sleep apnea (OSA). A laboratory attended sleep study is typically considered gold standard for evaluation of sleep disordered breathing.   I had a long chat with the patient and her fianc about my findings and the diagnosis of sleep apnea, particularly OSA, its prognosis and treatment options. We talked about medical/conservative treatments, surgical interventions and non-pharmacological approaches for symptom control. I explained, in particular, the risks and ramifications of untreated moderate to severe OSA, especially with respect to developing cardiovascular disease down the road, including congestive heart failure (CHF),  difficult to treat hypertension, cardiac arrhythmias (particularly A-fib), neurovascular complications including TIA, stroke and dementia. Even type 2 diabetes has, in part, been linked to untreated OSA. Symptoms of untreated OSA may  include (but may not be limited to) daytime sleepiness, nocturia (i.e. frequent nighttime urination), memory problems, mood irritability and suboptimally controlled or worsening mood disorder such as depression and/or anxiety, lack of energy, lack of motivation, physical discomfort, as well as recurrent headaches, especially morning or nocturnal headaches. We talked about the importance of maintaining a healthy lifestyle and striving for healthy weight. In addition, we talked about the importance of striving for and maintaining good sleep hygiene. I recommended a sleep study at this time. I outlined the differences between a laboratory attended sleep study which is considered more comprehensive and accurate over the option of a home sleep test (HST); the latter may lead to underestimation of sleep disordered breathing in some instances and does not help with diagnosing upper airway resistance syndrome and is not accurate enough to diagnose primary central sleep apnea typically. I outlined possible surgical and non-surgical treatment options of OSA, including the use of a positive airway pressure (PAP) device (i.e. CPAP, AutoPAP/APAP or BiPAP in certain circumstances), a custom-made dental device (aka oral appliance, which would require a referral to a specialist dentist or orthodontist typically, and is generally speaking not considered for patients with full dentures or edentulous state), upper airway surgical options, such as traditional UPPP (which is not considered a first-line treatment) or the Inspire device (hypoglossal nerve stimulator, which would involve a referral for consultation with an ENT surgeon, after careful selection, following inclusion criteria - also not  first-line treatment). I explained the PAP treatment option to the patient in detail, as this is generally considered first-line treatment.  The patient indicated that she would be willing to try PAP therapy, if the need arises. I explained the importance of being compliant with PAP treatment, not only for insurance purposes but primarily to improve patient's symptoms symptoms, and for the patient's long term health benefit, including to reduce Her cardiovascular risks longer-term.    We will pick up our discussion about the next steps and treatment options after testing.  We will keep her posted as to the test results by phone call and/or MyChart messaging where possible.  We will plan to follow-up in sleep clinic accordingly as well.  I answered all their questions today and the patient and her fianc were in agreement.   I encouraged her to call with any interim questions, concerns, problems or updates or email us  through MyChart.  Generally speaking, sleep test authorizations may take up to 2 weeks, sometimes less, sometimes longer, the patient is encouraged to get in touch with us  if they do not hear back from the sleep lab staff directly within the next 2 weeks.  Thank you very much for allowing me to participate in the care of this nice patient. If I can be of any further assistance to you please do not hesitate to call me at [phone].  Sincerely,   True Mar, MD, PhD

## 2023-09-21 ENCOUNTER — Ambulatory Visit: Payer: Self-pay | Admitting: Family Medicine

## 2023-09-22 ENCOUNTER — Ambulatory Visit
Admission: RE | Admit: 2023-09-22 | Discharge: 2023-09-22 | Disposition: A | Discharge status: Home / Self Care | Source: Ambulatory Visit | Attending: Obstetrics and Gynecology | Admitting: Obstetrics and Gynecology

## 2023-09-22 DIAGNOSIS — Z1231 Encounter for screening mammogram for malignant neoplasm of breast: Secondary | ICD-10-CM

## 2023-09-23 ENCOUNTER — Telehealth: Payer: Self-pay | Admitting: Neurology

## 2023-09-23 NOTE — Telephone Encounter (Signed)
 [employer] pending

## 2023-09-28 ENCOUNTER — Other Ambulatory Visit: Payer: Self-pay | Admitting: Family Medicine

## 2023-09-28 DIAGNOSIS — E782 Mixed hyperlipidemia: Secondary | ICD-10-CM

## 2023-09-28 NOTE — Telephone Encounter (Signed)
 HST no auth req [employer] denied  see below for the denial reason.

## 2023-10-07 ENCOUNTER — Telehealth: Payer: Self-pay | Admitting: Orthopedic Surgery

## 2023-10-07 NOTE — Telephone Encounter (Signed)
 Patient is having right knee arthroscopy with debridement at [facility] with Dr. Addie 11-07-23.  She said Dr. Addie had mentioned she would be on crutches after the surgery. She would like to have the forearm crutches instead of the underarm crutches, stating she had rotator cuff surgery with Dr. Addie back in 2023 and doesn't want to risk causing harm to that shoulder.  She would like to go ahead and have the script for the forearm crutches in the event she does not have an option at [facility]. I suggested patient ask the pre-op nurse if they have this type at [facility] and if not, patient will have the script from our office take to the medical supply and can bring crutches with her on the day of surgery.  Please call patient if there is an issue with the request or call when ready and she will swing by to pick up.  [phone]

## 2023-10-07 NOTE — Telephone Encounter (Signed)
 Okay for prescription.  Thanks

## 2023-10-10 NOTE — Telephone Encounter (Signed)
 Called and notified patient. Script is up front for pick up.

## 2023-10-21 ENCOUNTER — Other Ambulatory Visit: Payer: Self-pay | Admitting: Nurse Practitioner

## 2023-10-21 DIAGNOSIS — E782 Mixed hyperlipidemia: Secondary | ICD-10-CM

## 2023-11-07 ENCOUNTER — Other Ambulatory Visit: Payer: Self-pay | Admitting: Surgical

## 2023-11-07 ENCOUNTER — Encounter: Payer: Self-pay | Admitting: Orthopedic Surgery

## 2023-11-07 DIAGNOSIS — S83241A Other tear of medial meniscus, current injury, right knee, initial encounter: Secondary | ICD-10-CM | POA: Diagnosis not present

## 2023-11-07 MED ORDER — ASPIRIN 81 MG PO CHEW: 81.0000 mg | CHEWABLE_TABLET | Freq: Two times a day (BID) | ORAL | 0 refills | Status: AC

## 2023-11-07 MED ORDER — OXYCODONE HCL 5 MG PO TABS: 5.0000 mg | ORAL_TABLET | ORAL | 0 refills | Status: DC | PRN

## 2023-11-07 MED ORDER — METHOCARBAMOL 500 MG PO TABS: 500.0000 mg | ORAL_TABLET | Freq: Three times a day (TID) | ORAL | 1 refills | Status: DC | PRN

## 2023-11-07 MED ORDER — CELECOXIB 100 MG PO CAPS: 100.0000 mg | ORAL_CAPSULE | Freq: Two times a day (BID) | ORAL | 0 refills | Status: DC

## 2023-11-08 ENCOUNTER — Other Ambulatory Visit: Payer: Self-pay | Admitting: Orthopedic Surgery

## 2023-11-08 ENCOUNTER — Telehealth: Payer: Self-pay | Admitting: Surgical

## 2023-11-08 MED ORDER — HYDROCODONE-ACETAMINOPHEN 5-325 MG PO TABS: 1.0000 | ORAL_TABLET | Freq: Four times a day (QID) | ORAL | 0 refills | Status: DC | PRN

## 2023-11-08 NOTE — Telephone Encounter (Signed)
 Norco sent.

## 2023-11-08 NOTE — Telephone Encounter (Signed)
 Pt states the pain medication Addie gave her makes her sick. Please send new pain medication ASAP. Pt hasn't taken any meds . Please send to [facility]. Please call pt when sent at [phone].

## 2023-11-14 ENCOUNTER — Encounter: Payer: Self-pay | Admitting: Surgical

## 2023-11-14 ENCOUNTER — Ambulatory Visit (INDEPENDENT_AMBULATORY_CARE_PROVIDER_SITE_OTHER): Admitting: Surgical

## 2023-11-14 DIAGNOSIS — Z9889 Other specified postprocedural states: Secondary | ICD-10-CM

## 2023-11-14 NOTE — Progress Notes (Signed)
 Post-Op Visit Note   Patient: Patricia French           Date of Birth: [birth year]           MRN: [id number] Visit Date: 11/14/2023 PCP: Georgina Speaks, FNP   Assessment & Plan:  Chief Complaint:  Chief Complaint  Patient presents with   Right Knee - Routine Post Op    11/07/2023 right knee arthroscopy, PLM, chondroplasty   Visit Diagnoses: No diagnosis found.  Plan:   Post-Op Visit Note   Patient: Patricia French           Date of Birth: 20-Oct-1975           MRN: [id number] Visit Date: 11/14/2023 PCP: Georgina Speaks, FNP   Assessment & Plan:  Chief Complaint:  Chief Complaint  Patient presents with   Right Knee - Routine Post Op    11/07/2023 right knee arthroscopy, PLM, chondroplasty   Visit Diagnoses:  1. S/P right knee arthroscopy     Plan: Patricia French is a 48 y.o. female who presents s/p right knee arthroscopy on 11/07/2023.  Doing well overall.  They deny any calf pain, shortness of breath, chest pain, abdominal pain.  Pain is overall controlled and taking ibuprofen  for pain control.  Taking aspirin  for DVT prophylaxis.  Ambulating with no assistance.   On exam, patient has range of motion 0 degrees extension and 90 degrees of knee flexion.  Incisions are healing well without evidence of infection or dehiscence. Sutures intact and were removed today and replaced with steri-strips.  2+ DP pulse of the operative extremity.  No calf tenderness, negative Homans' sign.  Able to perform straight leg raise.  Intact ankle dorsiflexion. No significant knee effusion  Plan is continue with knee range of motion exercises.  Okay to start stationary bike and walking program.  Follow-up for clinical recheck in 4 weeks with Dr. Addie..    Follow-Up Instructions: No follow-ups on file.   Orders:  No orders of the defined types were placed in this encounter.  No orders of the defined types were placed in this encounter.   Imaging: No results found.  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   Tension-type headache, not intractable 10/30/2019   Vitamin D  deficiency 04/03/2018   History of hysterectomy 07/15/2017   Fibroid 07/02/2013   Menorrhagia 07/02/2013   Past Medical History:  Diagnosis Date   Allergy In childhood   Anxiety    Arthritis 2023   Depression On and off for more than 10 years   GERD (gastroesophageal reflux disease) More than 10 years ago   Heart murmur N/A   HSV-2 (herpes simplex virus 2) infection    Hyperlipidemia    Otalgia of both ears 01/18/2023    Family History  Problem Relation Age of Onset   Diabetes Mother    Hypertension Mother    Hyperlipidemia Mother    Heart disease Mother    Varicose Veins Mother    Anxiety disorder Mother  Arthritis Mother    Heart attack Father    Bipolar disorder Father    Schizophrenia Father    Hyperlipidemia Father    Alzheimer's disease Father    Cancer Maternal Grandmother    Migraines Maternal Grandmother    Hypertension Maternal Grandmother    Alzheimer's disease Paternal Grandmother    Leukemia Paternal Grandfather    Colon cancer Neg Hx    Colon polyps Neg Hx    Esophageal cancer Neg Hx    Rectal cancer Neg Hx    Stomach cancer Neg Hx    Sleep apnea Neg Hx     Past Surgical History:  Procedure Laterality Date   ABDOMINAL HYSTERECTOMY  2015   BREAST BIOPSY Left 09/10/2022   MM LT BREAST BX W LOC DEV 1ST LESION IMAGE BX SPEC STEREO  GUIDE 09/10/2022 [facility]   CESAREAN SECTION     CHOLECYSTECTOMY     COSMETIC SURGERY  2015 tummy tuck   EYE SURGERY  More than 15 years ago   keloid removal     PARTIAL HYSTERECTOMY     TONSILLECTOMY     TUBAL LIGATION  2012   tummy tuck     Social History   Occupational History   Not on file  Tobacco Use   Smoking status: Never   Smokeless tobacco: Never   Tobacco comments:    Never used  Vaping Use   Vaping status: Never Used  Substance and Sexual Activity   Alcohol use: Yes    Comment: Socially   Drug use: No   Sexual activity: Yes    Birth control/protection: Condom      Follow-Up Instructions: No follow-ups on file.   Orders:  No orders of the defined types were placed in this encounter.  No orders of the defined types were placed in this encounter.   Imaging: No results found.  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   Tension-type headache, not intractable 10/30/2019   Vitamin D  deficiency 04/03/2018   History of hysterectomy 07/15/2017   Fibroid 07/02/2013   Menorrhagia 07/02/2013   Past Medical History:  Diagnosis Date   Allergy In childhood   Anxiety    Arthritis 2023   Depression On and off for more than 10 years   GERD (gastroesophageal reflux disease) More than 10 years ago   Heart murmur N/A   HSV-2 (herpes simplex virus 2) infection    Hyperlipidemia    Otalgia of both ears 01/18/2023     Family History  Problem Relation Age of Onset   Diabetes Mother    Hypertension Mother    Hyperlipidemia Mother    Heart disease Mother    Varicose Veins Mother    Anxiety disorder Mother    Arthritis Mother    Heart attack Father    Bipolar disorder Father    Schizophrenia Father    Hyperlipidemia Father    Alzheimer's disease Father    Cancer Maternal Grandmother    Migraines Maternal Grandmother  Hypertension Maternal Grandmother    Alzheimer's disease Paternal Grandmother    Leukemia Paternal Grandfather    Colon cancer Neg Hx    Colon polyps Neg Hx    Esophageal cancer Neg Hx    Rectal cancer Neg Hx    Stomach cancer Neg Hx    Sleep apnea Neg Hx     Past Surgical History:  Procedure Laterality Date   ABDOMINAL HYSTERECTOMY  2015   BREAST BIOPSY Left 09/10/2022   MM LT BREAST BX W LOC DEV 1ST LESION IMAGE BX SPEC STEREO GUIDE 09/10/2022 [facility]   CESAREAN SECTION     CHOLECYSTECTOMY     COSMETIC SURGERY  2015 tummy tuck   EYE SURGERY  More than 15 years ago   keloid removal     PARTIAL HYSTERECTOMY     TONSILLECTOMY     TUBAL LIGATION  2012   tummy tuck     Social History   Occupational History   Not on file  Tobacco Use   Smoking status: Never   Smokeless tobacco: Never   Tobacco comments:    Never used  Vaping Use   Vaping status: Never Used  Substance and Sexual Activity   Alcohol use: Yes    Comment: Socially   Drug use: No   Sexual activity: Yes    Birth control/protection: Condom

## 2023-12-12 ENCOUNTER — Encounter: Payer: Self-pay | Admitting: Surgical

## 2023-12-12 ENCOUNTER — Ambulatory Visit: Admitting: Surgical

## 2023-12-12 DIAGNOSIS — Z9889 Other specified postprocedural states: Secondary | ICD-10-CM

## 2023-12-12 NOTE — Progress Notes (Unsigned)
 Post-Op Visit Note   Patient: Patricia French           Date of Birth: January 19, 1976           MRN: [id number] Visit Date: 12/12/2023 PCP: Georgina Speaks, FNP   Assessment & Plan:  Chief Complaint:  Chief Complaint  Patient presents with   Right Knee - Routine Post Op, Follow-up     11/07/2023 right knee arthroscopy, PLM, chondroplasty      Visit Diagnoses: No diagnosis found.  Plan: Patient is a 48 year old female who presents s/p right knee arthroscopy with meniscal debridement and chondroplasty on 11/07/2023.  Doing well.  Has a little bit of sensitivity near the lateral portal incision but otherwise she feels that her range of motion is good and she has really no discomfort.  She is not taking any medications for pain.  She has been doing stationary bike without any difficulties.  She wants to start playing pickle ball.  On exam, patient has well-healed portal incisions.  Small effusion is present.  No calf tenderness.  Negative Homans' sign.  Palpable PT pulse.  Intact ankle dorsiflexion, plantarflexion, quadricep strength, hamstring strength rated 5/5.  Able to perform straight leg raise without extensor lag.  Range of motion from 0 degrees extension to 105 degrees of knee flexion.  She is doing very well about 5 weeks out from her knee arthroscopy.  No difficulty with stairs.  Recommended that she continue with stationary bike and introduce some strengthening exercises with weight stack machines if these are available for her.  She can also start to do some pickleball; she wants to play doubles.  She will try this out and if there is any significantly increased soreness or pain afterward, reach back out to us .  Otherwise follow-up as needed.  Follow-Up Instructions: No follow-ups on file.   Orders:  No orders of the defined types were placed in this encounter.  No orders of the defined types were placed in this encounter.   Imaging: No results found.  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   Tension-type headache, not intractable 10/30/2019   Vitamin D  deficiency 04/03/2018   History of hysterectomy 07/15/2017   Fibroid 07/02/2013   Menorrhagia 07/02/2013   Past Medical History:  Diagnosis Date   Allergy In childhood   Anxiety    Arthritis 2023   Depression On and off for more than 10 years   GERD (gastroesophageal reflux disease) More than 10 years ago   Heart murmur N/A   HSV-2 (herpes simplex virus 2) infection    Hyperlipidemia    Otalgia of both ears 01/18/2023    Family History  Problem Relation Age of Onset   Diabetes Mother    Hypertension Mother    Hyperlipidemia Mother    Heart disease Mother    Varicose Veins Mother    Anxiety disorder Mother    Arthritis Mother    Heart attack Father    Bipolar disorder Father    Schizophrenia  Father    Hyperlipidemia Father    Alzheimer's disease Father    Cancer Maternal Grandmother    Migraines Maternal Grandmother    Hypertension Maternal Grandmother    Alzheimer's disease Paternal Grandmother    Leukemia Paternal Grandfather    Colon cancer Neg Hx    Colon polyps Neg Hx    Esophageal cancer Neg Hx    Rectal cancer Neg Hx    Stomach cancer Neg Hx    Sleep apnea Neg Hx     Past Surgical History:  Procedure Laterality Date   ABDOMINAL HYSTERECTOMY  2015   BREAST BIOPSY Left 09/10/2022   MM LT BREAST BX W LOC DEV 1ST LESION IMAGE BX SPEC STEREO GUIDE  09/10/2022 [facility]   CESAREAN SECTION     CHOLECYSTECTOMY     COSMETIC SURGERY  2015 tummy tuck   EYE SURGERY  More than 15 years ago   keloid removal     PARTIAL HYSTERECTOMY     TONSILLECTOMY     TUBAL LIGATION  2012   tummy tuck     Social History   Occupational History   Not on file  Tobacco Use   Smoking status: Never   Smokeless tobacco: Never   Tobacco comments:    Never used  Vaping Use   Vaping status: Never Used  Substance and Sexual Activity   Alcohol use: Yes    Comment: Socially   Drug use: No   Sexual activity: Yes    Birth control/protection: Condom

## 2023-12-14 ENCOUNTER — Encounter: Payer: Self-pay | Admitting: Nurse Practitioner

## 2023-12-14 ENCOUNTER — Ambulatory Visit: Admitting: Nurse Practitioner

## 2023-12-14 VITALS — BP 130/78 | HR 67 | Temp 98.6°F | Ht 63.0 in | Wt 194.0 lb

## 2023-12-14 DIAGNOSIS — Z23 Encounter for immunization: Secondary | ICD-10-CM

## 2023-12-14 DIAGNOSIS — Z6834 Body mass index (BMI) 34.0-34.9, adult: Secondary | ICD-10-CM

## 2023-12-14 DIAGNOSIS — E6609 Other obesity due to excess calories: Secondary | ICD-10-CM | POA: Diagnosis not present

## 2023-12-14 DIAGNOSIS — Z2821 Immunization not carried out because of patient refusal: Secondary | ICD-10-CM

## 2023-12-14 DIAGNOSIS — E66811 Obesity, class 1: Secondary | ICD-10-CM

## 2023-12-14 MED ORDER — ZEPBOUND 5 MG/0.5ML ~~LOC~~ SOAJ: 5.0000 mg | SUBCUTANEOUS | 1 refills | Status: DC

## 2023-12-14 NOTE — Progress Notes (Signed)
 Subjective:  Patient ID: Patricia French , female    DOB: April 11, 1975 , 48 y.o.   MRN: [id number]  Chief Complaint  Patient presents with   Obesity    Patient presents today for wanting to start weight loss medication, Patient reports compliance with medication. Patient denies any chest pain, SOB, or headaches. Patient reports she works out 2 days a week for about 30 mins, she reports she had knee surgery a month ago so has been limited. She reports she has tried and failed diets.      HPI  Discussed the use of AI scribe software for clinical note transcription with the patient, who gave verbal consent to proceed.  History of Present Illness Patricia French is a 48 year old female who presents with concerns about weight gain following knee surgery and menopause.  She attributes her weight gain to menopause, increased eating, and limited mobility due to recent knee surgery. She experiences menopausal symptoms such as hot flashes and night sweats and uses an estrogen patch prescribed by her gynecologist. She has been taking a vitamin supplement for 92 weeks.  Her mobility has been limited for most of the year due to knee surgery, but she engages in physical activity twice a week, including riding a stationary bike and walking to aid in knee rehabilitation. She plans to start weight training and take pickleball lessons with her fianc. She drinks at least 32 ounces of water daily, sometimes more.  She has a history of using weight management medications, including phentermine . She reports she did not have problems with it and still has a few pills left at home. She has a few pills left at home. She previously tried semaglutide  and tirzepatide, with the latter being more effective but costly, leading to discontinuation after three months. She gained back the weight lost and more after stopping the medication.  She is currently taking fluoxetine  for anxiety, which may contribute to  weight gain. She finds managing her diet and exercise challenging due to life circumstances, including caring for her 10 year old son and recent engagement.  No heart palpitations. She mentions a past diagnosis of heartburn but has not had recent symptoms.   Past Medical History:  Diagnosis Date   Allergy In childhood   Anxiety    Arthritis 2023   Depression On and off for more than 10 years   GERD (gastroesophageal reflux disease) More than 10 years ago   Heart murmur N/A   HSV-2 (herpes simplex virus 2) infection    Hyperlipidemia    Otalgia of both ears 01/18/2023     Family History  Problem Relation Age of Onset   Diabetes Mother    Hypertension Mother    Hyperlipidemia Mother    Heart disease Mother    Varicose Veins Mother    Anxiety disorder Mother    Arthritis Mother    Heart attack Father    Bipolar disorder Father    Schizophrenia Father    Hyperlipidemia Father    Alzheimer's disease Father    Cancer Maternal Grandmother    Migraines Maternal Grandmother    Hypertension Maternal Grandmother    Alzheimer's disease Paternal Grandmother    Leukemia Paternal Grandfather    Colon cancer Neg Hx    Colon polyps Neg Hx    Esophageal cancer Neg Hx    Rectal cancer Neg Hx    Stomach cancer Neg Hx    Sleep apnea Neg Hx      Current  Outpatient Medications:    ALPRAZolam  (XANAX ) 0.5 MG tablet, Take 1 tablet (0.5 mg total) by mouth 2 (two) times daily as needed for anxiety. alprazolam  0.5 mg tablet, Disp: 30 tablet, Rfl: 0   Ascorbic Acid (VITAMIN C) 1000 MG tablet, Take by mouth., Disp: , Rfl:    celecoxib  (CELEBREX ) 100 MG capsule, TAKE 1 CAPSULE(100 MG) BY MOUTH TWICE DAILY, Disp: 180 capsule, Rfl: 0   estradiol (VIVELLE-DOT) 0.075 MG/24HR, Place 1 patch onto the skin daily at 6 (six) AM., Disp: , Rfl:    fluticasone (FLONASE) 50 MCG/ACT nasal spray, , Disp: , Rfl:    Lysine 1000 MG TABS, Take 1,000 mg by mouth daily., Disp: , Rfl:    Magnesium  250 MG TABS, Take 1  tablet (250 mg total) by mouth daily. Take with evening meal, Disp: 90 tablet, Rfl: 1   progesterone (PROMETRIUM) 100 MG capsule, Take 100 mg by mouth daily., Disp: , Rfl:    rosuvastatin  (CRESTOR ) 5 MG tablet, TAKE 1 TABLET(5 MG) BY MOUTH DAILY, Disp: 90 tablet, Rfl: 1   tirzepatide (ZEPBOUND) 5 MG/0.5ML Pen, Inject 5 mg into the skin once a week., Disp: 2 mL, Rfl: 1   traMADol (ULTRAM) 50 MG tablet, Take 50 mg by mouth 4 (four) times daily., Disp: , Rfl:    valACYclovir (VALTREX) 500 MG tablet, Take 500 mg by mouth daily at 6 (six) AM., Disp: , Rfl:    Vitamin D , Ergocalciferol , (DRISDOL ) 1.25 MG (50000 UNIT) CAPS capsule, Take 1 capsule by mouth once a week, Disp: 12 capsule, Rfl: 1   Zinc 20 MG CAPS, Take 1 capsule by mouth daily., Disp: , Rfl:    FLUoxetine  (PROZAC ) 10 MG capsule, Take 1 capsule (10 mg total) by mouth daily., Disp: 90 capsule, Rfl: 1   Allergies  Allergen Reactions   Penicillins Hives   Porcine (Pork) Protein-Containing Drug Products      Review of Systems  Constitutional: Negative.   HENT: Negative.    Eyes: Negative.   Respiratory: Negative.    Cardiovascular: Negative.   Gastrointestinal: Negative.      Today's Vitals   12/14/23 1214  BP: 130/78  Pulse: 67  Temp: 98.6 F (37 C)  TempSrc: Oral  Weight: 194 lb (88 kg)  Height: 5' 3 (1.6 m)  PainSc: 0-No pain   Body mass index is 34.37 kg/m.  Wt Readings from Last 3 Encounters:  12/14/23 194 lb (88 kg)  09/14/23 188 lb (85.3 kg)  07/25/23 183 lb (83 kg)     Objective:  Physical Exam Vitals and nursing note reviewed.  Constitutional:      General: She is not in acute distress.    Appearance: Normal appearance. She is obese.  Cardiovascular:     Rate and Rhythm: Normal rate and regular rhythm.     Pulses: Normal pulses.     Heart sounds: Normal heart sounds. No murmur heard. Pulmonary:     Effort: Pulmonary effort is normal. No respiratory distress.     Breath sounds: Normal breath sounds.  No wheezing.  Skin:    General: Skin is warm and dry.     Capillary Refill: Capillary refill takes less than 2 seconds.  Neurological:     General: No focal deficit present.     Mental Status: She is alert and oriented to person, place, and time.     Cranial Nerves: No cranial nerve deficit.     Motor: No weakness.  Psychiatric:  Mood and Affect: Mood normal.        Behavior: Behavior normal.        Thought Content: Thought content normal.        Judgment: Judgment normal.      Assessment And Plan:  Need for influenza vaccination Assessment & Plan: Influenza vaccine administered Encouraged to take Tylenol  as needed for fever or muscle aches.   Orders: -     Flu vaccine trivalent PF, 6mos and older(Flulaval,Afluria,Fluarix,Fluzone)  Herpes zoster vaccination declined  Class 1 obesity due to excess calories without serious comorbidity with body mass index (BMI) of 34.0 to 34.9 in adult Assessment & Plan: Weight gain with BMI 34. Limited mobility post-knee surgery and menopause symptoms. Previous medications had limited success. Interested in Zepbound, considering Qsymia if not covered. - Submit prior authorization for Zepbound to insurance. - Consider Qsymia if Zepbound is not covered or too expensive. - Encouraged increased exercise and dietary changes. - Discussed with psychiatrist switching fluoxetine  to a weight-neutral medication.  Orders: -     Zepbound; Inject 5 mg into the skin once a week.  Dispense: 2 mL; Refill: 1    Return for keep same next .  Patient was given opportunity to ask questions. Patient verbalized understanding of the plan and was able to repeat key elements of the plan. All questions were answered to their satisfaction.    LILLETTE Gaines Ada, FNP, have reviewed all documentation for this visit. The documentation on 12/14/23 for the exam, diagnosis, procedures, and orders are all accurate and complete.   IF YOU HAVE BEEN REFERRED TO A  SPECIALIST, IT MAY TAKE 1-2 WEEKS TO SCHEDULE/PROCESS THE REFERRAL. IF YOU HAVE NOT HEARD FROM US /SPECIALIST IN TWO WEEKS, PLEASE GIVE US  A CALL AT [phone].

## 2023-12-20 ENCOUNTER — Telehealth (INDEPENDENT_AMBULATORY_CARE_PROVIDER_SITE_OTHER): Payer: Self-pay | Admitting: Adult Health

## 2023-12-20 ENCOUNTER — Encounter: Payer: Self-pay | Admitting: Adult Health

## 2023-12-20 DIAGNOSIS — F331 Major depressive disorder, recurrent, moderate: Secondary | ICD-10-CM

## 2023-12-20 DIAGNOSIS — F410 Panic disorder [episodic paroxysmal anxiety] without agoraphobia: Secondary | ICD-10-CM

## 2023-12-20 DIAGNOSIS — F4310 Post-traumatic stress disorder, unspecified: Secondary | ICD-10-CM | POA: Diagnosis not present

## 2023-12-20 DIAGNOSIS — F411 Generalized anxiety disorder: Secondary | ICD-10-CM

## 2023-12-20 MED ORDER — FLUOXETINE HCL 10 MG PO CAPS
10.0000 mg | ORAL_CAPSULE | Freq: Every day | ORAL | 1 refills | Status: AC
Start: 2023-12-20 — End: ?

## 2023-12-20 NOTE — Progress Notes (Signed)
 Patricia French [id number] Mar 13, 1975 48 y.o.  Virtual Visit via Video Note  I connected with pt @ on 12/20/23 at  5:30 PM EDT by a video enabled telemedicine application and verified that I am speaking with the correct person using two identifiers.   I discussed the limitations of evaluation and management by telemedicine and the availability of in person appointments. The patient expressed understanding and agreed to proceed.  I discussed the assessment and treatment plan with the patient. The patient was provided an opportunity to ask questions and all were answered. The patient agreed with the plan and demonstrated an understanding of the instructions.   The patient was advised to call back or seek an in-person evaluation if the symptoms worsen or if the condition fails to improve as anticipated.  I provided 25 minutes of non-face-to-face time during this encounter.  The patient was located at home.  The provider was located at [facility].   Angeline LOISE Sayers, NP   Subjective:   Patient ID:  Patricia French is a 48 y.o. (DOB January 29, 1976) female.  Chief Complaint: No chief complaint on file.   HPI Mickelle Goupil presents for follow-up of MDD, GAD, PTSD, panic attacks.   Referred by PCP Gaines Ada - PCP.  Describes mood today as ok. Pleasant. Denies tearfulness. Mood symptoms - denies anxiety, depression and irritability. Reports stable interest and motivation. Reports decreased panic attacks - I still have them. Reports some worry, rumination and over thinking. Reports mood is stable. Stating I feel like I'm doing alright. Taking medications as prescribed.  Energy levels lower - not the greatest. Active, does not have a regular exercise routine.  Enjoys some usual interests and activities. Engaged. Has 3 children - 3 grandchildren. Spending time with family. Appetite adequate. Weight gain - 10 pounds - 190 pounds. Sleeping better at night - TMJ wears mouth guard.   Averages 7 hours.  Reports focus and concentration difficulties. Completing tasks. Managing aspects of household. Works full time for [employer].  Denies SI or HI. Reports a suicide attempt in high school - over dosed on tylenol . Denies AH or VH. Denies self harm. Denies substance use   Previous medication trials:  Xanax , Lorazepam , Wellbutrin , Trintellix   Review of Systems:  Review of Systems  Musculoskeletal:  Negative for gait problem.  Neurological:  Negative for tremors.  Psychiatric/Behavioral:         Please refer to HPI    Medications: I have reviewed the patient's current medications.  Current Outpatient Medications  Medication Sig Dispense Refill   ALPRAZolam  (XANAX ) 0.5 MG tablet Take 1 tablet (0.5 mg total) by mouth 2 (two) times daily as needed for anxiety. alprazolam  0.5 mg tablet 30 tablet 0   Ascorbic Acid (VITAMIN C) 1000 MG tablet Take by mouth.     celecoxib  (CELEBREX ) 100 MG capsule TAKE 1 CAPSULE(100 MG) BY MOUTH TWICE DAILY 180 capsule 0   estradiol (VIVELLE-DOT) 0.075 MG/24HR Place 1 patch onto the skin daily at 6 (six) AM.     FLUoxetine  (PROZAC ) 10 MG capsule Take 1 capsule (10 mg total) by mouth daily. 90 capsule 1   fluticasone (FLONASE) 50 MCG/ACT nasal spray      Lysine 1000 MG TABS Take 1,000 mg by mouth daily.     Magnesium  250 MG TABS Take 1 tablet (250 mg total) by mouth daily. Take with evening meal 90 tablet 1   progesterone (PROMETRIUM) 100 MG capsule Take 100 mg by mouth daily.  rosuvastatin  (CRESTOR ) 5 MG tablet TAKE 1 TABLET(5 MG) BY MOUTH DAILY 90 tablet 1   tirzepatide (ZEPBOUND) 5 MG/0.5ML Pen Inject 5 mg into the skin once a week. 2 mL 1   traMADol (ULTRAM) 50 MG tablet Take 50 mg by mouth 4 (four) times daily.     valACYclovir (VALTREX) 500 MG tablet Take 500 mg by mouth daily at 6 (six) AM.     Vitamin D , Ergocalciferol , (DRISDOL ) 1.25 MG (50000 UNIT) CAPS capsule Take 1 capsule by mouth once a week 12 capsule 1   Zinc  20 MG CAPS Take 1 capsule by mouth daily.     No current facility-administered medications for this visit.    Medication Side Effects: None  Allergies:  Allergies  Allergen Reactions   Penicillins Hives   Porcine (Pork) Protein-Containing Drug Products     Past Medical History:  Diagnosis Date   Allergy In childhood   Anxiety    Arthritis 2023   Depression On and off for more than 10 years   GERD (gastroesophageal reflux disease) More than 10 years ago   Heart murmur N/A   HSV-2 (herpes simplex virus 2) infection    Hyperlipidemia    Otalgia of both ears 01/18/2023    Family History  Problem Relation Age of Onset   Diabetes Mother    Hypertension Mother    Hyperlipidemia Mother    Heart disease Mother    Varicose Veins Mother    Anxiety disorder Mother    Arthritis Mother    Heart attack Father    Bipolar disorder Father    Schizophrenia Father    Hyperlipidemia Father    Alzheimer's disease Father    Cancer Maternal Grandmother    Migraines Maternal Grandmother    Hypertension Maternal Grandmother    Alzheimer's disease Paternal Grandmother    Leukemia Paternal Grandfather    Colon cancer Neg Hx    Colon polyps Neg Hx    Esophageal cancer Neg Hx    Rectal cancer Neg Hx    Stomach cancer Neg Hx    Sleep apnea Neg Hx     Social History   Socioeconomic History   Marital status: Divorced    Spouse name: Not on file   Number of children: Not on file   Years of education: Not on file   Highest education level: Not on file  Occupational History   Not on file  Tobacco Use   Smoking status: Never   Smokeless tobacco: Never   Tobacco comments:    Never used  Vaping Use   Vaping status: Never Used  Substance and Sexual Activity   Alcohol use: Yes    Comment: Socially   Drug use: No   Sexual activity: Yes    Birth control/protection: Condom  Other Topics Concern   Not on file  Social History Narrative   Pt works    Pt lives with family    Social  Drivers of Corporate Investment Banker Strain: Not on file  Food Insecurity: Not on file  Transportation Needs: Not on file  Physical Activity: Not on file  Stress: Not on file  Social Connections: Not on file  Intimate Partner Violence: Not on file    Past Medical History, Surgical history, Social history, and Family history were reviewed and updated as appropriate.   Please see review of systems for further details on the patient's review from today.   Objective:   Physical Exam:  There were  no vitals taken for this visit.  Physical Exam Constitutional:      General: She is not in acute distress. Musculoskeletal:        General: No deformity.  Neurological:     Mental Status: She is alert and oriented to person, place, and time.     Coordination: Coordination normal.  Psychiatric:        Attention and Perception: Attention and perception normal. She does not perceive auditory or visual hallucinations.        Mood and Affect: Mood normal. Mood is not anxious or depressed. Affect is not labile, blunt, angry or inappropriate.        Speech: Speech normal.        Behavior: Behavior normal.        Thought Content: Thought content normal. Thought content is not paranoid or delusional. Thought content does not include homicidal or suicidal ideation. Thought content does not include homicidal or suicidal plan.        Cognition and Memory: Cognition and memory normal.        Judgment: Judgment normal.     Comments: Insight intact     Lab Review:     Component Value Date/Time   NA 142 07/25/2023 1008   K 4.7 07/25/2023 1008   CL 102 07/25/2023 1008   CO2 24 07/25/2023 1008   GLUCOSE 80 07/25/2023 1008   GLUCOSE 90 02/02/2023 1709   BUN 13 07/25/2023 1008   CREATININE 0.61 07/25/2023 1008   CALCIUM  9.6 07/25/2023 1008   PROT 6.8 07/25/2023 1008   ALBUMIN 4.8 07/25/2023 1008   AST 24 07/25/2023 1008   ALT 22 07/25/2023 1008   ALKPHOS 86 07/25/2023 1008   BILITOT 0.5  07/25/2023 1008   GFRNONAA >60 02/02/2023 1709       Component Value Date/Time   WBC 6.2 07/25/2023 1008   WBC 11.9 (H) 02/02/2023 1709   RBC 4.79 07/25/2023 1008   RBC 5.13 (H) 02/02/2023 1709   HGB 14.5 07/25/2023 1008   HCT 43.9 07/25/2023 1008   PLT 308 07/25/2023 1008   MCV 92 07/25/2023 1008   MCH 30.3 07/25/2023 1008   MCH 29.6 02/02/2023 1709   MCHC 33.0 07/25/2023 1008   MCHC 33.5 02/02/2023 1709   RDW 12.1 07/25/2023 1008   LYMPHSABS 2.3 07/25/2023 1008   MONOABS 0.7 10/22/2020 1631   EOSABS 0.1 07/25/2023 1008   BASOSABS 0.0 07/25/2023 1008    No results found for: POCLITH, LITHIUM   No results found for: PHENYTOIN, PHENOBARB, VALPROATE, CBMZ   .res Assessment: Plan:    Plan:  PDMP reviewed  Prozac  10mg  daily for mood symptoms.  RTC 6 months  25 minutes spent dedicated to the care of this patient on the date of this encounter to include pre-visit review of records, ordering of medication, post visit documentation, and face-to-face time with the patient discussing depression, anxiety, panic attacks and PTSD. Discussed continuing current medication regimen.  Patient advised to contact office with any questions, adverse effects, or acute worsening in signs and symptoms.  There are no diagnoses linked to this encounter.   Please see After Visit Summary for patient specific instructions.  Future Appointments  Date Time Provider Department Center  12/20/2023  5:30 PM Esthela Brandner, Angeline Mattocks, NP [facility] None  01/24/2024  8:20 AM Georgina Speaks, FNP [facility] [phone] [place]  02/10/2024 12:00 PM Shanielle Correll Nattalie, NP [facility] None  07/25/2024  8:20 AM Georgina Speaks, FNP [facility] [phone] [place]  No orders of the defined types were placed in this encounter.     -------------------------------

## 2023-12-21 DIAGNOSIS — Z23 Encounter for immunization: Secondary | ICD-10-CM | POA: Insufficient documentation

## 2023-12-21 NOTE — Assessment & Plan Note (Signed)
 Influenza vaccine administered Encouraged to take Tylenol as needed for fever or muscle aches.

## 2023-12-21 NOTE — Assessment & Plan Note (Signed)
 Weight gain with BMI 34. Limited mobility post-knee surgery and menopause symptoms. Previous medications had limited success. Interested in Zepbound, considering Qsymia if not covered. - Submit prior authorization for Zepbound to insurance. - Consider Qsymia if Zepbound is not covered or too expensive. - Encouraged increased exercise and dietary changes. - Discussed with psychiatrist switching fluoxetine  to a weight-neutral medication.

## 2023-12-23 ENCOUNTER — Telehealth: Payer: Self-pay

## 2023-12-23 NOTE — Telephone Encounter (Signed)
 PA for Zepbound sent to plan.

## 2023-12-26 ENCOUNTER — Other Ambulatory Visit: Payer: Self-pay | Admitting: Nurse Practitioner

## 2023-12-26 ENCOUNTER — Encounter: Payer: Self-pay | Admitting: Radiology

## 2023-12-26 ENCOUNTER — Telehealth: Payer: Self-pay | Admitting: Nurse Practitioner

## 2023-12-26 ENCOUNTER — Encounter: Payer: Self-pay | Admitting: Nurse Practitioner

## 2023-12-26 DIAGNOSIS — E6609 Other obesity due to excess calories: Secondary | ICD-10-CM

## 2023-12-26 NOTE — Telephone Encounter (Signed)
 Pt called expressing concerns about PA for weight loss medication and provider Moore bedside manner pt wanted to speak with Radiographer, Therapeutic. Print production planner spoke with pt and let her know she would speak with DR and CMA about concerns.

## 2023-12-27 ENCOUNTER — Ambulatory Visit: Admitting: Nurse Practitioner

## 2023-12-27 MED ORDER — PHENTERMINE-TOPIRAMATE ER 3.75-23 MG PO CP24: 1.0000 | ORAL_CAPSULE | Freq: Every day | ORAL | 0 refills | Status: DC

## 2023-12-27 MED ORDER — PHENTERMINE-TOPIRAMATE ER 7.5-46 MG PO CP24: 1.0000 | ORAL_CAPSULE | Freq: Every day | ORAL | 1 refills | Status: DC

## 2024-01-02 IMAGING — MR MR SHOULDER*R* W/O CM
5 series · 35 of 40 positions shown · non-contrast
Comparison: X-ray 04/02/2021

CLINICAL DATA: Shoulder pain, rotator cuff disorder suspected, xray
done right shoulder pain, R/O RCT or labral tear

EXAM:
MRI OF THE RIGHT SHOULDER WITHOUT CONTRAST
TECHNIQUE: Multiplanar, multisequence MR imaging of the shoulder was performed.
No intravenous contrast was administered.

[Series 7: T2 fat-sat · axial · 4.0mm · 0.55mm/px · z∈[-23,+68]mm · 8 of 21 slices shown (1 of 3)]
[im 1/21]
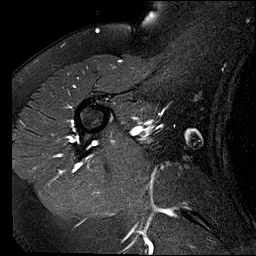
[im 3/21]
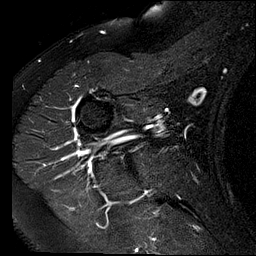
[im 7/21]
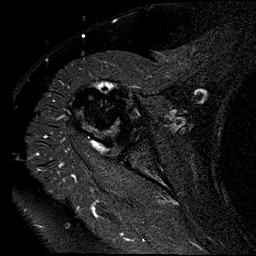
[im 9/21]
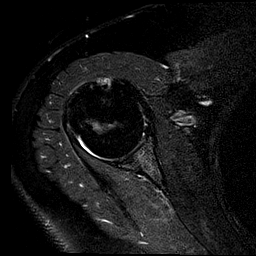
[im 12/21]
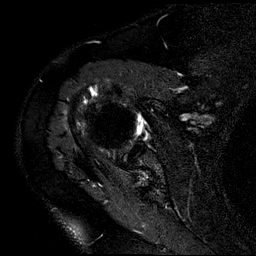
[im 14/21]
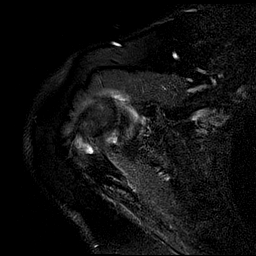
[im 18/21]
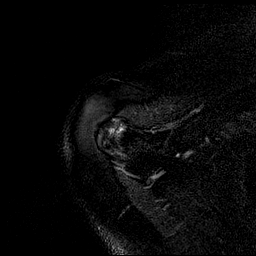
[im 21/21]
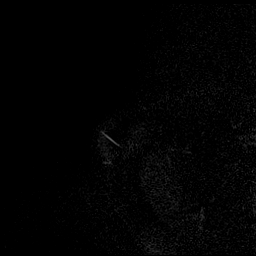

[Series 8: T2 fat-sat · sagittal · 4.0mm · 0.59mm/px · 7 of 16 slices shown (2 of 3)]
[im 1/16]
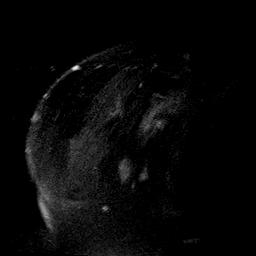
[im 3/16]
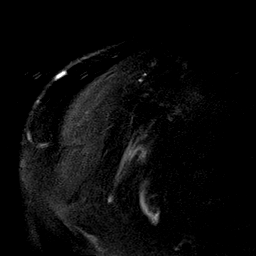
[im 6/16]
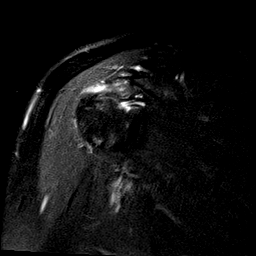
[im 8/16]
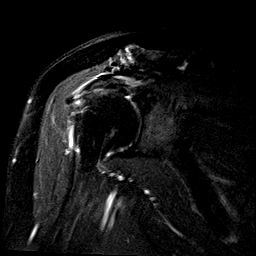
[im 11/16]
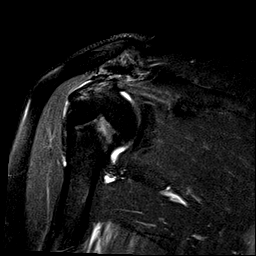
[im 13/16]
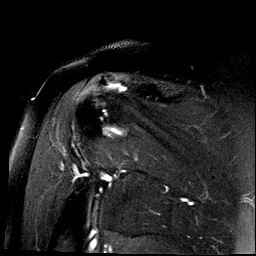
[im 16/16]
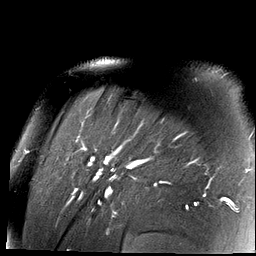

[Series 9: PD · sagittal · 4.0mm · 0.29mm/px · 7 of 16 slices shown]
[im 1/16]
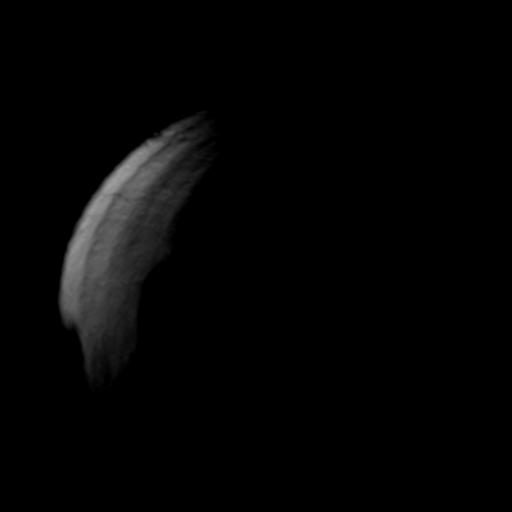
[im 3/16]
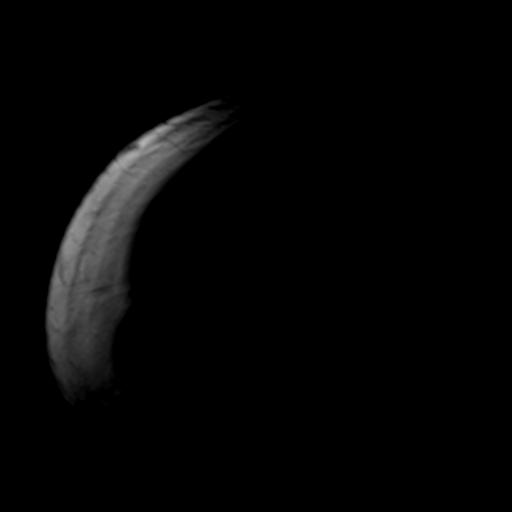
[im 6/16]
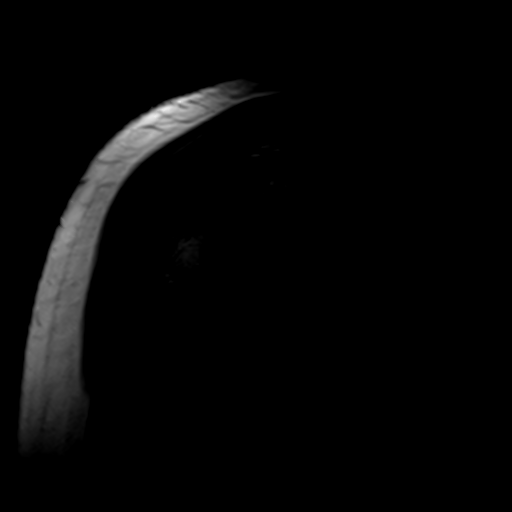
[im 8/16]
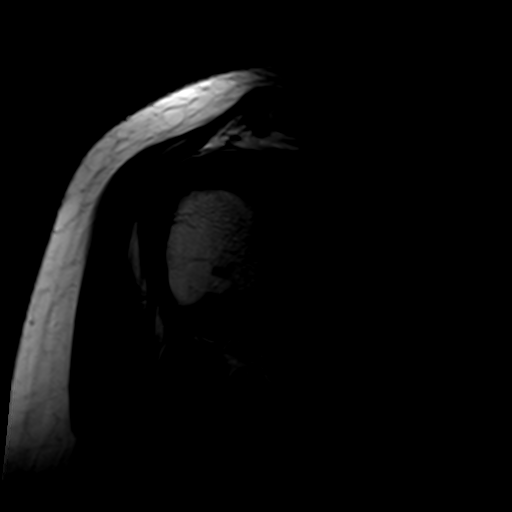
[im 11/16]
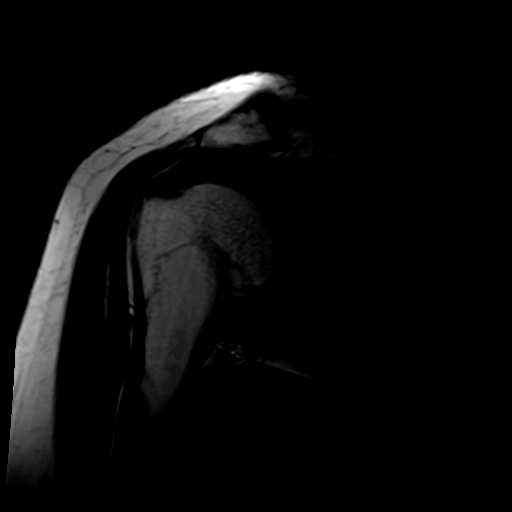
[im 13/16]
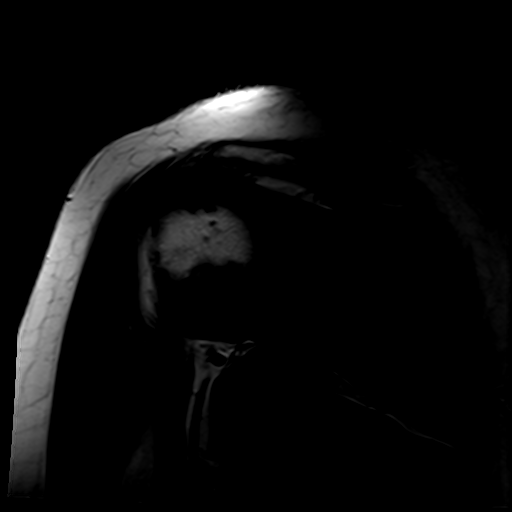
[im 16/16]
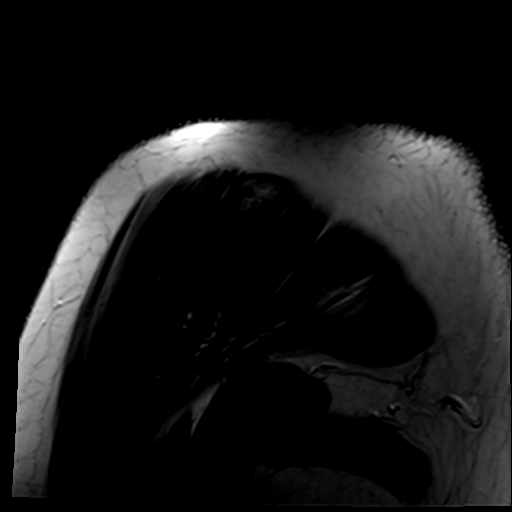

[Series 10: T2 fat-sat · oblique · 4.0mm · 0.59mm/px · 8 of 17 slices shown (3 of 3)]
[im 1/17]
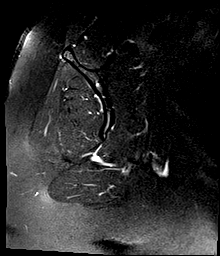
[im 3/17]
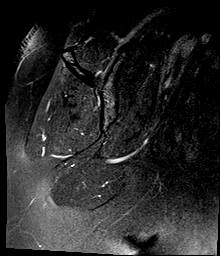
[im 5/17]
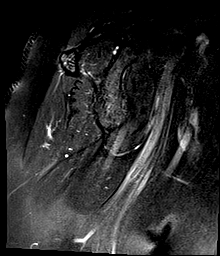
[im 7/17]
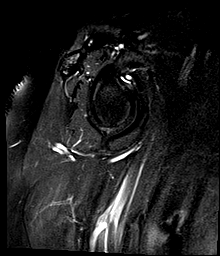
[im 10/17]
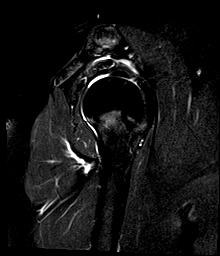
[im 12/17]
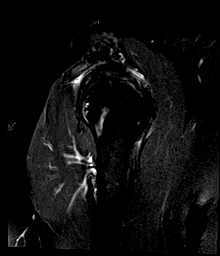
[im 14/17]
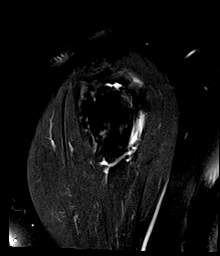
[im 17/17]
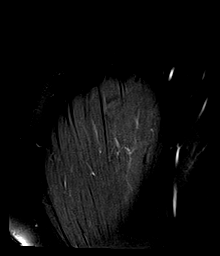

[Series 11: T1 · oblique · 4.0mm · 0.29mm/px · 5 of 17 slices shown]
[im 1/17]
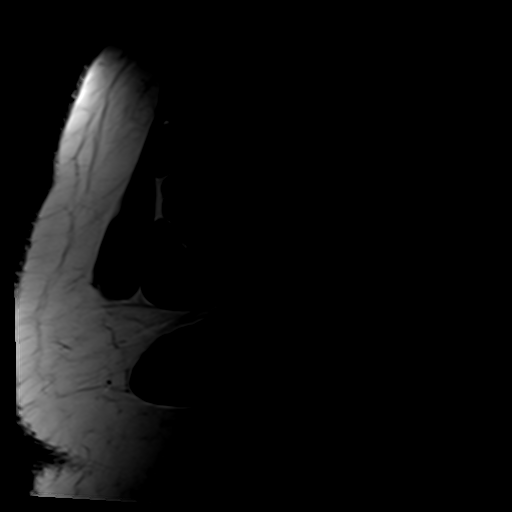
[im 3/17]
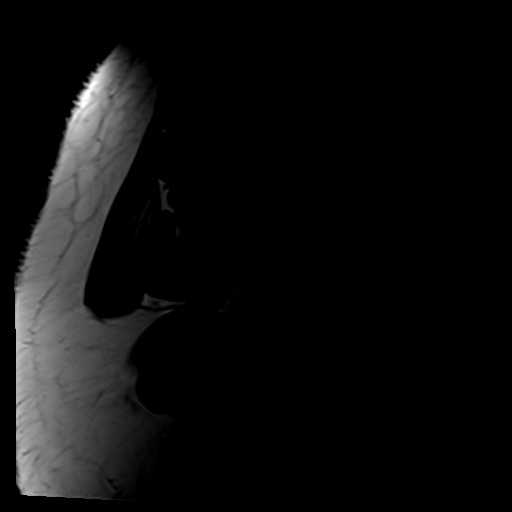
[im 5/17]
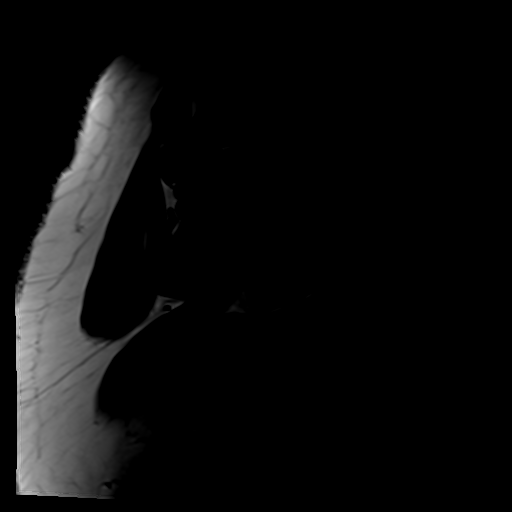
[im 7/17]
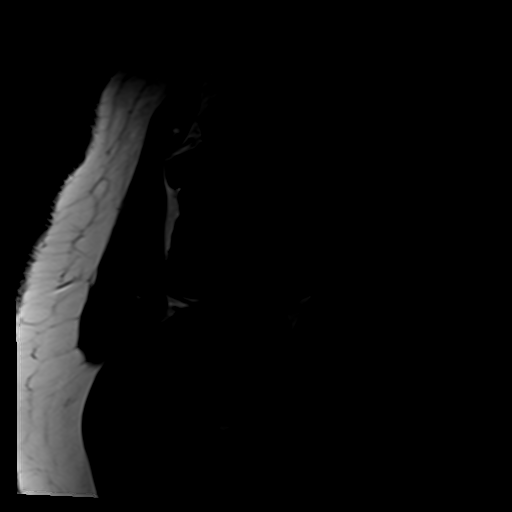
[im 10/17]
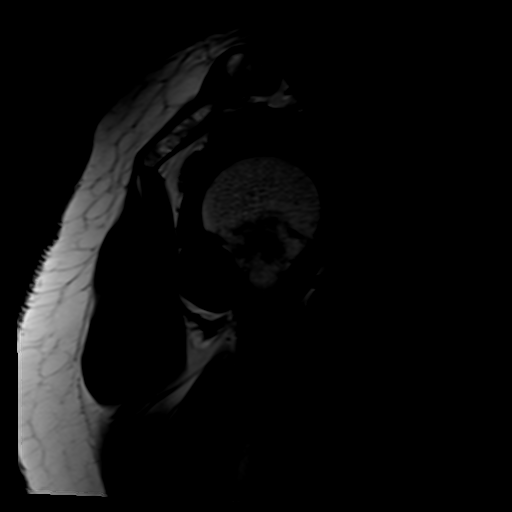

[35 of 40 positions shown; findings below may reference images not displayed]

FINDINGS: Rotator cuff: Focal full-thickness tear of the mid supraspinatus
tendon insertion with minimal retraction. Tear measures
approximately 8 mm in AP dimension. Background of mild tendinosis.
Infraspinatus, subscapularis, and teres minor tendons appear intact.

Muscles: Preserved bulk and signal intensity of the rotator cuff
musculature without edema, atrophy, or fatty infiltration.

Biceps long head:  Intra-articular biceps tendinosis.

Acromioclavicular Joint: Mild-moderate degenerative changes of the
AC joint. Small volume subacromial-subdeltoid bursal fluid
communicating with the glenohumeral joint.

Glenohumeral Joint: No joint effusion. No focal cartilage defect.

Labrum: Grossly intact although evaluation is limited in the absence
of intra-articular contrast. No paralabral cyst.

Bones: No acute fracture. No dislocation. No bone marrow edema. No
marrow replacing bone lesion.

Other: None.
IMPRESSION: 1. Small focal full-thickness tear of the distal supraspinatus
tendon.
2. Intra-articular biceps tendinosis.
3. Mild-moderate degenerative changes of the AC joint.

## 2024-01-24 ENCOUNTER — Ambulatory Visit: Payer: Self-pay | Admitting: Nurse Practitioner

## 2024-01-24 VITALS — BP 130/88 | HR 78 | Temp 98.8°F | Ht 63.0 in | Wt 194.8 lb

## 2024-01-24 DIAGNOSIS — E6609 Other obesity due to excess calories: Secondary | ICD-10-CM | POA: Diagnosis not present

## 2024-01-24 DIAGNOSIS — Z2821 Immunization not carried out because of patient refusal: Secondary | ICD-10-CM | POA: Diagnosis not present

## 2024-01-24 DIAGNOSIS — Z139 Encounter for screening, unspecified: Secondary | ICD-10-CM

## 2024-01-24 DIAGNOSIS — E782 Mixed hyperlipidemia: Secondary | ICD-10-CM

## 2024-01-24 DIAGNOSIS — E559 Vitamin D deficiency, unspecified: Secondary | ICD-10-CM | POA: Diagnosis not present

## 2024-01-24 DIAGNOSIS — Z6834 Body mass index (BMI) 34.0-34.9, adult: Secondary | ICD-10-CM | POA: Diagnosis not present

## 2024-01-24 DIAGNOSIS — Z79899 Other long term (current) drug therapy: Secondary | ICD-10-CM

## 2024-01-24 DIAGNOSIS — E66811 Obesity, class 1: Secondary | ICD-10-CM | POA: Diagnosis not present

## 2024-01-24 DIAGNOSIS — L819 Disorder of pigmentation, unspecified: Secondary | ICD-10-CM | POA: Diagnosis not present

## 2024-01-24 NOTE — Progress Notes (Signed)
 LILLETTE Patricia French, CMA,acting as a neurosurgeon for Patricia Ada, FNP.,have documented all relevant documentation on the behalf of Patricia Ada, FNP,as directed by  Patricia Ada, FNP while in the presence of Patricia Ada, FNP.  Subjective:  Patient ID: Patricia French , female    DOB: Jan 18, 1976 , 48 y.o.   MRN: [id number]  Chief Complaint  Patient presents with   Hyperlipidemia    Patient presents today for a chol follow up, Patient reports compliance with medication. Patient denies any chest pain, SOB, or headaches. Patient has no concerns today.    Nevus    Patient reports having a mole on her left thumb under nail that she first noticed about a month ago. Patient reports she thinks it is changing. She does see dermatology today as well.      She has been on Qsymia  for 2 weeks. She has been consistent with 2 days a week with riding her stationary bike (15 minutes early in day and 15 minutes in evening). She sees Dr. Jolene Jewel at [facility] and has an appt today. She is going back for a f/u due to hair thinning.   \ '  Discussed the use of AI scribe software for clinical note transcription with the patient, who gave verbal consent to proceed.  History of Present Illness Patricia French is a 48 year old female who presents for a cholesterol follow-up and weight management.  She has been taking Qsymia  for approximately two weeks, which has altered the taste of sodas, making them taste 'flat'. Consequently, she has not consumed soda for about a week and a half. Her weight has remained stable on the lower dose, which she finds satisfactory. No side effects from Qsymia  other than the change in soda taste, which she considers a positive effect.  She engages in physical activity by riding a stationary bike for about 30 minutes on the days she works from home, typically achieving two days of exercise per week.  She is also taking rosuvastatin , with recent refills indicating a  90-day supply, although she has been receiving 30-day refills due to insurance constraints.  She mentions a new mole under her fingernail that appeared about a month ago. It is not painful, but she plans to discuss it with her dermatologist during a follow-up appointment for hair thinning.  Past Medical History:  Diagnosis Date   Allergy In childhood   Anxiety    Arthritis 2023   Depression On and off for more than 10 years   GERD (gastroesophageal reflux disease) More than 10 years ago   Heart murmur N/A   HSV-2 (herpes simplex virus 2) infection    Hyperlipidemia    Otalgia of both ears 01/18/2023     Family History  Problem Relation Age of Onset   Diabetes Mother    Hypertension Mother    Hyperlipidemia Mother    Heart disease Mother    Varicose Veins Mother    Anxiety disorder Mother    Arthritis Mother    Heart attack Father    Bipolar disorder Father    Schizophrenia Father    Hyperlipidemia Father    Alzheimer's disease Father    Cancer Maternal Grandmother    Migraines Maternal Grandmother    Hypertension Maternal Grandmother    Alzheimer's disease Paternal Grandmother    Leukemia Paternal Grandfather    Colon cancer Neg Hx    Colon polyps Neg Hx    Esophageal cancer Neg Hx    Rectal  cancer Neg Hx    Stomach cancer Neg Hx    Sleep apnea Neg Hx      Current Outpatient Medications:    ALPRAZolam  (XANAX ) 0.5 MG tablet, Take 1 tablet (0.5 mg total) by mouth 2 (two) times daily as needed for anxiety. alprazolam  0.5 mg tablet, Disp: 30 tablet, Rfl: 0   Ascorbic Acid (VITAMIN C) 1000 MG tablet, Take by mouth., Disp: , Rfl:    celecoxib  (CELEBREX ) 100 MG capsule, TAKE 1 CAPSULE(100 MG) BY MOUTH TWICE DAILY, Disp: 180 capsule, Rfl: 0   estradiol (VIVELLE-DOT) 0.075 MG/24HR, Place 1 patch onto the skin daily at 6 (six) AM., Disp: , Rfl:    FLUoxetine  (PROZAC ) 10 MG capsule, Take 1 capsule (10 mg total) by mouth daily., Disp: 90 capsule, Rfl: 1   fluticasone (FLONASE)  50 MCG/ACT nasal spray, , Disp: , Rfl:    Lysine 1000 MG TABS, Take 1,000 mg by mouth daily., Disp: , Rfl:    Magnesium  250 MG TABS, Take 1 tablet (250 mg total) by mouth daily. Take with evening meal, Disp: 90 tablet, Rfl: 1   Phentermine -Topiramate  ER (QSYMIA ) 7.5-46 MG CP24, Take 1 capsule by mouth daily., Disp: 30 capsule, Rfl: 1   progesterone (PROMETRIUM) 100 MG capsule, Take 100 mg by mouth daily., Disp: , Rfl:    rosuvastatin  (CRESTOR ) 5 MG tablet, TAKE 1 TABLET(5 MG) BY MOUTH DAILY, Disp: 90 tablet, Rfl: 1   traMADol (ULTRAM) 50 MG tablet, Take 50 mg by mouth 4 (four) times daily., Disp: , Rfl:    valACYclovir (VALTREX) 500 MG tablet, Take 500 mg by mouth daily at 6 (six) AM., Disp: , Rfl:    Vitamin D , Ergocalciferol , (DRISDOL ) 1.25 MG (50000 UNIT) CAPS capsule, Take 1 capsule by mouth once a week, Disp: 12 capsule, Rfl: 1   Zinc 20 MG CAPS, Take 1 capsule by mouth daily., Disp: , Rfl:    Allergies  Allergen Reactions   Penicillins Hives   Porcine (Pork) Protein-Containing Drug Products      Review of Systems  Constitutional: Negative.   HENT: Negative.    Eyes: Negative.   Respiratory: Negative.    Cardiovascular: Negative.   Gastrointestinal: Negative.   Skin:        Mole under her left thumb  Neurological: Negative.   Psychiatric/Behavioral: Negative.       Today's Vitals   01/24/24 0830  BP: 130/88  Pulse: 78  Temp: 98.8 F (37.1 C)  TempSrc: Oral  Weight: 194 lb 12.8 oz (88.4 kg)  Height: 5' 3 (1.6 m)  PainSc: 0-No pain   Body mass index is 34.51 kg/m.  Wt Readings from Last 3 Encounters:  01/24/24 194 lb 12.8 oz (88.4 kg)  12/14/23 194 lb (88 kg)  09/14/23 188 lb (85.3 kg)     Objective:  Physical Exam Vitals and nursing note reviewed.  Constitutional:      General: She is not in acute distress.    Appearance: Normal appearance. She is obese.  Cardiovascular:     Rate and Rhythm: Normal rate and regular rhythm.     Pulses: Normal pulses.      Heart sounds: Normal heart sounds. No murmur heard. Pulmonary:     Effort: Pulmonary effort is normal. No respiratory distress.     Breath sounds: Normal breath sounds. No wheezing.  Skin:    General: Skin is warm and dry.     Capillary Refill: Capillary refill takes less than 2 seconds.  Comments: Has a 1 mm darkened area to left thumb nail, flat.   Neurological:     General: No focal deficit present.     Mental Status: She is alert and oriented to person, place, and time.     Cranial Nerves: No cranial nerve deficit.     Motor: No weakness.  Psychiatric:        Mood and Affect: Mood normal.        Behavior: Behavior normal.        Thought Content: Thought content normal.        Judgment: Judgment normal.         Assessment And Plan:   Assessment & Plan Mixed hyperlipidemia Managed with rosuvastatin . Refill available, potential pharmacy issue with 90-day supply due to insurance. - Ensure rosuvastatin  90-day supply if possible by checking with pharmacy or insurance. - Continue current rosuvastatin  regimen. Pneumococcal vaccination declined  Class 1 obesity due to excess calories without serious comorbidity with body mass index (BMI) of 34.0 to 34.9 in adult Qsymia  initiated, no significant weight change at 3.75 mg dose. Reduced soda intake due to altered taste. Consistent exercise noted. No other side effects. Discussed future injectable options, but continuing Qsymia  due to tolerance and appetite control. - Increase Qsymia  to 7.5 mg after 3.75 mg dose. - Encourage exercise 30 minutes daily, five days a week. - Monitor weight and appetite control with Qsymia . - Discuss future weight management options if needed. Encounter for screening  Vitamin D  deficiency Will check vitamin D  level and supplement as needed.    Also encouraged to spend 15 minutes in the sun daily.  Other long term (current) drug therapy  Hyperpigmentation She has a hyperpigmentation underneath her  left thumnail approximately 1 mm, flat. She will discuss with her dermatologist later today.   Orders Placed This Encounter  Procedures   Lipid panel   Hepatitis B Surface Antibody   Vitamin D  (25 hydroxy)   Liver Profile    Return for 2 months weight check.  Patient was given opportunity to ask questions. Patient verbalized understanding of the plan and was able to repeat key elements of the plan. All questions were answered to their satisfaction.   LILLETTE Patricia Ada, FNP, have reviewed all documentation for this visit. The documentation on 01/24/24 for the exam, diagnosis, procedures, and orders are all accurate and complete.    IF YOU HAVE BEEN REFERRED TO A SPECIALIST, IT MAY TAKE 1-2 WEEKS TO SCHEDULE/PROCESS THE REFERRAL. IF YOU HAVE NOT HEARD FROM US /SPECIALIST IN TWO WEEKS, PLEASE GIVE US  A CALL AT [phone].

## 2024-01-25 LAB — HEPATIC FUNCTION PANEL
ALT: 25 IU/L (ref 0–32)
AST: 25 IU/L (ref 0–40)
Albumin: 4.6 g/dL (ref 3.9–4.9)
Alkaline Phosphatase: 93 IU/L (ref 41–116)
Bilirubin Total: 0.7 mg/dL (ref 0.0–1.2)
Bilirubin, Direct: 0.22 mg/dL (ref 0.00–0.40)
Total Protein: 6.6 g/dL (ref 6.0–8.5)

## 2024-01-25 LAB — LIPID PANEL
Chol/HDL Ratio: 3.8 ratio (ref 0.0–4.4)
Cholesterol, Total: 171 mg/dL (ref 100–199)
HDL: 45 mg/dL (ref >39)
LDL Chol Calc (NIH): 110 mg/dL — ABNORMAL HIGH (ref 0–99)
Triglycerides: 84 mg/dL (ref 0–149)
VLDL Cholesterol Cal: 16 mg/dL (ref 5–40)

## 2024-01-25 LAB — HEPATITIS B SURFACE ANTIBODY,QUALITATIVE: Hep B Surface Ab, Qual: NONREACTIVE

## 2024-01-25 LAB — VITAMIN D 25 HYDROXY (VIT D DEFICIENCY, FRACTURES): Vit D, 25-Hydroxy: 28.9 ng/mL — ABNORMAL LOW (ref 30.0–100.0)

## 2024-02-01 ENCOUNTER — Ambulatory Visit: Payer: Self-pay | Admitting: Nurse Practitioner

## 2024-02-01 DIAGNOSIS — L819 Disorder of pigmentation, unspecified: Secondary | ICD-10-CM | POA: Insufficient documentation

## 2024-02-01 DIAGNOSIS — E559 Vitamin D deficiency, unspecified: Secondary | ICD-10-CM

## 2024-02-01 MED ORDER — VITAMIN D (ERGOCALCIFEROL) 1.25 MG (50000 UNIT) PO CAPS: ORAL_CAPSULE | ORAL | 1 refills | Status: AC

## 2024-02-01 NOTE — Assessment & Plan Note (Signed)
 Qsymia  initiated, no significant weight change at 3.75 mg dose. Reduced soda intake due to altered taste. Consistent exercise noted. No other side effects. Discussed future injectable options, but continuing Qsymia  due to tolerance and appetite control. - Increase Qsymia  to 7.5 mg after 3.75 mg dose. - Encourage exercise 30 minutes daily, five days a week. - Monitor weight and appetite control with Qsymia . - Discuss future weight management options if needed.

## 2024-02-01 NOTE — Assessment & Plan Note (Signed)
 Managed with rosuvastatin . Refill available, potential pharmacy issue with 90-day supply due to insurance. - Ensure rosuvastatin  90-day supply if possible by checking with pharmacy or insurance. - Continue current rosuvastatin  regimen.

## 2024-02-01 NOTE — Assessment & Plan Note (Signed)
 Will check vitamin D  level and supplement as needed.    Also encouraged to spend 15 minutes in the sun daily.

## 2024-02-01 NOTE — Assessment & Plan Note (Signed)
 She has a hyperpigmentation underneath her left thumnail approximately 1 mm, flat. She will discuss with her dermatologist later today.

## 2024-02-10 ENCOUNTER — Encounter: Payer: Self-pay | Admitting: Adult Health

## 2024-02-10 ENCOUNTER — Ambulatory Visit: Admitting: Adult Health

## 2024-02-10 DIAGNOSIS — F411 Generalized anxiety disorder: Secondary | ICD-10-CM | POA: Diagnosis not present

## 2024-02-10 DIAGNOSIS — F419 Anxiety disorder, unspecified: Secondary | ICD-10-CM | POA: Diagnosis not present

## 2024-02-10 DIAGNOSIS — F410 Panic disorder [episodic paroxysmal anxiety] without agoraphobia: Secondary | ICD-10-CM

## 2024-02-10 DIAGNOSIS — F4310 Post-traumatic stress disorder, unspecified: Secondary | ICD-10-CM

## 2024-02-10 MED ORDER — FLUOXETINE HCL 10 MG PO CAPS: 10.0000 mg | ORAL_CAPSULE | Freq: Two times a day (BID) | ORAL | 0 refills | Status: AC

## 2024-02-10 MED ORDER — ALPRAZOLAM 0.5 MG PO TABS: 0.5000 mg | ORAL_TABLET | Freq: Two times a day (BID) | ORAL | 0 refills | Status: AC | PRN

## 2024-02-10 NOTE — Progress Notes (Signed)
 Patricia French [id number] 10/11/1975 48 y.o.  Subjective:   Patient ID:  Patricia French is a 47 y.o. (DOB 1975/10/13) female.  Chief Complaint: No chief complaint on file.   HPI Patricia French presents to the office today for follow-up of MDD, GAD, PTSD, panic attacks.   Referred by PCP Gaines Ada - PCP.  Describes mood today as ok. Pleasant. Denies tearfulness. Mood symptoms - reports anxiety and depression with recent break up. Denies irritability. Reports lower interest and motivation. Reports one recent panic attacks. Reports some worry, rumination and over thinking. Reports mood is stable - just sad. Stating I feel like I'm doing ok. Taking medications as prescribed.  Energy levels lower. Active, does not have a regular exercise routine.  Enjoys some usual interests and activities. Single. Lives alone. Has 3 children - 3 grandchildren. Spending time with family. Appetite adequate. Weight stable - 10 pounds - 190 pounds - Qysmia. Sleeping better at night - TMJ wears mouth guard.  Averages 7 hours.  Reports focus and concentration stable. Completing tasks. Managing aspects of household. Works full time for [employer].  Denies SI or HI. Reports a suicide attempt in high school - over dosed on tylenol . Denies AH or VH. Denies self harm. Denies substance use  Previous medication trials:  Xanax , Lorazepam , Wellbutrin , Trintellix   GAD-7    Flowsheet Row Office Visit from 12/14/2023 in [facility] Office Visit from 07/25/2023 in [facility] Office Visit from 07/15/2022 in [facility] Office Visit from 06/01/2022 in [facility]  Total GAD-7 Score 0 2 4 7    PHQ2-9    Flowsheet Row Office Visit from 12/14/2023 in [facility] Office Visit from 07/25/2023 in [facility]  Office Visit from 01/12/2023 in [facility] Office Visit from 07/15/2022 in [facility] Office Visit from 06/01/2022 in [facility]  PHQ-2 Total Score 0 0 0 2 3  PHQ-9 Total Score 0 3 0 2 8   Flowsheet Row ED from 02/02/2023 in [facility] at [facility] ED from 04/28/2022 in [facility] Emergency Department at [facility]  C-SSRS RISK CATEGORY No Risk No Risk     Review of Systems:  Review of Systems  Musculoskeletal:  Negative for gait problem.  Neurological:  Negative for tremors.  Psychiatric/Behavioral:         Please refer to HPI    Medications: I have reviewed the patient's current medications.  Current Outpatient Medications  Medication Sig Dispense Refill   ALPRAZolam  (XANAX ) 0.5 MG tablet Take 1 tablet (0.5 mg total) by mouth 2 (two) times daily as needed for anxiety. alprazolam  0.5 mg tablet 30 tablet 0   Ascorbic Acid (VITAMIN C) 1000 MG tablet Take by mouth.     celecoxib  (CELEBREX ) 100 MG capsule TAKE 1 CAPSULE(100 MG) BY MOUTH TWICE DAILY 180 capsule 0   estradiol (VIVELLE-DOT) 0.075 MG/24HR Place 1 patch onto the skin daily at 6 (six) AM.     FLUoxetine  (PROZAC ) 10 MG capsule Take 1 capsule (10 mg total) by mouth daily. 90 capsule 1   fluticasone (FLONASE) 50 MCG/ACT nasal spray      Lysine 1000 MG TABS Take 1,000 mg by mouth daily.     Magnesium  250 MG TABS Take 1 tablet (250 mg total) by mouth daily.  Take with evening meal 90 tablet 1   Phentermine -Topiramate  ER (QSYMIA ) 7.5-46 MG CP24 Take 1 capsule by mouth daily. 30 capsule 1   progesterone (PROMETRIUM) 100 MG capsule Take 100 mg by mouth daily.     rosuvastatin  (CRESTOR ) 5 MG tablet TAKE 1 TABLET(5 MG) BY MOUTH DAILY 90 tablet 1   traMADol (ULTRAM) 50 MG tablet Take 50 mg by mouth 4 (four) times daily.     valACYclovir (VALTREX) 500 MG tablet Take 500 mg by mouth daily at 6 (six)  AM.     Vitamin D , Ergocalciferol , (DRISDOL ) 1.25 MG (50000 UNIT) CAPS capsule Take 1 capsule by mouth once a week 12 capsule 1   Zinc 20 MG CAPS Take 1 capsule by mouth daily.     No current facility-administered medications for this visit.    Medication Side Effects: None  Allergies: Allergies[1]  Past Medical History:  Diagnosis Date   Allergy In childhood   Anxiety    Arthritis 2023   Depression On and off for more than 10 years   GERD (gastroesophageal reflux disease) More than 10 years ago   Heart murmur N/A   HSV-2 (herpes simplex virus 2) infection    Hyperlipidemia    Otalgia of both ears 01/18/2023    Past Medical History, Surgical history, Social history, and Family history were reviewed and updated as appropriate.   Please see review of systems for further details on the patient's review from today.   Objective:   Physical Exam:  There were no vitals taken for this visit.  Physical Exam Constitutional:      General: She is not in acute distress. Musculoskeletal:        General: No deformity.  Neurological:     Mental Status: She is alert and oriented to person, place, and time.     Coordination: Coordination normal.  Psychiatric:        Attention and Perception: Attention and perception normal. She does not perceive auditory or visual hallucinations.        Mood and Affect: Mood normal. Mood is not anxious or depressed. Affect is not labile, blunt, angry or inappropriate.        Speech: Speech normal.        Behavior: Behavior normal.        Thought Content: Thought content normal. Thought content is not paranoid or delusional. Thought content does not include homicidal or suicidal ideation. Thought content does not include homicidal or suicidal plan.        Cognition and Memory: Cognition and memory normal.        Judgment: Judgment normal.     Comments: Insight intact     Lab Review:     Component Value Date/Time   NA 142 07/25/2023 1008   K 4.7  07/25/2023 1008   CL 102 07/25/2023 1008   CO2 24 07/25/2023 1008   GLUCOSE 80 07/25/2023 1008   GLUCOSE 90 02/02/2023 1709   BUN 13 07/25/2023 1008   CREATININE 0.61 07/25/2023 1008   CALCIUM  9.6 07/25/2023 1008   PROT 6.6 01/24/2024 0916   ALBUMIN 4.6 01/24/2024 0916   AST 25 01/24/2024 0916   ALT 25 01/24/2024 0916   ALKPHOS 93 01/24/2024 0916   BILITOT 0.7 01/24/2024 0916   GFRNONAA >60 02/02/2023 1709       Component Value Date/Time   WBC 6.2 07/25/2023 1008   WBC 11.9 (H) 02/02/2023 1709   RBC 4.79 07/25/2023 1008   RBC 5.13 (  H) 02/02/2023 1709   HGB 14.5 07/25/2023 1008   HCT 43.9 07/25/2023 1008   PLT 308 07/25/2023 1008   MCV 92 07/25/2023 1008   MCH 30.3 07/25/2023 1008   MCH 29.6 02/02/2023 1709   MCHC 33.0 07/25/2023 1008   MCHC 33.5 02/02/2023 1709   RDW 12.1 07/25/2023 1008   LYMPHSABS 2.3 07/25/2023 1008   MONOABS 0.7 10/22/2020 1631   EOSABS 0.1 07/25/2023 1008   BASOSABS 0.0 07/25/2023 1008    No results found for: POCLITH, LITHIUM   No results found for: PHENYTOIN, PHENOBARB, VALPROATE, CBMZ   .res Assessment: Plan:    Plan:  PDMP reviewed  Prozac  10mg  daily to BID for mood symptoms.  RTC 2 months  25 minutes spent dedicated to the care of this patient on the date of this encounter to include pre-visit review of records, ordering of medication, post visit documentation, and face-to-face time with the patient discussing depression, anxiety, panic attacks and PTSD. Discussed continuing current medication regimen.  Patient advised to contact office with any questions, adverse effects, or acute worsening in signs and symptoms.  There are no diagnoses linked to this encounter.   Please see After Visit Summary for patient specific instructions.  Future Appointments  Date Time Provider Department Center  02/10/2024 12:00 PM Jaionna Weisse, Angeline Mattocks, NP [facility] None  03/27/2024  8:20 AM Georgina Speaks, FNP [facility] [phone]   07/25/2024  8:20 AM Georgina Speaks, FNP [facility] [phone] [address]    No orders of the defined types were placed in this encounter.   -------------------------------     [1]  Allergies Allergen Reactions   Penicillins Hives   Porcine (Pork) Protein-Containing Drug Products

## 2024-03-26 ENCOUNTER — Ambulatory Visit: Admitting: Nurse Practitioner

## 2024-03-27 ENCOUNTER — Encounter: Payer: Self-pay | Admitting: Nurse Practitioner

## 2024-03-27 ENCOUNTER — Ambulatory Visit: Admitting: Nurse Practitioner

## 2024-03-27 VITALS — BP 114/70 | HR 86 | Temp 98.6°F | Ht 63.0 in | Wt 181.4 lb

## 2024-03-27 DIAGNOSIS — E66811 Obesity, class 1: Secondary | ICD-10-CM

## 2024-03-27 DIAGNOSIS — Z2821 Immunization not carried out because of patient refusal: Secondary | ICD-10-CM

## 2024-03-27 DIAGNOSIS — Z6832 Body mass index (BMI) 32.0-32.9, adult: Secondary | ICD-10-CM | POA: Diagnosis not present

## 2024-03-27 DIAGNOSIS — E6609 Other obesity due to excess calories: Secondary | ICD-10-CM

## 2024-03-27 MED ORDER — PHENTERMINE-TOPIRAMATE ER 7.5-46 MG PO CP24: 1.0000 | ORAL_CAPSULE | Freq: Every day | ORAL | 1 refills | Status: AC

## 2024-03-27 NOTE — Progress Notes (Unsigned)
 [name], CMA,acting as a neurosurgeon for Gaines Ada, FNP.,have documented all relevant documentation on the behalf of Gaines Ada, FNP,as directed by  Gaines Ada, FNP while in the presence of Gaines Ada, FNP.  Subjective:  Patient ID: Patricia French , female    DOB: 01/22/1976 , 49 y.o.   MRN: [id number]  Chief Complaint  Patient presents with   Obesity    Patient presents today for a WEIGHT follow up, Patient reports compliance with medication. Patient denies any chest pain, SOB, or headaches. Patient has no concerns today.     She is here for her weight check. She is not hungry as much and when she does eat will not eat as much and her portion size has decreased. She is not drinking as many sodas. She is exercising with her stationary bike a few times a week and will walk the track when her son is at practice. She has just signed up for walking [employer].   She does work part time at [employer]    Discussed the use of AI scribe software for clinical note transcription with the patient, who gave verbal consent to proceed.  History of Present Illness Patricia French is a 49 year old female who presents for a weight check.  She has experienced a weight loss of approximately thirteen pounds over the past two months, averaging nearly two pounds per week. She notes a decreased appetite, reduced portion sizes, and a significant reduction in soda consumption, having only had three sodas recently to alleviate headaches associated with caffeine withdrawal.  Her exercise routine includes working part-time at [employer], where she occasionally uses weights. At home, she rides a stationary bike a couple of times a week and walks a mile on Saturdays at her son's ball games. She recently started Tai Chi walking at home, having done it twice so far, and aims for two to three days of exercise per week.  She is currently on a medication regimen with a dosage of 7.5 mg, which she has been on for  two cycles, each lasting six weeks. She has some medication left and plans to use the app for ordering to receive a discount.  Her sleep is generally good, aided by nighttime medications that induce sleepiness. However, if she takes her medication later than usual, she experiences difficulty sleeping. No heart palpitations are reported.  She mentions a previous issue with an area under her nail bed, which has since grown out. A dermatologist attributed it to trauma from nail work and took a picture for documentation.  Past Medical History:  Diagnosis Date   Allergy In childhood   Anxiety    Arthritis 2023   Depression On and off for more than 10 years   GERD (gastroesophageal reflux disease) More than 10 years ago   Heart murmur N/A   HSV-2 (herpes simplex virus 2) infection    Hyperlipidemia    Otalgia of both ears 01/18/2023     Family History  Problem Relation Age of Onset   Diabetes Mother    Hypertension Mother    Hyperlipidemia Mother    Heart disease Mother    Varicose Veins Mother    Anxiety disorder Mother    Arthritis Mother    Heart attack Father    Bipolar disorder Father    Schizophrenia Father    Hyperlipidemia Father    Alzheimer's disease Father    Cancer Maternal Grandmother    Migraines Maternal Grandmother  Hypertension Maternal Grandmother    Alzheimer's disease Paternal Grandmother    Leukemia Paternal Grandfather    Colon cancer Neg Hx    Colon polyps Neg Hx    Esophageal cancer Neg Hx    Rectal cancer Neg Hx    Stomach cancer Neg Hx    Sleep apnea Neg Hx     Current Medications[1]   Allergies[2]   Review of Systems  Constitutional: Negative.   Respiratory: Negative.    Cardiovascular: Negative.   Neurological: Negative.   Psychiatric/Behavioral: Negative.       Today's Vitals   03/27/24 1500  BP: 114/70  Pulse: 86  Temp: 98.6 F (37 C)  TempSrc: Oral  Weight: 181 lb 6.4 oz (82.3 kg)  Height: 5'  3 (1.6 m)  PainSc: 0-No pain   Body mass index is 32.13 kg/m.  Wt Readings from Last 3 Encounters:  03/27/24 181 lb 6.4 oz (82.3 kg)  01/24/24 194 lb 12.8 oz (88.4 kg)  12/14/23 194 lb (88 kg)      Objective:  Physical Exam Vitals and nursing note reviewed.  Constitutional:      General: She is not in acute distress.    Appearance: Normal appearance. She is obese.  Cardiovascular:     Rate and Rhythm: Normal rate and regular rhythm.     Pulses: Normal pulses.     Heart sounds: Normal heart sounds. No murmur heard. Pulmonary:     Effort: Pulmonary effort is normal. No respiratory distress.     Breath sounds: Normal breath sounds. No wheezing.  Skin:    General: Skin is warm and dry.     Capillary Refill: Capillary refill takes less than 2 seconds.     Comments: Has a 1 mm darkened area to left thumb nail, flat.   Neurological:     General: No focal deficit present.     Mental Status: She is alert and oriented to person, place, and time.     Cranial Nerves: No cranial nerve deficit.     Motor: No weakness.  Psychiatric:        Mood and Affect: Mood normal.        Behavior: Behavior normal.        Thought Content: Thought content normal.        Judgment: Judgment normal.      Assessment And Plan:   Assessment & Plan Class 1 obesity due to excess calories with body mass index (BMI) of 32.0 to 32.9 in adult, unspecified whether serious comorbidity present  Herpes zoster vaccination declined  Class 1 obesity due to excess calories without serious comorbidity with body mass index (BMI) of 34.0 to 34.9 in adult   No orders of the defined types were placed in this encounter.   Assessment & Plan Class 1 obesity due to excess calories She lost 13 pounds over 8 weeks, averaging 2 pounds per week. Reports decreased hunger and portion sizes, reduced soda intake, and regular physical activity. Current Phentermine -topiramate  ER (Qsymia ) 7.5 mg effective for weight loss. Plan  to maintain current dosage for steady weight loss. - Continue Phentermine -topiramate  ER (Qsymia ) 7.5 mg daily. - Refilled prescription for Phentermine -topiramate  ER (Qsymia ) 7.5 mg. - Monitor weight and dietary habits. - Encouraged continued physical activity.   Return for 2 months weight check.  Patient was given opportunity to ask questions. Patient verbalized understanding of the plan and was able to repeat key elements of the plan. All questions were answered to their satisfaction.  LILLETTE Gaines Ada, FNP, have reviewed all documentation for this visit. The documentation on 03/27/24 for the exam, diagnosis, procedures, and orders are all accurate and complete.   IF YOU HAVE BEEN REFERRED TO A SPECIALIST, IT MAY TAKE 1-2 WEEKS TO SCHEDULE/PROCESS THE REFERRAL. IF YOU HAVE NOT HEARD FROM US /SPECIALIST IN TWO WEEKS, PLEASE GIVE US  A CALL AT [phone] X [phone].        [1]  Current Outpatient Medications:    ALPRAZolam  (XANAX ) 0.5 MG tablet, Take 1 tablet (0.5 mg total) by mouth 2 (two) times daily as needed for anxiety. alprazolam  0.5 mg tablet, Disp: 30 tablet, Rfl: 0   Ascorbic Acid (VITAMIN C) 1000 MG tablet, Take by mouth., Disp: , Rfl:    celecoxib  (CELEBREX ) 100 MG capsule, TAKE 1 CAPSULE(100 MG) BY MOUTH TWICE DAILY, Disp: 180 capsule, Rfl: 0   estradiol (VIVELLE-DOT) 0.075 MG/24HR, Place 1 patch onto the skin daily at 6 (six) AM., Disp: , Rfl:    FLUoxetine  (PROZAC ) 10 MG capsule, Take 1 capsule (10 mg total) by mouth 2 (two) times daily., Disp: 180 capsule, Rfl: 0   fluticasone (FLONASE) 50 MCG/ACT nasal spray, , Disp: , Rfl:    Lysine 1000 MG TABS, Take 1,000 mg by mouth daily., Disp: , Rfl:    Magnesium  250 MG TABS, Take 1 tablet (250 mg total) by mouth daily. Take with evening meal, Disp: 90 tablet, Rfl: 1   Phentermine -Topiramate  ER (QSYMIA ) 7.5-46 MG CP24, Take 1 capsule by mouth daily., Disp: 30 capsule, Rfl: 1   progesterone (PROMETRIUM) 100 MG capsule, Take  100 mg by mouth daily., Disp: , Rfl:    rosuvastatin  (CRESTOR ) 5 MG tablet, TAKE 1 TABLET(5 MG) BY MOUTH DAILY, Disp: 90 tablet, Rfl: 1   traMADol (ULTRAM) 50 MG tablet, Take 50 mg by mouth 4 (four) times daily., Disp: , Rfl:    valACYclovir (VALTREX) 500 MG tablet, Take 500 mg by mouth daily at 6 (six) AM., Disp: , Rfl:    Vitamin D , Ergocalciferol , (DRISDOL ) 1.25 MG (50000 UNIT) CAPS capsule, Take 1 capsule by mouth once a week, Disp: 12 capsule, Rfl: 1   Zinc 20 MG CAPS, Take 1 capsule by mouth daily., Disp: , Rfl:  [2] Allergies Allergen Reactions   Penicillins Hives   Porcine (Pork) Protein-Containing Drug Products

## 2024-03-28 NOTE — Assessment & Plan Note (Signed)
 She lost 13 pounds over 8 weeks, averaging 2 pounds per week. Reports decreased hunger and portion sizes, reduced soda intake, and regular physical activity. Current Phentermine -topiramate  ER (Qsymia ) 7.5 mg effective for weight loss. Plan to maintain current dosage for steady weight loss. - Continue Phentermine -topiramate  ER (Qsymia ) 7.5 mg daily. - Refilled prescription for Phentermine -topiramate  ER (Qsymia ) 7.5 mg. - Monitor weight and dietary habits. - Encouraged continued physical activity.

## 2024-03-30 ENCOUNTER — Encounter: Payer: Self-pay | Admitting: Nurse Practitioner

## 2024-03-30 ENCOUNTER — Encounter: Payer: Self-pay | Admitting: Family Medicine

## 2024-03-30 ENCOUNTER — Telehealth: Admitting: Family Medicine

## 2024-03-30 DIAGNOSIS — R5383 Other fatigue: Secondary | ICD-10-CM

## 2024-03-30 DIAGNOSIS — H9203 Otalgia, bilateral: Secondary | ICD-10-CM

## 2024-03-30 DIAGNOSIS — Z20828 Contact with and (suspected) exposure to other viral communicable diseases: Secondary | ICD-10-CM

## 2024-03-30 MED ORDER — OSELTAMIVIR PHOSPHATE 75 MG PO CAPS: 75.0000 mg | ORAL_CAPSULE | Freq: Two times a day (BID) | ORAL | 0 refills | Status: AC

## 2024-03-30 NOTE — Progress Notes (Signed)
 "  Virtual Visit via Video Note  Patricia French, CMA,acting as a scribe for Bruna Creighton, NP.,have documented all relevant documentation on the behalf of Bruna Creighton, NP,as directed by  Bruna Creighton, NP while in the presence of Bruna Creighton, NP.  I connected with Patricia French on 03/30/24 at  9:40 AM EST by a video enabled telemedicine application and verified that I am speaking with the correct person using two identifiers.  Patient Location: Home Provider Location: Office/Clinic  I discussed the limitations, risks, security, and privacy concerns of performing an evaluation and management service by video and the availability of in person appointments. I also discussed with the patient that there may be a patient responsible charge related to this service. The patient expressed understanding and agreed to proceed.  Subjective: PCP: Georgina Speaks, FNP  Chief Complaint  Patient presents with   Headache    Patient presents today for ear pain, throat itch, and headache. Patient reports symptoms started today after taking care of her son that was diagnosed with Flu B on Wednesday. Patient reports she also has fatigue and feels like she is coming down with something.    Patricia French is a 49 year old female who presents with symptoms suggestive of influenza exposure.  Symptoms began two days ago with a sore throat, pain behind the ears, and fatigue. She also has rhinorrhea and feels like she is 'starting to get sick'. No fever or body aches have developed yet, but she primarily feels tired.  She has taken DayQuil Cold and Flu, using the nighttime formulation last night and the daytime formulation this morning. She has had the flu before and received the flu shot this season.  Her 67 year old son has been very sick with the flu despite having received the flu shot. He has experienced high fever, extreme fatigue, lack of appetite, and has been mostly bedridden, only getting up to use  the bathroom. He has been treated with Tamiflu .  She lives alone with her son   Discussed the use of AI scribe software for clinical note transcription with the patient, who gave verbal consent to proceed.  History of Present Illness      ROS: Per HPI    Physical Exam HENT:     Head: Normocephalic.  Neurological:     Mental Status: She is alert and oriented to person, place, and time.     Assessment and Plan: Assessment & Plan Exposure to the flu - Prescribed an antiviral for 5 days for prophylaxis and treatment. - Instructed to confirm Xofluza availability with pharmacy. - Advised to send MyChart message if unavailable for alternatives. - Recommended Tylenol  for fever or body aches if symptoms develop. - Encouraged increased fluid intake and rest.        Follow Up Instructions: The patient was advised to call back or seek an in-person evaluation if the symptoms worsen or if the condition fails to improve as anticipated.     I discussed the assessment and treatment plan with the patient. The patient was provided an opportunity to ask questions, and all were answered. The patient agreed with the plan and demonstrated an understanding of the instructions.    The above assessment and management plan was discussed with the patient. The patient verbalized understanding of and has agreed to the management plan.   I, Bruna Creighton, NP, have reviewed all documentation for this visit. The documentation on 03/30/2024 for the exam, diagnosis, procedures, and orders are all accurate  and complete.     "

## 2024-04-13 ENCOUNTER — Ambulatory Visit (INDEPENDENT_AMBULATORY_CARE_PROVIDER_SITE_OTHER): Admitting: Adult Health

## 2024-05-30 ENCOUNTER — Ambulatory Visit: Payer: Self-pay | Admitting: Nurse Practitioner

## 2024-07-25 ENCOUNTER — Encounter: Payer: Self-pay | Admitting: Nurse Practitioner
